# Patient Record
Sex: Female | Born: 1945 | Race: White | Hispanic: No | Smoking: Never smoker
Health system: Southern US, Community
[De-identification: ages and names within clinical notes are randomized; demographics above are authoritative.]

## PROBLEM LIST (undated history)

## (undated) DIAGNOSIS — N6019 Diffuse cystic mastopathy of unspecified breast: Secondary | ICD-10-CM

## (undated) DIAGNOSIS — I10 Essential (primary) hypertension: Secondary | ICD-10-CM

## (undated) DIAGNOSIS — T7840XA Allergy, unspecified, initial encounter: Secondary | ICD-10-CM

## (undated) DIAGNOSIS — C801 Malignant (primary) neoplasm, unspecified: Secondary | ICD-10-CM

## (undated) DIAGNOSIS — N3281 Overactive bladder: Secondary | ICD-10-CM

## (undated) DIAGNOSIS — G43909 Migraine, unspecified, not intractable, without status migrainosus: Secondary | ICD-10-CM

## (undated) DIAGNOSIS — E7800 Pure hypercholesterolemia, unspecified: Secondary | ICD-10-CM

## (undated) DIAGNOSIS — R32 Unspecified urinary incontinence: Secondary | ICD-10-CM

## (undated) DIAGNOSIS — M1990 Unspecified osteoarthritis, unspecified site: Secondary | ICD-10-CM

## (undated) HISTORY — DX: Overactive bladder: N32.81

## (undated) HISTORY — DX: Malignant (primary) neoplasm, unspecified: C80.1

## (undated) HISTORY — DX: Allergy, unspecified, initial encounter: T78.40XA

## (undated) HISTORY — DX: Diffuse cystic mastopathy of unspecified breast: N60.19

## (undated) HISTORY — DX: Essential (primary) hypertension: I10

## (undated) HISTORY — DX: Pure hypercholesterolemia, unspecified: E78.00

## (undated) HISTORY — DX: Unspecified osteoarthritis, unspecified site: M19.90

## (undated) HISTORY — DX: Migraine, unspecified, not intractable, without status migrainosus: G43.909

## (undated) HISTORY — DX: Unspecified urinary incontinence: R32

---

## 1989-07-18 DIAGNOSIS — I10 Essential (primary) hypertension: Secondary | ICD-10-CM

## 1989-07-18 HISTORY — DX: Essential (primary) hypertension: I10

## 1993-05-31 HISTORY — PX: BREAST BIOPSY: SHX20

## 1996-11-17 HISTORY — PX: LITHOTRIPSY: SUR834

## 1996-11-17 HISTORY — PX: KIDNEY SURGERY: SHX687

## 2004-11-17 HISTORY — PX: COLONOSCOPY: SHX174

## 2004-12-16 ENCOUNTER — Ambulatory Visit: Payer: Self-pay | Admitting: General Surgery

## 2004-12-18 LAB — HM COLONOSCOPY: HM COLON: NORMAL

## 2005-06-03 ENCOUNTER — Ambulatory Visit: Payer: Self-pay | Admitting: General Surgery

## 2005-08-22 LAB — HM PAP SMEAR: HM Pap smear: NEGATIVE

## 2006-09-15 ENCOUNTER — Ambulatory Visit: Payer: Self-pay

## 2007-09-28 ENCOUNTER — Ambulatory Visit: Payer: Self-pay

## 2008-11-01 ENCOUNTER — Ambulatory Visit: Payer: Self-pay

## 2009-12-04 ENCOUNTER — Ambulatory Visit: Payer: Self-pay

## 2010-12-11 ENCOUNTER — Ambulatory Visit: Payer: Self-pay

## 2011-11-18 DIAGNOSIS — N6019 Diffuse cystic mastopathy of unspecified breast: Secondary | ICD-10-CM

## 2011-11-18 HISTORY — DX: Diffuse cystic mastopathy of unspecified breast: N60.19

## 2011-12-17 ENCOUNTER — Ambulatory Visit: Payer: Self-pay

## 2012-12-25 ENCOUNTER — Encounter: Payer: Self-pay | Admitting: *Deleted

## 2012-12-25 DIAGNOSIS — N6019 Diffuse cystic mastopathy of unspecified breast: Secondary | ICD-10-CM | POA: Insufficient documentation

## 2012-12-25 DIAGNOSIS — E7800 Pure hypercholesterolemia, unspecified: Secondary | ICD-10-CM | POA: Insufficient documentation

## 2012-12-25 DIAGNOSIS — G43909 Migraine, unspecified, not intractable, without status migrainosus: Secondary | ICD-10-CM | POA: Insufficient documentation

## 2012-12-25 DIAGNOSIS — I10 Essential (primary) hypertension: Secondary | ICD-10-CM | POA: Insufficient documentation

## 2013-02-03 LAB — HM PAP SMEAR: HM PAP: NEGATIVE

## 2013-02-14 ENCOUNTER — Encounter: Payer: Self-pay | Admitting: General Surgery

## 2013-02-15 ENCOUNTER — Ambulatory Visit: Payer: Self-pay | Admitting: General Surgery

## 2013-02-16 ENCOUNTER — Encounter: Payer: Self-pay | Admitting: General Surgery

## 2013-02-28 ENCOUNTER — Ambulatory Visit (INDEPENDENT_AMBULATORY_CARE_PROVIDER_SITE_OTHER): Payer: Medicare Other | Admitting: General Surgery

## 2013-02-28 ENCOUNTER — Other Ambulatory Visit: Payer: Self-pay | Admitting: *Deleted

## 2013-02-28 VITALS — BP 120/80 | HR 80 | Resp 12 | Ht 65.0 in | Wt 291.0 lb

## 2013-02-28 DIAGNOSIS — Z1231 Encounter for screening mammogram for malignant neoplasm of breast: Secondary | ICD-10-CM

## 2013-02-28 DIAGNOSIS — N6019 Diffuse cystic mastopathy of unspecified breast: Secondary | ICD-10-CM

## 2013-02-28 NOTE — Progress Notes (Signed)
Patient ID: Felicia Vargas, female   DOB: 11/22/1945, 67 y.o.   MRN: [id number]  Chief Complaint  Patient presents with  . Follow-up    mammogram    HPI Felicia Vargas is a 67 y.o. female here today for a follow up mammogram 02/15/13 cat 1. Patient states she has no new breast problems.Patient has had a history of FCD. HPI  Past Medical History  Diagnosis Date  . Hypertension 1990's  . Diffuse cystic mastopathy 2013  . Migraines   . High cholesterol     Past Surgical History  Procedure Laterality Date  . Colonoscopy  2006    Dr. Trinda Pascal  . Kidney surgery  1998  . Breast biopsy Right 1994  . Lithotripsy  1998    Family History  Problem Relation Age of Onset  . Lung cancer Father     Social History History  Substance Use Topics  . Smoking status: Never Smoker   . Smokeless tobacco: Not on file  . Alcohol Use: No    No Known Allergies  Current Outpatient Prescriptions  Medication Sig Dispense Refill  . aspirin 81 MG tablet Take 81 mg by mouth daily.      . butalbital-acetaminophen-caffeine (FIORICET WITH CODEINE) 50-325-40-30 MG per capsule Take 1 capsule by mouth every 4 (four) hours as needed for headache.      . calcium carbonate (OS-CAL) 600 MG TABS Take 600 mg by mouth 2 (two) times daily with a meal.      . fluticasone (FLONASE) 50 MCG/ACT nasal spray 50 sprays daily.      Marland Kitchen lisinopril-hydrochlorothiazide (PRINZIDE,ZESTORETIC) 20-25 MG per tablet Take 1 tablet by mouth daily.      . Multiple Vitamin (MULTIVITAMIN) tablet Take 1 tablet by mouth daily.      Marland Kitchen oxybutynin (DITROPAN) 5 MG tablet Take 5 mg by mouth.      . pravastatin (PRAVACHOL) 20 MG tablet Take 20 mg by mouth daily.       No current facility-administered medications for this visit.    Review of Systems Review of Systems  Constitutional: Negative.   Respiratory: Negative.   Cardiovascular: Negative.     Blood pressure 120/80, pulse 80, resp. rate 12, height 5\' 5"  (1.651 m), weight 291 lb  (131.997 kg).  Physical Exam Physical Exam  Constitutional: She appears well-developed and well-nourished.  Eyes: Conjunctivae are normal. No scleral icterus.  Neck: Normal range of motion. Neck supple.  Cardiovascular: Normal rate, regular rhythm and normal heart sounds.   Pulmonary/Chest: Effort normal and breath sounds normal. Right breast exhibits no inverted nipple, no mass, no nipple discharge, no skin change and no tenderness. Left breast exhibits no inverted nipple, no mass, no nipple discharge, no skin change and no tenderness.  Abdominal: Soft. Bowel sounds are normal.  Lymphadenopathy:    She has no cervical adenopathy.    She has no axillary adenopathy.    Data Reviewed Mammogram reviewed-BIRADS 2.   Assessment    Stable exam     Plan    Continue BSE and yearly f/u.        Lynore Coscia G 03/01/2013, 11:46 AM

## 2013-02-28 NOTE — Patient Instructions (Addendum)
Return in one year with bilateral screening mammogram.

## 2013-02-28 NOTE — Progress Notes (Signed)
Patient will be asked to return to the office in one year for a bilateral screening mammogram. This patient wishes to have this completed at [facility].

## 2013-03-01 ENCOUNTER — Encounter: Payer: Self-pay | Admitting: General Surgery

## 2014-02-16 ENCOUNTER — Ambulatory Visit: Payer: Self-pay | Admitting: General Surgery

## 2014-02-27 ENCOUNTER — Ambulatory Visit: Payer: Medicare Other | Admitting: General Surgery

## 2014-02-27 ENCOUNTER — Ambulatory Visit (INDEPENDENT_AMBULATORY_CARE_PROVIDER_SITE_OTHER): Payer: Medicare Other | Admitting: General Surgery

## 2014-02-27 ENCOUNTER — Encounter: Payer: Self-pay | Admitting: General Surgery

## 2014-02-27 VITALS — BP 138/80 | HR 72 | Resp 18 | Ht 66.0 in | Wt 284.0 lb

## 2014-02-27 DIAGNOSIS — N6019 Diffuse cystic mastopathy of unspecified breast: Secondary | ICD-10-CM

## 2014-02-27 DIAGNOSIS — Z1231 Encounter for screening mammogram for malignant neoplasm of breast: Secondary | ICD-10-CM

## 2014-02-27 NOTE — Patient Instructions (Addendum)
Patient to return in one year bilateral screening mammogram .Continue self breast exams. Call office for any new breast issues or concerns.  

## 2014-02-27 NOTE — Progress Notes (Signed)
Patient ID: Felicia Vargas, female   DOB: 12-07-1945, 68 y.o.   MRN: [id number]  Chief Complaint  Patient presents with  . Follow-up    mammogram    HPI Felicia Vargas is a 68 y.o. female who presents for a breast evaluation. The most recent mammogram was done on 02/16/14. Patient does perform regular self breast checks and gets regular mammograms done.  The patient denies any new problems with the breasts at this time.   HPI. History of FCD. Past Medical History  Diagnosis Date  . Hypertension 1990's  . Diffuse cystic mastopathy 2013  . Migraines   . High cholesterol     Past Surgical History  Procedure Laterality Date  . Colonoscopy  2006    Dr. Chauncey Reading  . Kidney surgery  1998  . Breast biopsy Right 1994  . Lithotripsy  1998    Family History  Problem Relation Age of Onset  . Lung cancer Father     Social History History  Substance Use Topics  . Smoking status: Never Smoker   . Smokeless tobacco: Not on file  . Alcohol Use: No    No Known Allergies  Current Outpatient Prescriptions  Medication Sig Dispense Refill  . aspirin 81 MG tablet Take 81 mg by mouth daily.      . butalbital-acetaminophen-caffeine (FIORICET WITH CODEINE) 50-325-40-30 MG per capsule Take 1 capsule by mouth every 4 (four) hours as needed for headache.      . calcium carbonate (OS-CAL) 600 MG TABS Take 600 mg by mouth 2 (two) times daily with a meal.      . Cholecalciferol (VITAMIN D PO) Take 1 capsule by mouth daily.      . fluticasone (FLONASE) 50 MCG/ACT nasal spray 50 sprays daily.      Marland Kitchen lisinopril-hydrochlorothiazide (PRINZIDE,ZESTORETIC) 20-25 MG per tablet Take 1 tablet by mouth daily.      Marland Kitchen loratadine (CLARITIN) 10 MG tablet Take 10 mg by mouth daily.      . Multiple Vitamin (MULTIVITAMIN) tablet Take 1 tablet by mouth daily.      . naproxen sodium (ANAPROX) 220 MG tablet Take 220 mg by mouth 2 (two) times daily with a meal.      . oxybutynin (DITROPAN) 5 MG tablet Take 5 mg by  mouth.      . pravastatin (PRAVACHOL) 20 MG tablet Take 20 mg by mouth daily.       No current facility-administered medications for this visit.    Review of Systems Review of Systems  Constitutional: Negative.   Respiratory: Negative.   Cardiovascular: Negative.     Blood pressure 138/80, pulse 72, resp. rate 18, height 5\' 6"  (1.676 m), weight 284 lb (128.822 kg).  Physical Exam Physical Exam  Constitutional: She is oriented to person, place, and time. She appears well-developed and well-nourished.  Eyes: Conjunctivae are normal.  Neck: Neck supple.  Cardiovascular: Normal rate, regular rhythm and normal heart sounds.   Pulmonary/Chest: Breath sounds normal. Right breast exhibits no inverted nipple, no mass, no nipple discharge, no skin change and no tenderness. Left breast exhibits no inverted nipple, no mass, no nipple discharge, no skin change and no tenderness.  Abdominal: Bowel sounds are normal. There is no tenderness.  Lymphadenopathy:    She has no cervical adenopathy.    She has no axillary adenopathy.  Neurological: She is alert and oriented to person, place, and time.  Skin: Skin is warm and dry.    Data Reviewed Mammogram  reviewed    Assessment    Stable exam   Hstory of FCD Plan    Patient to return in one year with bilateral screening mammogram.       Faron Tudisco G Shephanie Romas 02/28/2014, 6:37 PM

## 2014-02-28 ENCOUNTER — Encounter: Payer: Self-pay | Admitting: General Surgery

## 2014-09-18 ENCOUNTER — Encounter: Payer: Self-pay | Admitting: General Surgery

## 2015-02-20 ENCOUNTER — Ambulatory Visit
Admit: 2015-02-20 | Disposition: A | Discharge status: Home / Self Care | Payer: Self-pay | Attending: General Surgery | Admitting: General Surgery

## 2015-02-21 ENCOUNTER — Encounter: Payer: Self-pay | Admitting: General Surgery

## 2015-02-27 ENCOUNTER — Encounter: Payer: Self-pay | Admitting: General Surgery

## 2015-02-27 ENCOUNTER — Ambulatory Visit: Payer: Self-pay | Admitting: General Surgery

## 2015-02-27 ENCOUNTER — Ambulatory Visit (INDEPENDENT_AMBULATORY_CARE_PROVIDER_SITE_OTHER): Payer: PPO | Admitting: General Surgery

## 2015-02-27 VITALS — BP 142/68 | HR 74 | Resp 12 | Ht 66.0 in | Wt 282.0 lb

## 2015-02-27 DIAGNOSIS — N6019 Diffuse cystic mastopathy of unspecified breast: Secondary | ICD-10-CM | POA: Diagnosis not present

## 2015-02-27 DIAGNOSIS — Z1211 Encounter for screening for malignant neoplasm of colon: Secondary | ICD-10-CM

## 2015-02-27 NOTE — Patient Instructions (Addendum)
Patient will be asked to return to the office in one year with a bilateral screening mammogram. Continue self breast exams. Call office for any new breast issues or concerns. Colonoscopy A colonoscopy is an exam to look at the entire large intestine (colon). This exam can help find problems such as tumors, polyps, inflammation, and areas of bleeding. The exam takes about 1 hour.  LET [facility] CARE PROVIDER KNOW ABOUT:   Any allergies you have.  All medicines you are taking, including vitamins, herbs, eye drops, creams, and over-the-counter medicines.  Previous problems you or members of your family have had with the use of anesthetics.  Any blood disorders you have.  Previous surgeries you have had.  Medical conditions you have. RISKS AND COMPLICATIONS  Generally, this is a safe procedure. However, as with any procedure, complications can occur. Possible complications include:  Bleeding.  Tearing or rupture of the colon wall.  Reaction to medicines given during the exam.  Infection (rare). BEFORE THE PROCEDURE   Ask your health care provider about changing or stopping your regular medicines.  You may be prescribed an oral bowel prep. This involves drinking a large amount of medicated liquid, starting the day before your procedure. The liquid will cause you to have multiple loose stools until your stool is almost clear or light green. This cleans out your colon in preparation for the procedure.  Do not eat or drink anything else once you have started the bowel prep, unless your health care provider tells you it is safe to do so.  Arrange for someone to drive you home after the procedure. PROCEDURE   You will be given medicine to help you relax (sedative).  You will lie on your side with your knees bent.  A long, flexible tube with a light and camera on the end (colonoscope) will be inserted through the rectum and into the colon. The camera sends video back to a computer  screen as it moves through the colon. The colonoscope also releases carbon dioxide gas to inflate the colon. This helps your health care provider see the area better.  During the exam, your health care provider may take a small tissue sample (biopsy) to be examined under a microscope if any abnormalities are found.  The exam is finished when the entire colon has been viewed. AFTER THE PROCEDURE   Do not drive for 24 hours after the exam.  You may have a small amount of blood in your stool.  You may pass moderate amounts of gas and have mild abdominal cramping or bloating. This is caused by the gas used to inflate your colon during the exam.  Ask when your test results will be ready and how you will get your results. Make sure you get your test results. Document Released: 10/31/2000 Document Revised: 08/24/2013 Document Reviewed: 07/11/2013 [facility] Patient Information 2015 [place], [place]. This information is not intended to replace advice given to you by your health care provider. Make sure you discuss any questions you have with your health care provider.

## 2015-02-27 NOTE — Progress Notes (Signed)
Patient ID: Felicia Vargas, female   DOB: August 18, 1946, 69 y.o.   MRN: [id number]  Chief Complaint  Patient presents with  . Follow-up    mammogram    HPI Felicia Vargas is a 69 y.o. female who presents for a breast evaluation. The most recent mammogram was done on 02/16/15.  Patient does perform regular self breast checks and gets regular mammograms done.    HPI  Past Medical History  Diagnosis Date  . Hypertension 1990's  . Diffuse cystic mastopathy 2013  . Migraines   . High cholesterol     Past Surgical History  Procedure Laterality Date  . Colonoscopy  11/2004    Dr. Chauncey Reading  . Kidney surgery  1998  . Breast biopsy Right 1994  . Lithotripsy  1998    Family History  Problem Relation Age of Onset  . Lung cancer Father     Social History History  Substance Use Topics  . Smoking status: Never Smoker   . Smokeless tobacco: Not on file  . Alcohol Use: No    No Known Allergies  Current Outpatient Prescriptions  Medication Sig Dispense Refill  . aspirin 81 MG tablet Take 81 mg by mouth daily.    . butalbital-acetaminophen-caffeine (FIORICET WITH CODEINE) 50-325-40-30 MG per capsule Take 1 capsule by mouth every 4 (four) hours as needed for headache.    . calcium carbonate (OS-CAL) 600 MG TABS Take 600 mg by mouth 2 (two) times daily with a meal.    . Cholecalciferol (VITAMIN D PO) Take 1 capsule by mouth daily.    . cyclobenzaprine (FLEXERIL) 10 MG tablet   1  . fluticasone (FLONASE) 50 MCG/ACT nasal spray 50 sprays daily.    Marland Kitchen lisinopril-hydrochlorothiazide (PRINZIDE,ZESTORETIC) 20-25 MG per tablet Take 1 tablet by mouth daily.    Marland Kitchen loratadine (CLARITIN) 10 MG tablet Take 10 mg by mouth daily.    . Multiple Vitamin (MULTIVITAMIN) tablet Take 1 tablet by mouth daily.    . naproxen sodium (ANAPROX) 220 MG tablet Take 220 mg by mouth 2 (two) times daily with a meal.    . oxybutynin (DITROPAN) 5 MG tablet Take 5 mg by mouth.    . pravastatin (PRAVACHOL) 20 MG  tablet Take 20 mg by mouth daily.     No current facility-administered medications for this visit.    Review of Systems Review of Systems  Constitutional: Negative.   Respiratory: Negative.   Cardiovascular: Negative.     Blood pressure 142/68, pulse 74, resp. rate 12, height 5\' 6"  (1.676 m), weight 282 lb (127.914 kg).  Physical Exam Physical Exam  Constitutional: She is oriented to person, place, and time. She appears well-developed and well-nourished.  Eyes: Conjunctivae are normal. No scleral icterus.  Neck: Neck supple.  Cardiovascular: Normal rate, regular rhythm and normal heart sounds.   Pulmonary/Chest: Effort normal and breath sounds normal. Right breast exhibits no inverted nipple, no mass, no nipple discharge, no skin change and no tenderness. Left breast exhibits no inverted nipple, no mass, no nipple discharge, no skin change and no tenderness.  Abdominal: Soft. Bowel sounds are normal. There is no tenderness.  Lymphadenopathy:    She has no cervical adenopathy.    She has no axillary adenopathy.  Neurological: She is alert and oriented to person, place, and time.  Skin: Skin is warm and dry.    Data Reviewed Mammogram reviewed   Assessment    Stable exam. History of FCD. Due for screening colonoscopy  Plan    Discussed colonoscopy, patient agrees.   Patient will be asked to return to the office in one year with a bilateral screening mammogram.  PCP:  Mathews Robinsons G 02/27/2015, 3:02 PM

## 2015-02-28 ENCOUNTER — Telehealth: Payer: Self-pay | Admitting: *Deleted

## 2015-02-28 MED ORDER — POLYETHYLENE GLYCOL 3350 17 GM/SCOOP PO POWD: ORAL | Status: DC

## 2015-02-28 NOTE — Telephone Encounter (Signed)
Patient has been scheduled for a colonoscopy on 04-25-15 at [facility]. It is okay for patient to continue 81 mg aspirin once daily.   Miralax prescription has been sent in to patient pharmacy.

## 2015-03-13 ENCOUNTER — Telehealth: Payer: Self-pay

## 2015-03-13 NOTE — Telephone Encounter (Signed)
Patient called to cancel her colonoscopy scheduled for 04/25/15. She will be changing insurances next year and may see about having it rescheduled for then. Trish in Endoscopy notified of cancellation.

## 2015-03-15 ENCOUNTER — Encounter: Payer: Self-pay | Admitting: General Surgery

## 2015-04-25 ENCOUNTER — Encounter: Admission: RE | Payer: Self-pay | Source: Ambulatory Visit

## 2015-04-25 ENCOUNTER — Ambulatory Visit: Admission: RE | Admit: 2015-04-25 | Payer: Self-pay | Source: Ambulatory Visit | Admitting: General Surgery

## 2015-04-25 SURGERY — COLONOSCOPY
Anesthesia: General

## 2015-07-16 ENCOUNTER — Ambulatory Visit (INDEPENDENT_AMBULATORY_CARE_PROVIDER_SITE_OTHER): Payer: PPO | Admitting: Family Medicine

## 2015-07-16 ENCOUNTER — Ambulatory Visit: Payer: Self-pay | Admitting: Family Medicine

## 2015-07-16 ENCOUNTER — Encounter: Payer: Self-pay | Admitting: Family Medicine

## 2015-07-16 VITALS — BP 138/73 | HR 72 | Temp 98.0°F | Ht 64.0 in | Wt 270.6 lb

## 2015-07-16 DIAGNOSIS — E7800 Pure hypercholesterolemia, unspecified: Secondary | ICD-10-CM

## 2015-07-16 DIAGNOSIS — E785 Hyperlipidemia, unspecified: Secondary | ICD-10-CM | POA: Diagnosis not present

## 2015-07-16 DIAGNOSIS — J309 Allergic rhinitis, unspecified: Secondary | ICD-10-CM | POA: Diagnosis not present

## 2015-07-16 DIAGNOSIS — I10 Essential (primary) hypertension: Secondary | ICD-10-CM | POA: Diagnosis not present

## 2015-07-16 MED ORDER — FLUTICASONE PROPIONATE 50 MCG/ACT NA SUSP: 50.0000 | Freq: Every day | NASAL | Status: DC

## 2015-07-16 NOTE — Assessment & Plan Note (Signed)
Under good control. Check microalbumin and CMP early next week as she has eaten today. Continue current regimen. Continue to monitor.

## 2015-07-16 NOTE — Progress Notes (Signed)
BP 138/73 mmHg  Pulse 72  Temp(Src) 98 F (36.7 C)  Ht 5\' 4"  (1.626 m)  Wt 270 lb 9.6 oz (122.743 kg)  BMI 46.43 kg/m2  SpO2 96%   Subjective:    Patient ID: Felicia Vargas, female    DOB: [birth year], 69 y.o.   MRN: [id number]  HPI: Felicia Vargas is a 69 y.o. female who presents today to establish care.   Chief Complaint  Patient presents with  . Allergies  . Hypertension  . Hyperlipidemia   HYPERTENSION / HYPERLIPIDEMIA Satisfied with current treatment? yes Duration of hypertension: chronic BP monitoring frequency: a few times a week BP medication side effects: yes Duration of hyperlipidemia: chronic Cholesterol medication side effects: no Cholesterol supplements: none Medication compliance: excellent compliance Aspirin: yes Recent stressors: no Recurrent headaches: yes- has migraines, has not had one in quite a while Visual changes: no Palpitations: no Dyspnea: no Chest pain: no Lower extremity edema: no Dizzy/lightheaded: no  Relevant past medical, surgical, family and social history reviewed and updated as indicated. Interim medical history since our last visit reviewed. Allergies and medications reviewed and updated.  Review of Systems  Constitutional: Negative.   Respiratory: Negative.   Cardiovascular: Negative.   Skin: Negative.   Psychiatric/Behavioral: Negative.     Per HPI unless specifically indicated above     Objective:    BP 138/73 mmHg  Pulse 72  Temp(Src) 98 F (36.7 C)  Ht 5\' 4"  (1.626 m)  Wt 270 lb 9.6 oz (122.743 kg)  BMI 46.43 kg/m2  SpO2 96%  Wt Readings from Last 3 Encounters:  07/16/15 270 lb 9.6 oz (122.743 kg)  02/27/15 282 lb (127.914 kg)  02/27/14 284 lb (128.822 kg)    Physical Exam  Constitutional: She is oriented to person, place, and time. She appears well-developed and well-nourished. No distress.  HENT:  Head: Normocephalic and atraumatic.  Right Ear: Hearing normal.  Left Ear: Hearing normal.   Nose: Nose normal.  Eyes: Conjunctivae and lids are normal. Right eye exhibits no discharge. Left eye exhibits no discharge. No scleral icterus.  Cardiovascular: Normal rate, regular rhythm, normal heart sounds and intact distal pulses.  Exam reveals no gallop and no friction rub.   No murmur heard. Pulmonary/Chest: Effort normal and breath sounds normal. No respiratory distress. She has no wheezes. She has no rales. She exhibits no tenderness.  Musculoskeletal: Normal range of motion.  Neurological: She is alert and oriented to person, place, and time.  Skin: Skin is warm, dry and intact. No rash noted. No erythema. No pallor.  Psychiatric: She has a normal mood and affect. Her speech is normal and behavior is normal. Judgment and thought content normal. Cognition and memory are normal.  Nursing note and vitals reviewed.   No results found for this or any previous visit.    Assessment & Plan:   Problem List Items Addressed This Visit      Cardiovascular and Mediastinum   Hypertension - Primary    Under good control. Check microalbumin and CMP early next week as she has eaten today. Continue current regimen. Continue to monitor.       Relevant Orders   Comprehensive metabolic panel   Microalbumin, Urine Waived     Respiratory   Rhinitis, allergic    Will refill her flonase today. Under good control. Continue current regimen. Continue to monitor.         Other   High cholesterol    Will check next week  as she has eaten today. Order in. Will adjust as needed. Follow up in 6 months.        Other Visit Diagnoses    HLD (hyperlipidemia)        Relevant Orders    Comprehensive metabolic panel    Lipid Panel Piccolo, Waived        Follow up plan: Return in about 6 months (around 01/15/2016) for Medicare Wellness, next week for lab draw.

## 2015-07-16 NOTE — Assessment & Plan Note (Signed)
Will refill her flonase today. Under good control. Continue current regimen. Continue to monitor.

## 2015-07-16 NOTE — Patient Instructions (Signed)
Preventive Care for Adults A healthy lifestyle and preventive care can promote health and wellness. Preventive health guidelines for women include the following key practices.  A routine yearly physical is a good way to check with your health care provider about your health and preventive screening. It is a chance to share any concerns and updates on your health and to receive a thorough exam.  Visit your dentist for a routine exam and preventive care every 6 months. Brush your teeth twice a day and floss once a day. Good oral hygiene prevents tooth decay and gum disease.  The frequency of eye exams is based on your age, health, family medical history, use of contact lenses, and other factors. Follow your health care provider's recommendations for frequency of eye exams.  Eat a healthy diet. Foods like vegetables, fruits, whole grains, low-fat dairy products, and lean protein foods contain the nutrients you need without too many calories. Decrease your intake of foods high in solid fats, added sugars, and salt. Eat the right amount of calories for you.Get information about a proper diet from your health care provider, if necessary.  Regular physical exercise is one of the most important things you can do for your health. Most adults should get at least 150 minutes of moderate-intensity exercise (any activity that increases your heart rate and causes you to sweat) each week. In addition, most adults need muscle-strengthening exercises on 2 or more days a week.  Maintain a healthy weight. The body mass index (BMI) is a screening tool to identify possible weight problems. It provides an estimate of body fat based on height and weight. Your health care provider can find your BMI and can help you achieve or maintain a healthy weight.For adults 20 years and older:  A BMI below 18.5 is considered underweight.  A BMI of 18.5 to 24.9 is normal.  A BMI of 25 to 29.9 is considered overweight.  A BMI of  30 and above is considered obese.  Maintain normal blood lipids and cholesterol levels by exercising and minimizing your intake of saturated fat. Eat a balanced diet with plenty of fruit and vegetables. Blood tests for lipids and cholesterol should begin at age 76 and be repeated every 5 years. If your lipid or cholesterol levels are high, you are over 50, or you are at high risk for heart disease, you may need your cholesterol levels checked more frequently.Ongoing high lipid and cholesterol levels should be treated with medicines if diet and exercise are not working.  If you smoke, find out from your health care provider how to quit. If you do not use tobacco, do not start.  Lung cancer screening is recommended for adults aged 22-80 years who are at high risk for developing lung cancer because of a history of smoking. A yearly low-dose CT scan of the lungs is recommended for people who have at least a 30-pack-year history of smoking and are a current smoker or have quit within the past 15 years. A pack year of smoking is smoking an average of 1 pack of cigarettes a day for 1 year (for example: 1 pack a day for 30 years or 2 packs a day for 15 years). Yearly screening should continue until the smoker has stopped smoking for at least 15 years. Yearly screening should be stopped for people who develop a health problem that would prevent them from having lung cancer treatment.  If you are pregnant, do not drink alcohol. If you are breastfeeding,  be very cautious about drinking alcohol. If you are not pregnant and choose to drink alcohol, do not have more than 1 drink per day. One drink is considered to be 12 ounces (355 mL) of beer, 5 ounces (148 mL) of wine, or 1.5 ounces (44 mL) of liquor.  Avoid use of street drugs. Do not share needles with anyone. Ask for help if you need support or instructions about stopping the use of drugs.  High blood pressure causes heart disease and increases the risk of  stroke. Your blood pressure should be checked at least every 1 to 2 years. Ongoing high blood pressure should be treated with medicines if weight loss and exercise do not work.  If you are 3-86 years old, ask your health care provider if you should take aspirin to prevent strokes.  Diabetes screening involves taking a blood sample to check your fasting blood sugar level. This should be done once every 3 years, after age 67, if you are within normal weight and without risk factors for diabetes. Testing should be considered at a younger age or be carried out more frequently if you are overweight and have at least 1 risk factor for diabetes.  Breast cancer screening is essential preventive care for women. You should practice "breast self-awareness." This means understanding the normal appearance and feel of your breasts and may include breast self-examination. Any changes detected, no matter how small, should be reported to a health care provider. Women in their 8s and 30s should have a clinical breast exam (CBE) by a health care provider as part of a regular health exam every 1 to 3 years. After age 70, women should have a CBE every year. Starting at age 25, women should consider having a mammogram (breast X-ray test) every year. Women who have a family history of breast cancer should talk to their health care provider about genetic screening. Women at a high risk of breast cancer should talk to their health care providers about having an MRI and a mammogram every year.  Breast cancer gene (BRCA)-related cancer risk assessment is recommended for women who have family members with BRCA-related cancers. BRCA-related cancers include breast, ovarian, tubal, and peritoneal cancers. Having family members with these cancers may be associated with an increased risk for harmful changes (mutations) in the breast cancer genes BRCA1 and BRCA2. Results of the assessment will determine the need for genetic counseling and  BRCA1 and BRCA2 testing.  Routine pelvic exams to screen for cancer are no longer recommended for nonpregnant women who are considered low risk for cancer of the pelvic organs (ovaries, uterus, and vagina) and who do not have symptoms. Ask your health care provider if a screening pelvic exam is right for you.  If you have had past treatment for cervical cancer or a condition that could lead to cancer, you need Pap tests and screening for cancer for at least 20 years after your treatment. If Pap tests have been discontinued, your risk factors (such as having a new sexual partner) need to be reassessed to determine if screening should be resumed. Some women have medical problems that increase the chance of getting cervical cancer. In these cases, your health care provider may recommend more frequent screening and Pap tests.  The HPV test is an additional test that may be used for cervical cancer screening. The HPV test looks for the virus that can cause the cell changes on the cervix. The cells collected during the Pap test can be  tested for HPV. The HPV test could be used to screen women aged 30 years and older, and should be used in women of any age who have unclear Pap test results. After the age of 30, women should have HPV testing at the same frequency as a Pap test.  Colorectal cancer can be detected and often prevented. Most routine colorectal cancer screening begins at the age of 50 years and continues through age 75 years. However, your health care provider may recommend screening at an earlier age if you have risk factors for colon cancer. On a yearly basis, your health care provider may provide home test kits to check for hidden blood in the stool. Use of a small camera at the end of a tube, to directly examine the colon (sigmoidoscopy or colonoscopy), can detect the earliest forms of colorectal cancer. Talk to your health care provider about this at age 50, when routine screening begins. Direct  exam of the colon should be repeated every 5-10 years through age 75 years, unless early forms of pre-cancerous polyps or small growths are found.  People who are at an increased risk for hepatitis B should be screened for this virus. You are considered at high risk for hepatitis B if:  You were born in a country where hepatitis B occurs often. Talk with your health care provider about which countries are considered high risk.  Your parents were born in a high-risk country and you have not received a shot to protect against hepatitis B (hepatitis B vaccine).  You have HIV or AIDS.  You use needles to inject street drugs.  You live with, or have sex with, someone who has hepatitis B.  You get hemodialysis treatment.  You take certain medicines for conditions like cancer, organ transplantation, and autoimmune conditions.  Hepatitis C blood testing is recommended for all people born from 1945 through 1965 and any individual with known risks for hepatitis C.  Practice safe sex. Use condoms and avoid high-risk sexual practices to reduce the spread of sexually transmitted infections (STIs). STIs include gonorrhea, chlamydia, syphilis, trichomonas, herpes, HPV, and human immunodeficiency virus (HIV). Herpes, HIV, and HPV are viral illnesses that have no cure. They can result in disability, cancer, and death.  You should be screened for sexually transmitted illnesses (STIs) including gonorrhea and chlamydia if:  You are sexually active and are younger than 24 years.  You are older than 24 years and your health care provider tells you that you are at risk for this type of infection.  Your sexual activity has changed since you were last screened and you are at an increased risk for chlamydia or gonorrhea. Ask your health care provider if you are at risk.  If you are at risk of being infected with HIV, it is recommended that you take a prescription medicine daily to prevent HIV infection. This is  called preexposure prophylaxis (PrEP). You are considered at risk if:  You are a heterosexual woman, are sexually active, and are at increased risk for HIV infection.  You take drugs by injection.  You are sexually active with a partner who has HIV.  Talk with your health care provider about whether you are at high risk of being infected with HIV. If you choose to begin PrEP, you should first be tested for HIV. You should then be tested every 3 months for as long as you are taking PrEP.  Osteoporosis is a disease in which the bones lose minerals and strength   with aging. This can result in serious bone fractures or breaks. The risk of osteoporosis can be identified using a bone density scan. Women ages 65 years and over and women at risk for fractures or osteoporosis should discuss screening with their health care providers. Ask your health care provider whether you should take a calcium supplement or vitamin D to reduce the rate of osteoporosis.  Menopause can be associated with physical symptoms and risks. Hormone replacement therapy is available to decrease symptoms and risks. You should talk to your health care provider about whether hormone replacement therapy is right for you.  Use sunscreen. Apply sunscreen liberally and repeatedly throughout the day. You should seek shade when your shadow is shorter than you. Protect yourself by wearing long sleeves, pants, a wide-brimmed hat, and sunglasses year round, whenever you are outdoors.  Once a month, do a whole body skin exam, using a mirror to look at the skin on your back. Tell your health care provider of new moles, moles that have irregular borders, moles that are larger than a pencil eraser, or moles that have changed in shape or color.  Stay current with required vaccines (immunizations).  Influenza vaccine. All adults should be immunized every year.  Tetanus, diphtheria, and acellular pertussis (Td, Tdap) vaccine. Pregnant women should  receive 1 dose of Tdap vaccine during each pregnancy. The dose should be obtained regardless of the length of time since the last dose. Immunization is preferred during the 27th-36th week of gestation. An adult who has not previously received Tdap or who does not know her vaccine status should receive 1 dose of Tdap. This initial dose should be followed by tetanus and diphtheria toxoids (Td) booster doses every 10 years. Adults with an unknown or incomplete history of completing a 3-dose immunization series with Td-containing vaccines should begin or complete a primary immunization series including a Tdap dose. Adults should receive a Td booster every 10 years.  Varicella vaccine. An adult without evidence of immunity to varicella should receive 2 doses or a second dose if she has previously received 1 dose. Pregnant females who do not have evidence of immunity should receive the first dose after pregnancy. This first dose should be obtained before leaving the health care facility. The second dose should be obtained 4-8 weeks after the first dose.  Human papillomavirus (HPV) vaccine. Females aged 13-26 years who have not received the vaccine previously should obtain the 3-dose series. The vaccine is not recommended for use in pregnant females. However, pregnancy testing is not needed before receiving a dose. If a female is found to be pregnant after receiving a dose, no treatment is needed. In that case, the remaining doses should be delayed until after the pregnancy. Immunization is recommended for any person with an immunocompromised condition through the age of 26 years if she did not get any or all doses earlier. During the 3-dose series, the second dose should be obtained 4-8 weeks after the first dose. The third dose should be obtained 24 weeks after the first dose and 16 weeks after the second dose.  Zoster vaccine. One dose is recommended for adults aged 60 years or older unless certain conditions are  present.  Measles, mumps, and rubella (MMR) vaccine. Adults born before 1957 generally are considered immune to measles and mumps. Adults born in 1957 or later should have 1 or more doses of MMR vaccine unless there is a contraindication to the vaccine or there is laboratory evidence of immunity to   each of the three diseases. A routine second dose of MMR vaccine should be obtained at least 28 days after the first dose for students attending postsecondary schools, health care workers, or international travelers. People who received inactivated measles vaccine or an unknown type of measles vaccine during 1963-1967 should receive 2 doses of MMR vaccine. People who received inactivated mumps vaccine or an unknown type of mumps vaccine before 1979 and are at high risk for mumps infection should consider immunization with 2 doses of MMR vaccine. For females of childbearing age, rubella immunity should be determined. If there is no evidence of immunity, females who are not pregnant should be vaccinated. If there is no evidence of immunity, females who are pregnant should delay immunization until after pregnancy. Unvaccinated health care workers born before 1957 who lack laboratory evidence of measles, mumps, or rubella immunity or laboratory confirmation of disease should consider measles and mumps immunization with 2 doses of MMR vaccine or rubella immunization with 1 dose of MMR vaccine.  Pneumococcal 13-valent conjugate (PCV13) vaccine. When indicated, a person who is uncertain of her immunization history and has no record of immunization should receive the PCV13 vaccine. An adult aged 19 years or older who has certain medical conditions and has not been previously immunized should receive 1 dose of PCV13 vaccine. This PCV13 should be followed with a dose of pneumococcal polysaccharide (PPSV23) vaccine. The PPSV23 vaccine dose should be obtained at least 8 weeks after the dose of PCV13 vaccine. An adult aged 19  years or older who has certain medical conditions and previously received 1 or more doses of PPSV23 vaccine should receive 1 dose of PCV13. The PCV13 vaccine dose should be obtained 1 or more years after the last PPSV23 vaccine dose.  Pneumococcal polysaccharide (PPSV23) vaccine. When PCV13 is also indicated, PCV13 should be obtained first. All adults aged 65 years and older should be immunized. An adult younger than age 65 years who has certain medical conditions should be immunized. Any person who resides in a nursing home or long-term care facility should be immunized. An adult smoker should be immunized. People with an immunocompromised condition and certain other conditions should receive both PCV13 and PPSV23 vaccines. People with human immunodeficiency virus (HIV) infection should be immunized as soon as possible after diagnosis. Immunization during chemotherapy or radiation therapy should be avoided. Routine use of PPSV23 vaccine is not recommended for American Indians, [place] Natives, or people younger than 65 years unless there are medical conditions that require PPSV23 vaccine. When indicated, people who have unknown immunization and have no record of immunization should receive PPSV23 vaccine. One-time revaccination 5 years after the first dose of PPSV23 is recommended for people aged 19-64 years who have chronic kidney failure, nephrotic syndrome, asplenia, or immunocompromised conditions. People who received 1-2 doses of PPSV23 before age 65 years should receive another dose of PPSV23 vaccine at age 65 years or later if at least 5 years have passed since the previous dose. Doses of PPSV23 are not needed for people immunized with PPSV23 at or after age 65 years.  Meningococcal vaccine. Adults with asplenia or persistent complement component deficiencies should receive 2 doses of quadrivalent meningococcal conjugate (MenACWY-D) vaccine. The doses should be obtained at least 2 months apart.  Microbiologists working with certain meningococcal bacteria, military recruits, people at risk during an outbreak, and people who travel to or live in countries with a high rate of meningitis should be immunized. A first-year college student up through age   21 years who is living in a residence hall should receive a dose if she did not receive a dose on or after her 16th birthday. Adults who have certain high-risk conditions should receive one or more doses of vaccine.  Hepatitis A vaccine. Adults who wish to be protected from this disease, have certain high-risk conditions, work with hepatitis A-infected animals, work in hepatitis A research labs, or travel to or work in countries with a high rate of hepatitis A should be immunized. Adults who were previously unvaccinated and who anticipate close contact with an international adoptee during the first 60 days after arrival in the [place] from a country with a high rate of hepatitis A should be immunized.  Hepatitis B vaccine. Adults who wish to be protected from this disease, have certain high-risk conditions, may be exposed to blood or other infectious body fluids, are household contacts or sex partners of hepatitis B positive people, are clients or workers in certain care facilities, or travel to or work in countries with a high rate of hepatitis B should be immunized.  Haemophilus influenzae type b (Hib) vaccine. A previously unvaccinated person with asplenia or sickle cell disease or having a scheduled splenectomy should receive 1 dose of Hib vaccine. Regardless of previous immunization, a recipient of a hematopoietic stem cell transplant should receive a 3-dose series 6-12 months after her successful transplant. Hib vaccine is not recommended for adults with HIV infection. Preventive Services / Frequency Ages 64 to 68 years  Blood pressure check.** / Every 1 to 2 years.  Lipid and cholesterol check.** / Every 5 years beginning at age  22.  Clinical breast exam.** / Every 3 years for women in their 88s and 53s.  BRCA-related cancer risk assessment.** / For women who have family members with a BRCA-related cancer (breast, ovarian, tubal, or peritoneal cancers).  Pap test.** / Every 2 years from ages 90 through 51. Every 3 years starting at age 21 through age 56 or 3 with a history of 3 consecutive normal Pap tests.  HPV screening.** / Every 3 years from ages 24 through ages 1 to 46 with a history of 3 consecutive normal Pap tests.  Hepatitis C blood test.** / For any individual with known risks for hepatitis C.  Skin self-exam. / Monthly.  Influenza vaccine. / Every year.  Tetanus, diphtheria, and acellular pertussis (Tdap, Td) vaccine.** / Consult your health care provider. Pregnant women should receive 1 dose of Tdap vaccine during each pregnancy. 1 dose of Td every 10 years.  Varicella vaccine.** / Consult your health care provider. Pregnant females who do not have evidence of immunity should receive the first dose after pregnancy.  HPV vaccine. / 3 doses over 6 months, if 72 and younger. The vaccine is not recommended for use in pregnant females. However, pregnancy testing is not needed before receiving a dose.  Measles, mumps, rubella (MMR) vaccine.** / You need at least 1 dose of MMR if you were born in 1957 or later. You may also need a 2nd dose. For females of childbearing age, rubella immunity should be determined. If there is no evidence of immunity, females who are not pregnant should be vaccinated. If there is no evidence of immunity, females who are pregnant should delay immunization until after pregnancy.  Pneumococcal 13-valent conjugate (PCV13) vaccine.** / Consult your health care provider.  Pneumococcal polysaccharide (PPSV23) vaccine.** / 1 to 2 doses if you smoke cigarettes or if you have certain conditions.  Meningococcal vaccine.** /  1 dose if you are age 19 to 21 years and a first-year college  student living in a residence hall, or have one of several medical conditions, you need to get vaccinated against meningococcal disease. You may also need additional booster doses.  Hepatitis A vaccine.** / Consult your health care provider.  Hepatitis B vaccine.** / Consult your health care provider.  Haemophilus influenzae type b (Hib) vaccine.** / Consult your health care provider. Ages 40 to 64 years  Blood pressure check.** / Every 1 to 2 years.  Lipid and cholesterol check.** / Every 5 years beginning at age 20 years.  Lung cancer screening. / Every year if you are aged 55-80 years and have a 30-pack-year history of smoking and currently smoke or have quit within the past 15 years. Yearly screening is stopped once you have quit smoking for at least 15 years or develop a health problem that would prevent you from having lung cancer treatment.  Clinical breast exam.** / Every year after age 40 years.  BRCA-related cancer risk assessment.** / For women who have family members with a BRCA-related cancer (breast, ovarian, tubal, or peritoneal cancers).  Mammogram.** / Every year beginning at age 40 years and continuing for as long as you are in good health. Consult with your health care provider.  Pap test.** / Every 3 years starting at age 30 years through age 65 or 70 years with a history of 3 consecutive normal Pap tests.  HPV screening.** / Every 3 years from ages 30 years through ages 65 to 70 years with a history of 3 consecutive normal Pap tests.  Fecal occult blood test (FOBT) of stool. / Every year beginning at age 50 years and continuing until age 75 years. You may not need to do this test if you get a colonoscopy every 10 years.  Flexible sigmoidoscopy or colonoscopy.** / Every 5 years for a flexible sigmoidoscopy or every 10 years for a colonoscopy beginning at age 50 years and continuing until age 75 years.  Hepatitis C blood test.** / For all people born from 1945 through  1965 and any individual with known risks for hepatitis C.  Skin self-exam. / Monthly.  Influenza vaccine. / Every year.  Tetanus, diphtheria, and acellular pertussis (Tdap/Td) vaccine.** / Consult your health care provider. Pregnant women should receive 1 dose of Tdap vaccine during each pregnancy. 1 dose of Td every 10 years.  Varicella vaccine.** / Consult your health care provider. Pregnant females who do not have evidence of immunity should receive the first dose after pregnancy.  Zoster vaccine.** / 1 dose for adults aged 60 years or older.  Measles, mumps, rubella (MMR) vaccine.** / You need at least 1 dose of MMR if you were born in 1957 or later. You may also need a 2nd dose. For females of childbearing age, rubella immunity should be determined. If there is no evidence of immunity, females who are not pregnant should be vaccinated. If there is no evidence of immunity, females who are pregnant should delay immunization until after pregnancy.  Pneumococcal 13-valent conjugate (PCV13) vaccine.** / Consult your health care provider.  Pneumococcal polysaccharide (PPSV23) vaccine.** / 1 to 2 doses if you smoke cigarettes or if you have certain conditions.  Meningococcal vaccine.** / Consult your health care provider.  Hepatitis A vaccine.** / Consult your health care provider.  Hepatitis B vaccine.** / Consult your health care provider.  Haemophilus influenzae type b (Hib) vaccine.** / Consult your health care provider. Ages 65   years and over  Blood pressure check.** / Every 1 to 2 years.  Lipid and cholesterol check.** / Every 5 years beginning at age 22 years.  Lung cancer screening. / Every year if you are aged 73-80 years and have a 30-pack-year history of smoking and currently smoke or have quit within the past 15 years. Yearly screening is stopped once you have quit smoking for at least 15 years or develop a health problem that would prevent you from having lung cancer  treatment.  Clinical breast exam.** / Every year after age 4 years.  BRCA-related cancer risk assessment.** / For women who have family members with a BRCA-related cancer (breast, ovarian, tubal, or peritoneal cancers).  Mammogram.** / Every year beginning at age 40 years and continuing for as long as you are in good health. Consult with your health care provider.  Pap test.** / Every 3 years starting at age 9 years through age 34 or 91 years with 3 consecutive normal Pap tests. Testing can be stopped between 65 and 70 years with 3 consecutive normal Pap tests and no abnormal Pap or HPV tests in the past 10 years.  HPV screening.** / Every 3 years from ages 57 years through ages 64 or 45 years with a history of 3 consecutive normal Pap tests. Testing can be stopped between 65 and 70 years with 3 consecutive normal Pap tests and no abnormal Pap or HPV tests in the past 10 years.  Fecal occult blood test (FOBT) of stool. / Every year beginning at age 15 years and continuing until age 17 years. You may not need to do this test if you get a colonoscopy every 10 years.  Flexible sigmoidoscopy or colonoscopy.** / Every 5 years for a flexible sigmoidoscopy or every 10 years for a colonoscopy beginning at age 86 years and continuing until age 71 years.  Hepatitis C blood test.** / For all people born from [birth year] through 1963 and any individual with known risks for hepatitis C.  Osteoporosis screening.** / A one-time screening for women ages 83 years and over and women at risk for fractures or osteoporosis.  Skin self-exam. / Monthly.  Influenza vaccine. / Every year.  Tetanus, diphtheria, and acellular pertussis (Tdap/Td) vaccine.** / 1 dose of Td every 10 years.  Varicella vaccine.** / Consult your health care provider.  Zoster vaccine.** / 1 dose for adults aged 61 years or older.  Pneumococcal 13-valent conjugate (PCV13) vaccine.** / Consult your health care provider.  Pneumococcal  polysaccharide (PPSV23) vaccine.** / 1 dose for all adults aged 28 years and older.  Meningococcal vaccine.** / Consult your health care provider.  Hepatitis A vaccine.** / Consult your health care provider.  Hepatitis B vaccine.** / Consult your health care provider.  Haemophilus influenzae type b (Hib) vaccine.** / Consult your health care provider. ** Family history and personal history of risk and conditions may change your health care provider's recommendations. Document Released: 12/30/2001 Document Revised: 03/20/2014 Document Reviewed: 03/31/2011 [facility] Patient Information 2015 [place], [place]. This information is not intended to replace advice given to you by your health care provider. Make sure you discuss any questions you have with your health care provider.

## 2015-07-16 NOTE — Assessment & Plan Note (Signed)
Will check next week as she has eaten today. Order in. Will adjust as needed. Follow up in 6 months.

## 2015-07-25 ENCOUNTER — Other Ambulatory Visit: Payer: PPO

## 2015-07-25 DIAGNOSIS — E785 Hyperlipidemia, unspecified: Secondary | ICD-10-CM

## 2015-07-25 DIAGNOSIS — I10 Essential (primary) hypertension: Secondary | ICD-10-CM

## 2015-07-25 LAB — MICROALBUMIN, URINE WAIVED
CREATININE, URINE WAIVED: 100 mg/dL (ref 10–300)
Microalb, Ur Waived: 10 mg/L (ref 0–19)

## 2015-07-25 LAB — LIPID PANEL PICCOLO, WAIVED
CHOL/HDL RATIO PICCOLO,WAIVE: 3.4 mg/dL
Cholesterol Piccolo, Waived: 162 mg/dL (ref <200)
HDL Chol Piccolo, Waived: 48 mg/dL — ABNORMAL LOW (ref >59)
LDL Chol Calc Piccolo Waived: 100 mg/dL — ABNORMAL HIGH (ref <100)
Triglycerides Piccolo,Waived: 73 mg/dL (ref <150)
VLDL CHOL CALC PICCOLO,WAIVE: 15 mg/dL (ref <30)

## 2015-07-26 LAB — COMPREHENSIVE METABOLIC PANEL
ALBUMIN: 4.1 g/dL (ref 3.6–4.8)
ALK PHOS: 74 IU/L (ref 39–117)
ALT: 26 IU/L (ref 0–32)
AST: 26 IU/L (ref 0–40)
Albumin/Globulin Ratio: 1.6 (ref 1.1–2.5)
BILIRUBIN TOTAL: 0.8 mg/dL (ref 0.0–1.2)
BUN / CREAT RATIO: 27 — AB (ref 11–26)
BUN: 24 mg/dL (ref 8–27)
CHLORIDE: 99 mmol/L (ref 97–108)
CO2: 22 mmol/L (ref 18–29)
Calcium: 9.8 mg/dL (ref 8.7–10.3)
Creatinine, Ser: 0.89 mg/dL (ref 0.57–1.00)
GFR calc Af Amer: 78 mL/min/{1.73_m2} (ref >59)
GFR calc non Af Amer: 67 mL/min/{1.73_m2} (ref >59)
GLUCOSE: 101 mg/dL — AB (ref 65–99)
Globulin, Total: 2.5 g/dL (ref 1.5–4.5)
Potassium: 4.7 mmol/L (ref 3.5–5.2)
SODIUM: 138 mmol/L (ref 134–144)
Total Protein: 6.6 g/dL (ref 6.0–8.5)

## 2015-07-30 ENCOUNTER — Encounter: Payer: Self-pay | Admitting: Family Medicine

## 2015-10-13 ENCOUNTER — Other Ambulatory Visit: Payer: Self-pay | Admitting: Family Medicine

## 2015-12-19 ENCOUNTER — Other Ambulatory Visit: Payer: Self-pay | Admitting: *Deleted

## 2015-12-19 DIAGNOSIS — Z1231 Encounter for screening mammogram for malignant neoplasm of breast: Secondary | ICD-10-CM

## 2015-12-27 ENCOUNTER — Encounter: Payer: Self-pay | Admitting: *Deleted

## 2016-01-10 ENCOUNTER — Other Ambulatory Visit: Payer: Self-pay | Admitting: Family Medicine

## 2016-01-10 NOTE — Telephone Encounter (Signed)
Dr.Johnson, patient is scheduled for 01/18/16 for a medicare wellness, do you want her to have another appointment besides that one?

## 2016-01-10 NOTE — Telephone Encounter (Signed)
Needs appointment

## 2016-01-18 ENCOUNTER — Ambulatory Visit (INDEPENDENT_AMBULATORY_CARE_PROVIDER_SITE_OTHER): Payer: Medicare HMO | Admitting: Family Medicine

## 2016-01-18 ENCOUNTER — Encounter: Payer: Self-pay | Admitting: Family Medicine

## 2016-01-18 VITALS — BP 127/71 | HR 64 | Temp 98.5°F | Ht 64.0 in | Wt 264.0 lb

## 2016-01-18 DIAGNOSIS — Z23 Encounter for immunization: Secondary | ICD-10-CM

## 2016-01-18 DIAGNOSIS — I10 Essential (primary) hypertension: Secondary | ICD-10-CM

## 2016-01-18 DIAGNOSIS — E7800 Pure hypercholesterolemia, unspecified: Secondary | ICD-10-CM

## 2016-01-18 DIAGNOSIS — G43709 Chronic migraine without aura, not intractable, without status migrainosus: Secondary | ICD-10-CM | POA: Diagnosis not present

## 2016-01-18 DIAGNOSIS — Z0000 Encounter for general adult medical examination without abnormal findings: Secondary | ICD-10-CM

## 2016-01-18 DIAGNOSIS — S41102A Unspecified open wound of left upper arm, initial encounter: Secondary | ICD-10-CM

## 2016-01-18 DIAGNOSIS — J309 Allergic rhinitis, unspecified: Secondary | ICD-10-CM | POA: Diagnosis not present

## 2016-01-18 LAB — UA/M W/RFLX CULTURE, ROUTINE
BILIRUBIN UA: NEGATIVE
GLUCOSE, UA: NEGATIVE
KETONES UA: NEGATIVE
Nitrite, UA: NEGATIVE
Protein, UA: NEGATIVE
RBC UA: NEGATIVE
SPEC GRAV UA: 1.02 (ref 1.005–1.030)
UUROB: 0.2 mg/dL (ref 0.2–1.0)
pH, UA: 6 (ref 5.0–7.5)

## 2016-01-18 LAB — MICROALBUMIN, URINE WAIVED
CREATININE, URINE WAIVED: 50 mg/dL (ref 10–300)
Microalb, Ur Waived: 10 mg/L (ref 0–19)
Microalb/Creat Ratio: <30 mg/g (ref <30)

## 2016-01-18 MED ORDER — PRAVASTATIN SODIUM 20 MG PO TABS: 20.0000 mg | ORAL_TABLET | Freq: Every day | ORAL | Status: DC

## 2016-01-18 MED ORDER — CYCLOBENZAPRINE HCL 10 MG PO TABS: 10.0000 mg | ORAL_TABLET | Freq: Every day | ORAL | Status: DC | PRN

## 2016-01-18 MED ORDER — OXYBUTYNIN CHLORIDE 5 MG PO TABS: 5.0000 mg | ORAL_TABLET | Freq: Two times a day (BID) | ORAL | Status: DC

## 2016-01-18 MED ORDER — FLUTICASONE PROPIONATE 50 MCG/ACT NA SUSP: 50.0000 | Freq: Every day | NASAL | Status: DC

## 2016-01-18 MED ORDER — LISINOPRIL-HYDROCHLOROTHIAZIDE 20-25 MG PO TABS: 1.0000 | ORAL_TABLET | Freq: Every day | ORAL | Status: DC

## 2016-01-18 NOTE — Progress Notes (Signed)
BP 127/71 mmHg  Pulse 64  Temp(Src) 98.5 F (36.9 C)  Ht 5\' 4"  (1.626 m)  Wt 264 lb (119.75 kg)  BMI 45.29 kg/m2  SpO2 97%   Subjective:    Patient ID: Felicia Vargas, female    DOB: March 04, 1946, 70 y.o.   MRN: [id number]  HPI: Felicia Vargas is a 70 y.o. female presenting on 01/18/2016 for comprehensive medical examination. Current medical complaints include:  HYPERTENSION / HYPERLIPIDEMIA Satisfied with current treatment? yes Duration of hypertension: chronic BP monitoring frequency: not checking BP medication side effects: no Duration of hyperlipidemia: chronic Cholesterol medication side effects: no Cholesterol supplements: none Medication compliance: good compliance Aspirin: yes Recent stressors: no Recurrent headaches: no Visual changes: no Palpitations: no Dyspnea: no Chest pain: no Lower extremity edema: mild with keeping the legs dependant Dizzy/lightheaded: no  MIGRAINES- doing well. No problems. Hasn't had one.   Functional Ability / Safety Screening 1.  Was the timed Get Up and Go test longer than 30 seconds?  no 2.  Does the patient need help with the phone, transportation, shopping,      preparing meals, housework, laundry, medications, or managing money?  no 3.  Does the patient's home have:  loose throw rugs in the hallway?   no      Grab bars in the bathroom? no      Handrails on the stairs?   yes      Poor lighting?   no 4.  Has the patient noticed any hearing difficulties?   no  Other Doctors:  Dr. Jamal Collin- surgery [facility]- Dr. Kemper Durie  Advanced Directives Does patient have a HCPOA?    yes If yes, name and contact information: Jahaida Dishaw Does patient have a living will or MOST form?  yes  She currently lives with: Menopausal Symptoms: no  Depression Screen done today and results listed below:  Depression screen [facility] 2/9 01/18/2016  Decreased Interest 0  Down, Depressed, Hopeless 0  PHQ - 2 Score 0    The patient does not  have a history of falls.   Past Medical History:  Past Medical History  Diagnosis Date  . Hypertension 1990's  . Diffuse cystic mastopathy 2013  . Migraines   . High cholesterol   . Allergy   . Overactive bladder   . Arthritis   . Migraine     Surgical History:  Past Surgical History  Procedure Laterality Date  . Colonoscopy  11/2004    Dr. Chauncey Reading  . Kidney surgery  1998  . Breast biopsy Right 1994  . Lithotripsy  1998    Medications:  Current Outpatient Prescriptions on File Prior to Visit  Medication Sig  . aspirin 81 MG tablet Take 81 mg by mouth daily.  . butalbital-acetaminophen-caffeine (FIORICET WITH CODEINE) 50-325-40-30 MG per capsule Take 1 capsule by mouth every 4 (four) hours as needed for headache.  . calcium carbonate (OS-CAL) 600 MG TABS Take 600 mg by mouth 2 (two) times daily with a meal.  . Cholecalciferol (VITAMIN D PO) Take 1 capsule by mouth daily.  . Multiple Vitamin (MULTIVITAMIN) tablet Take 1 tablet by mouth daily.   No current facility-administered medications on file prior to visit.    Allergies:  No Known Allergies  Social History:  Social History   Social History  . Marital Status: Widowed    Spouse Name: N/A  . Number of Children: N/A  . Years of Education: N/A   Occupational History  . Not on  file.   Social History Main Topics  . Smoking status: Never Smoker   . Smokeless tobacco: Never Used  . Alcohol Use: No  . Drug Use: No  . Sexual Activity: No   Other Topics Concern  . Not on file   Social History Narrative   History  Smoking status  . Never Smoker   Smokeless tobacco  . Never Used   History  Alcohol Use No    Family History:  Family History  Problem Relation Age of Onset  . Lung cancer Father   . Stroke Mother   . Hypertension Sister   . Arthritis Sister     Past medical history, surgical history, medications, allergies, family history and social history reviewed with patient today and changes made  to appropriate areas of the chart.   Review of Systems  Constitutional: Positive for diaphoresis. Negative for fever, chills, weight loss and malaise/fatigue.  HENT: Negative.   Eyes: Negative.   Respiratory: Negative.   Cardiovascular: Negative.   Gastrointestinal: Positive for heartburn. Negative for nausea, vomiting, abdominal pain, diarrhea, constipation, blood in stool and melena.  Genitourinary: Negative.   Musculoskeletal: Positive for joint pain. Negative for myalgias, back pain, falls and neck pain.  Skin: Negative.   Neurological: Negative.  Negative for weakness.  Endo/Heme/Allergies: Negative for environmental allergies and polydipsia. Bruises/bleeds easily.  Psychiatric/Behavioral: Negative.     All other ROS negative except what is listed above and in the HPI.      Objective:    BP 127/71 mmHg  Pulse 64  Temp(Src) 98.5 F (36.9 C)  Ht 5\' 4"  (1.626 m)  Wt 264 lb (119.75 kg)  BMI 45.29 kg/m2  SpO2 97%  Wt Readings from Last 3 Encounters:  01/18/16 264 lb (119.75 kg)  07/16/15 270 lb 9.6 oz (122.743 kg)  02/27/15 282 lb (127.914 kg)     Hearing Screening   125Hz  250Hz  500Hz  1000Hz  2000Hz  4000Hz  8000Hz   Right ear:   20 20 20 20    Left ear:   20 20 20 20      Visual Acuity Screening   Right eye Left eye Both eyes  Without correction: 20/40 20/30 20/25   With correction:       Physical Exam  Constitutional: She is oriented to person, place, and time. She appears well-developed and well-nourished. No distress.  HENT:  Head: Normocephalic and atraumatic.  Right Ear: Hearing, tympanic membrane, external ear and ear canal normal.  Left Ear: Hearing, tympanic membrane, external ear and ear canal normal.  Nose: Nose normal.  Mouth/Throat: Uvula is midline, oropharynx is clear and moist and mucous membranes are normal. No oropharyngeal exudate.  Eyes: Conjunctivae, EOM and lids are normal. Pupils are equal, round, and reactive to light. Right eye exhibits no  discharge. Left eye exhibits no discharge. No scleral icterus.  Neck: Normal range of motion. Neck supple. No JVD present. No tracheal deviation present. No thyromegaly present.  Cardiovascular: Normal rate, regular rhythm and intact distal pulses.  Exam reveals no gallop and no friction rub.   No murmur heard. Pulmonary/Chest: Effort normal and breath sounds normal. No stridor. No respiratory distress. She has no wheezes. She has no rales. She exhibits no tenderness.  Abdominal: Soft. Bowel sounds are normal. She exhibits no distension and no mass. There is no tenderness. There is no rebound and no guarding.  Genitourinary:  Deferred at patient's request  Musculoskeletal: Normal range of motion. She exhibits no edema or tenderness.  Lymphadenopathy:    She  has no cervical adenopathy.  Neurological: She is alert and oriented to person, place, and time. She has normal reflexes. She displays normal reflexes. No cranial nerve deficit. She exhibits normal muscle tone. Coordination normal.  Skin: Skin is warm, dry and intact. No rash noted. She is not diaphoretic. No erythema. No pallor.  Scratches on L arm, healing well  Psychiatric: She has a normal mood and affect. Her speech is normal and behavior is normal. Judgment and thought content normal. Cognition and memory are normal.  Nursing note and vitals reviewed.   Cognitive Testing - 6-CIT  Correct? Score   What year is it? yes 0 Yes = 0    No = 4  What month is it? yes 0 Yes = 0    No = 3  Remember:     Pia [address]au, [place], [place]     What time is it? yes 0 Yes = 0    No = 3  Count backwards from 20 to 1 yes 0 Correct = 0    1 error = 2   More than 1 error = 4  Say the months of the year in reverse. yes 0 Correct = 0    1 error = 2   More than 1 error = 4  What address did I ask you to remember? yes 0 Correct = 0  1 error = 2    2 error = 4    3 error = 6    4 error = 8    All wrong = 10       TOTAL SCORE  0/28   Interpretation:   Normal  Normal (0-7) Abnormal (8-28)    Results for orders placed or performed in visit on 07/25/15  Comprehensive metabolic panel  Result Value Ref Range   Glucose 101 (H) 65 - 99 mg/dL   BUN 24 8 - 27 mg/dL   Creatinine, Ser 0.89 0.57 - 1.00 mg/dL   GFR calc non Af Amer 67 >59 mL/min/1.73   GFR calc Af Amer 78 >59 mL/min/1.73   BUN/Creatinine Ratio 27 (H) 11 - 26   Sodium 138 134 - 144 mmol/L   Potassium 4.7 3.5 - 5.2 mmol/L   Chloride 99 97 - 108 mmol/L   CO2 22 18 - 29 mmol/L   Calcium 9.8 8.7 - 10.3 mg/dL   Total Protein 6.6 6.0 - 8.5 g/dL   Albumin 4.1 3.6 - 4.8 g/dL   Globulin, Total 2.5 1.5 - 4.5 g/dL   Albumin/Globulin Ratio 1.6 1.1 - 2.5   Bilirubin Total 0.8 0.0 - 1.2 mg/dL   Alkaline Phosphatase 74 39 - 117 IU/L   AST 26 0 - 40 IU/L   ALT 26 0 - 32 IU/L  Microalbumin, Urine Waived  Result Value Ref Range   Microalb, Ur Waived 10 0 - 19 mg/L   Creatinine, Urine Waived 100 10 - 300 mg/dL   Microalb/Creat Ratio <30 <30 mg/g  Lipid Panel Piccolo, Waived  Result Value Ref Range   Cholesterol Piccolo, Waived 162 <200 mg/dL   HDL Chol Piccolo, Waived 48 (L) >59 mg/dL   Triglycerides Piccolo,Waived 73 <150 mg/dL   Chol/HDL Ratio Piccolo,Waive 3.4 mg/dL   LDL Chol Calc Piccolo Waived 100 (H) <100 mg/dL   VLDL Chol Calc Piccolo,Waive 15 <30 mg/dL      Assessment & Plan:   Problem List Items Addressed This Visit      Cardiovascular and Mediastinum  Hypertension   Relevant Medications   lisinopril-hydrochlorothiazide (PRINZIDE,ZESTORETIC) 20-25 MG tablet   pravastatin (PRAVACHOL) 20 MG tablet   Other Relevant Orders   CBC with Differential/Platelet   Comprehensive metabolic panel   TSH   Microalbumin, Urine Waived   Migraines    Under good control. Continue current regimen. Continue to monitor. Let us know if things change.      Relevant Medications   lisinopril-hydrochlorothiazide (PRINZIDE,ZESTORETIC) 20-25 MG tablet   pravastatin (PRAVACHOL) 20 MG  tablet   cyclobenzaprine (FLEXERIL) 10 MG tablet     Respiratory   Rhinitis, allergic    Under good control. Continue current regimen. Continue to monitor. Let us know if things change.        Other   High cholesterol    Under good control. Continue current regimen. Continue to monitor. Let us know if things change.      Relevant Medications   lisinopril-hydrochlorothiazide (PRINZIDE,ZESTORETIC) 20-25 MG tablet   pravastatin (PRAVACHOL) 20 MG tablet   Other Relevant Orders   Lipid Panel w/o Chol/HDL Ratio    Other Visit Diagnoses    Medicare annual wellness visit, subsequent    -  Primary    Doing well. Up to date on screenings as discussed. Info given to patient. Recheck 1 year.     Relevant Orders    CBC with Differential/Platelet    TSH    UA/M w/rflx Culture, Routine    Open wound of arm, left, initial encounter        Healing well. Due for Td- given today.    Relevant Orders    Td : Tetanus/diphtheria >7yo Preservative  free    Immunization due        Td and prevnar given today    Relevant Orders    Pneumococcal conjugate vaccine 13-valent        Follow up plan: Return in about 6 months (around 07/20/2016) for BP and and cholesterol follow up.  Preventative Services:  Health Risk Assessment and Personalized Prevention Plan: done today Bone Mass Measurements: declined Breast Cancer Screening: ordered today CVD Screening: done today Cervical Cancer Screening: N/A Colon Cancer Screening: To be scheduled Depression Screening: done today Diabetes Screening: done today Glaucoma Screening: follow up with eye doctor Hepatitis B vaccine: N/A Hepatitis C screening: declined HIV Screening: decline Flu Vaccine: up to date Lung cancer Screening: N/A Obesity Screening: done today Pneumonia Vaccines (2): #1 given today, #2 due in 1 year STI Screening: N/A   LABORATORY TESTING:  - Pap smear: not applicable  IMMUNIZATIONS:   - Tdap: Tetanus vaccination status  reviewed: Given today. - Influenza: Up to date - Pneumovax: Due in 1 year - Prevnar: Administered today - Zostavax vaccine: Refused  SCREENING: -Mammogram: Ordered today  - Colonoscopy: Up to date  - Bone Density: Up to date   PATIENT COUNSELING:   Advised to take 1 mg of folate supplement per day if capable of pregnancy.   Sexuality: Discussed sexually transmitted diseases, partner selection, use of condoms, avoidance of unintended pregnancy  and contraceptive alternatives.   Advised to avoid cigarette smoking.  I discussed with the patient that most people either abstain from alcohol or drink within safe limits (<=14/week and <=4 drinks/occasion for males, <=7/weeks and <= 3 drinks/occasion for females) and that the risk for alcohol disorders and other health effects rises proportionally with the number of drinks per week and how often a drinker exceeds daily limits.  Discussed cessation/primary prevention of drug use and availability  of treatment for abuse.   Diet: Encouraged to adjust caloric intake to maintain  or achieve ideal body weight, to reduce intake of dietary saturated fat and total fat, to limit sodium intake by avoiding high sodium foods and not adding table salt, and to maintain adequate dietary potassium and calcium preferably from fresh fruits, vegetables, and low-fat dairy products.    stressed the importance of regular exercise  Injury prevention: Discussed safety belts, safety helmets, smoke detector, smoking near bedding or upholstery.   Dental health: Discussed importance of regular tooth brushing, flossing, and dental visits.    NEXT PREVENTATIVE PHYSICAL DUE IN 1 YEAR. Return in about 6 months (around 07/20/2016) for BP and and cholesterol follow up.

## 2016-01-18 NOTE — Patient Instructions (Addendum)
Preventative Services:  Health Risk Assessment and Personalized Prevention Plan: done today Bone Mass Measurements: declined Breast Cancer Screening: ordered today CVD Screening: done today Cervical Cancer Screening: N/A Colon Cancer Screening: To be scheduled Depression Screening: done today Diabetes Screening: done today Glaucoma Screening: follow up with eye doctor Hepatitis B vaccine: N/A Hepatitis C screening: declined HIV Screening: decline Flu Vaccine: up to date Lung cancer Screening: N/A Obesity Screening: done today Pneumonia Vaccines (2): #1 given today, #2 due in 1 year STI Screening: N/A  Menopause is a normal process in which your reproductive ability comes to an end. This process happens gradually over a span of months to years, usually between the ages of 68 and 94. Menopause is complete when you have missed 12 consecutive menstrual periods. It is important to talk with your health care provider about some of the most common conditions that affect postmenopausal women, such as heart disease, cancer, and bone loss (osteoporosis). Adopting a healthy lifestyle and getting preventive care can help to promote your health and wellness. Those actions can also lower your chances of developing some of these common conditions. WHAT SHOULD I KNOW ABOUT MENOPAUSE? During menopause, you may experience a number of symptoms, such as:  Moderate-to-severe hot flashes.  Night sweats.  Decrease in sex drive.  Mood swings.  Headaches.  Tiredness.  Irritability.  Memory problems.  Insomnia. Choosing to treat or not to treat menopausal changes is an individual decision that you make with your health care provider. WHAT SHOULD I KNOW ABOUT HORMONE REPLACEMENT THERAPY AND SUPPLEMENTS? Hormone therapy products are effective for treating symptoms that are associated with menopause, such as hot flashes and night sweats. Hormone replacement carries certain risks, especially as you  become older. If you are thinking about using estrogen or estrogen with progestin treatments, discuss the benefits and risks with your health care provider. WHAT SHOULD I KNOW ABOUT HEART DISEASE AND STROKE? Heart disease, heart attack, and stroke become more likely as you age. This may be due, in part, to the hormonal changes that your body experiences during menopause. These can affect how your body processes dietary fats, triglycerides, and cholesterol. Heart attack and stroke are both medical emergencies. There are many things that you can do to help prevent heart disease and stroke:  Have your blood pressure checked at least every 1-2 years. High blood pressure causes heart disease and increases the risk of stroke.  If you are 72-31 years old, ask your health care provider if you should take aspirin to prevent a heart attack or a stroke.  Do not use any tobacco products, including cigarettes, chewing tobacco, or electronic cigarettes. If you need help quitting, ask your health care provider.  It is important to eat a healthy diet and maintain a healthy weight.  Be sure to include plenty of vegetables, fruits, low-fat dairy products, and lean protein.  Avoid eating foods that are high in solid fats, added sugars, or salt (sodium).  Get regular exercise. This is one of the most important things that you can do for your health.  Try to exercise for at least 150 minutes each week. The type of exercise that you do should increase your heart rate and make you sweat. This is known as moderate-intensity exercise.  Try to do strengthening exercises at least twice each week. Do these in addition to the moderate-intensity exercise.  Know your numbers.Ask your health care provider to check your cholesterol and your blood glucose. Continue to have your blood  tested as directed by your health care provider. WHAT SHOULD I KNOW ABOUT CANCER SCREENING? There are several types of cancer. Take the  following steps to reduce your risk and to catch any cancer development as early as possible. Breast Cancer  Practice breast self-awareness.  This means understanding how your breasts normally appear and feel.  It also means doing regular breast self-exams. Let your health care provider know about any changes, no matter how small.  If you are 54 or older, have a clinician do a breast exam (clinical breast exam or CBE) every year. Depending on your age, family history, and medical history, it may be recommended that you also have a yearly breast X-ray (mammogram).  If you have a family history of breast cancer, talk with your health care provider about genetic screening.  If you are at high risk for breast cancer, talk with your health care provider about having an MRI and a mammogram every year.  Breast cancer (BRCA) gene test is recommended for women who have family members with BRCA-related cancers. Results of the assessment will determine the need for genetic counseling and BRCA1 and for BRCA2 testing. BRCA-related cancers include these types:  Breast. This occurs in males or females.  Ovarian.  Tubal. This may also be called fallopian tube cancer.  Cancer of the abdominal or pelvic lining (peritoneal cancer).  Prostate.  Pancreatic. Cervical, Uterine, and Ovarian Cancer Your health care provider may recommend that you be screened regularly for cancer of the pelvic organs. These include your ovaries, uterus, and vagina. This screening involves a pelvic exam, which includes checking for microscopic changes to the surface of your cervix (Pap test).  For women ages 21-65, health care providers may recommend a pelvic exam and a Pap test every three years. For women ages 66-65, they may recommend the Pap test and pelvic exam, combined with testing for human papilloma virus (HPV), every five years. Some types of HPV increase your risk of cervical cancer. Testing for HPV may also be done  on women of any age who have unclear Pap test results.  Other health care providers may not recommend any screening for nonpregnant women who are considered low risk for pelvic cancer and have no symptoms. Ask your health care provider if a screening pelvic exam is right for you.  If you have had past treatment for cervical cancer or a condition that could lead to cancer, you need Pap tests and screening for cancer for at least 20 years after your treatment. If Pap tests have been discontinued for you, your risk factors (such as having a new sexual partner) need to be reassessed to determine if you should start having screenings again. Some women have medical problems that increase the chance of getting cervical cancer. In these cases, your health care provider may recommend that you have screening and Pap tests more often.  If you have a family history of uterine cancer or ovarian cancer, talk with your health care provider about genetic screening.  If you have vaginal bleeding after reaching menopause, tell your health care provider.  There are currently no reliable tests available to screen for ovarian cancer. Lung Cancer Lung cancer screening is recommended for adults 66-67 years old who are at high risk for lung cancer because of a history of smoking. A yearly low-dose CT scan of the lungs is recommended if you:  Currently smoke.  Have a history of at least 30 pack-years of smoking and you currently smoke  or have quit within the past 15 years. A pack-year is smoking an average of one pack of cigarettes per day for one year. Yearly screening should:  Continue until it has been 15 years since you quit.  Stop if you develop a health problem that would prevent you from having lung cancer treatment. Colorectal Cancer  This type of cancer can be detected and can often be prevented.  Routine colorectal cancer screening usually begins at age 25 and continues through age 70.  If you have risk  factors for colon cancer, your health care provider may recommend that you be screened at an earlier age.  If you have a family history of colorectal cancer, talk with your health care provider about genetic screening.  Your health care provider may also recommend using home test kits to check for hidden blood in your stool.  A small camera at the end of a tube can be used to examine your colon directly (sigmoidoscopy or colonoscopy). This is done to check for the earliest forms of colorectal cancer.  Direct examination of the colon should be repeated every 5-10 years until age 28. However, if early forms of precancerous polyps or small growths are found or if you have a family history or genetic risk for colorectal cancer, you may need to be screened more often. Skin Cancer  Check your skin from head to toe regularly.  Monitor any moles. Be sure to tell your health care provider:  About any new moles or changes in moles, especially if there is a change in a mole's shape or color.  If you have a mole that is larger than the size of a pencil eraser.  If any of your family members has a history of skin cancer, especially at a young age, talk with your health care provider about genetic screening.  Always use sunscreen. Apply sunscreen liberally and repeatedly throughout the day.  Whenever you are outside, protect yourself by wearing long sleeves, pants, a wide-brimmed hat, and sunglasses. WHAT SHOULD I KNOW ABOUT OSTEOPOROSIS? Osteoporosis is a condition in which bone destruction happens more quickly than new bone creation. After menopause, you may be at an increased risk for osteoporosis. To help prevent osteoporosis or the bone fractures that can happen because of osteoporosis, the following is recommended:  If you are 1-42 years old, get at least 1,000 mg of calcium and at least 600 mg of vitamin D per day.  If you are older than age 62 but younger than age 42, get at least 1,200 mg of  calcium and at least 600 mg of vitamin D per day.  If you are older than age 24, get at least 1,200 mg of calcium and at least 800 mg of vitamin D per day. Smoking and excessive alcohol intake increase the risk of osteoporosis. Eat foods that are rich in calcium and vitamin D, and do weight-bearing exercises several times each week as directed by your health care provider. WHAT SHOULD I KNOW ABOUT HOW MENOPAUSE AFFECTS [place]? Depression may occur at any age, but it is more common as you become older. Common symptoms of depression include:  Low or sad mood.  Changes in sleep patterns.  Changes in appetite or eating patterns.  Feeling an overall lack of motivation or enjoyment of activities that you previously enjoyed.  Frequent crying spells. Talk with your health care provider if you think that you are experiencing depression. WHAT SHOULD I KNOW ABOUT IMMUNIZATIONS? It is important  that you get and maintain your immunizations. These include:  Tetanus, diphtheria, and pertussis (Tdap) booster vaccine.  Influenza every year before the flu season begins.  Pneumonia vaccine.  Shingles vaccine. Your health care provider may also recommend other immunizations.   This information is not intended to replace advice given to you by your health care provider. Make sure you discuss any questions you have with your health care provider.   Document Released: 12/26/2005 Document Revised: 11/24/2014 Document Reviewed: 07/06/2014 Elsevier Interactive Patient Education [employer].

## 2016-01-18 NOTE — Assessment & Plan Note (Signed)
Under good control. Continue current regimen. Continue to monitor. Let us know if things change.

## 2016-01-19 LAB — COMPREHENSIVE METABOLIC PANEL
A/G RATIO: 1.6 (ref 1.1–2.5)
ALT: 19 IU/L (ref 0–32)
AST: 22 IU/L (ref 0–40)
Albumin: 4 g/dL (ref 3.6–4.8)
Alkaline Phosphatase: 78 IU/L (ref 39–117)
BUN/Creatinine Ratio: 29 — ABNORMAL HIGH (ref 11–26)
BUN: 24 mg/dL (ref 8–27)
Bilirubin Total: 0.6 mg/dL (ref 0.0–1.2)
CALCIUM: 9.5 mg/dL (ref 8.7–10.3)
CO2: 22 mmol/L (ref 18–29)
CREATININE: 0.83 mg/dL (ref 0.57–1.00)
Chloride: 101 mmol/L (ref 96–106)
GFR, EST AFRICAN AMERICAN: 84 mL/min/{1.73_m2} (ref >59)
GFR, EST NON AFRICAN AMERICAN: 73 mL/min/{1.73_m2} (ref >59)
Globulin, Total: 2.5 g/dL (ref 1.5–4.5)
Glucose: 84 mg/dL (ref 65–99)
POTASSIUM: 4.3 mmol/L (ref 3.5–5.2)
Sodium: 141 mmol/L (ref 134–144)
TOTAL PROTEIN: 6.5 g/dL (ref 6.0–8.5)

## 2016-01-19 LAB — CBC WITH DIFFERENTIAL/PLATELET
BASOS: 2 %
Basophils Absolute: 0.1 10*3/uL (ref 0.0–0.2)
EOS (ABSOLUTE): 0.3 10*3/uL (ref 0.0–0.4)
EOS: 6 %
Hematocrit: 36.2 % (ref 34.0–46.6)
Hemoglobin: 12.2 g/dL (ref 11.1–15.9)
IMMATURE GRANS (ABS): 0 10*3/uL (ref 0.0–0.1)
IMMATURE GRANULOCYTES: 0 %
LYMPHS: 45 %
Lymphocytes Absolute: 2.3 10*3/uL (ref 0.7–3.1)
MCH: 31.2 pg (ref 26.6–33.0)
MCHC: 33.7 g/dL (ref 31.5–35.7)
MCV: 93 fL (ref 79–97)
Monocytes Absolute: 0.5 10*3/uL (ref 0.1–0.9)
Monocytes: 10 %
NEUTROS PCT: 37 %
Neutrophils Absolute: 1.9 10*3/uL (ref 1.4–7.0)
PLATELETS: 233 10*3/uL (ref 150–379)
RBC: 3.91 x10E6/uL (ref 3.77–5.28)
RDW: 13 % (ref 12.3–15.4)
WBC: 5.2 10*3/uL (ref 3.4–10.8)

## 2016-01-19 LAB — LIPID PANEL W/O CHOL/HDL RATIO
CHOLESTEROL TOTAL: 156 mg/dL (ref 100–199)
HDL: 46 mg/dL (ref >39)
LDL Calculated: 91 mg/dL (ref 0–99)
TRIGLYCERIDES: 95 mg/dL (ref 0–149)
VLDL Cholesterol Cal: 19 mg/dL (ref 5–40)

## 2016-01-19 LAB — TSH: TSH: 1.94 u[IU]/mL (ref 0.450–4.500)

## 2016-01-21 ENCOUNTER — Encounter: Payer: Self-pay | Admitting: Family Medicine

## 2016-01-29 ENCOUNTER — Other Ambulatory Visit: Payer: Self-pay | Admitting: Family Medicine

## 2016-01-29 NOTE — Telephone Encounter (Signed)
Patient stopped using mail order, can you resend it to [facility] [place]

## 2016-01-29 NOTE — Telephone Encounter (Signed)
Just refilled this 3/3- can we make sure that it got to the pharmacy?

## 2016-02-21 ENCOUNTER — Ambulatory Visit
Admission: RE | Admit: 2016-02-21 | Discharge: 2016-02-21 | Disposition: A | Discharge status: Home / Self Care | Payer: Medicare HMO | Source: Ambulatory Visit | Attending: General Surgery | Admitting: General Surgery

## 2016-02-21 DIAGNOSIS — Z1231 Encounter for screening mammogram for malignant neoplasm of breast: Secondary | ICD-10-CM

## 2016-02-27 ENCOUNTER — Ambulatory Visit: Payer: Self-pay | Admitting: General Surgery

## 2016-03-04 ENCOUNTER — Encounter: Payer: Self-pay | Admitting: General Surgery

## 2016-03-04 ENCOUNTER — Ambulatory Visit (INDEPENDENT_AMBULATORY_CARE_PROVIDER_SITE_OTHER): Payer: Medicare HMO | Admitting: General Surgery

## 2016-03-04 VITALS — BP 148/82 | HR 78 | Resp 12 | Ht 65.0 in | Wt 260.0 lb

## 2016-03-04 DIAGNOSIS — L570 Actinic keratosis: Secondary | ICD-10-CM | POA: Diagnosis not present

## 2016-03-04 DIAGNOSIS — L989 Disorder of the skin and subcutaneous tissue, unspecified: Secondary | ICD-10-CM | POA: Diagnosis not present

## 2016-03-04 DIAGNOSIS — N6019 Diffuse cystic mastopathy of unspecified breast: Secondary | ICD-10-CM | POA: Diagnosis not present

## 2016-03-04 NOTE — Patient Instructions (Addendum)
Patient will be asked to return to the office in one year with a bilateral screening mammogram. Colonoscopy A colonoscopy is an exam to look at the entire large intestine (colon). This exam can help find problems such as tumors, polyps, inflammation, and areas of bleeding. The exam takes about 1 hour.  LET [facility] CARE PROVIDER KNOW ABOUT:   Any allergies you have.  All medicines you are taking, including vitamins, herbs, eye drops, creams, and over-the-counter medicines.  Previous problems you or members of your family have had with the use of anesthetics.  Any blood disorders you have.  Previous surgeries you have had.  Medical conditions you have. RISKS AND COMPLICATIONS  Generally, this is a safe procedure. However, as with any procedure, complications can occur. Possible complications include:  Bleeding.  Tearing or rupture of the colon wall.  Reaction to medicines given during the exam.  Infection (rare). BEFORE THE PROCEDURE   Ask your health care provider about changing or stopping your regular medicines.  You may be prescribed an oral bowel prep. This involves drinking a large amount of medicated liquid, starting the day before your procedure. The liquid will cause you to have multiple loose stools until your stool is almost clear or light green. This cleans out your colon in preparation for the procedure.  Do not eat or drink anything else once you have started the bowel prep, unless your health care provider tells you it is safe to do so.  Arrange for someone to drive you home after the procedure. PROCEDURE   You will be given medicine to help you relax (sedative).  You will lie on your side with your knees bent.  A long, flexible tube with a light and camera on the end (colonoscope) will be inserted through the rectum and into the colon. The camera sends video back to a computer screen as it moves through the colon. The colonoscope also releases carbon dioxide  gas to inflate the colon. This helps your health care provider see the area better.  During the exam, your health care provider may take a small tissue sample (biopsy) to be examined under a microscope if any abnormalities are found.  The exam is finished when the entire colon has been viewed. AFTER THE PROCEDURE   Do not drive for 24 hours after the exam.  You may have a small amount of blood in your stool.  You may pass moderate amounts of gas and have mild abdominal cramping or bloating. This is caused by the gas used to inflate your colon during the exam.  Ask when your test results will be ready and how you will get your results. Make sure you get your test results.   This information is not intended to replace advice given to you by your health care provider. Make sure you discuss any questions you have with your health care provider.   Document Released: 10/31/2000 Document Revised: 08/24/2013 Document Reviewed: 07/11/2013 Elsevier Interactive Patient Education [employer].

## 2016-03-04 NOTE — Progress Notes (Signed)
Patient ID: Felicia Vargas, female   DOB: June 02, 1946, 70 y.o.   MRN: [id number]  Chief Complaint  Patient presents with  . Follow-up    mammogram    HPI Felicia Vargas is a 70 y.o. female.  who presents for a breast evaluation. The most recent mammogram was done on 02-21-16.  Patient does perform regular self breast checks and gets regular mammograms done.  No new breast issues. Due for colonoscopy and wants to wait til fall since she is helping care for her daughter who is having surgery.  I have reviewed the history of present illness with the patient. HPI  Past Medical History  Diagnosis Date  . Hypertension 1990's  . Diffuse cystic mastopathy 2013  . Migraines   . High cholesterol   . Allergy   . Overactive bladder   . Arthritis   . Migraine     Past Surgical History  Procedure Laterality Date  . Colonoscopy  11/2004    Dr. Chauncey [place]  . Kidney surgery  1998  . Lithotripsy  1998  . Breast biopsy Right 05/31/93    neg    Family History  Problem Relation Age of Onset  . Lung cancer Father   . Stroke Mother   . Hypertension Sister   . Arthritis Sister     Social History Social History  Substance Use Topics  . Smoking status: Never Smoker   . Smokeless tobacco: Never Used  . Alcohol Use: No    No Known Allergies  Current Outpatient Prescriptions  Medication Sig Dispense Refill  . aspirin 81 MG tablet Take 81 mg by mouth daily.    . butalbital-acetaminophen-caffeine (FIORICET WITH CODEINE) 50-325-40-30 MG per capsule Take 1 capsule by mouth every 4 (four) hours as needed for headache.    . calcium carbonate (OS-CAL) 600 MG TABS Take 600 mg by mouth 2 (two) times daily with a meal.    . Cholecalciferol (VITAMIN D PO) Take 1 capsule by mouth daily.    . cyclobenzaprine (FLEXERIL) 10 MG tablet TAKE 1 TABLET BY MOUTH TWICE A DAY AS NEEDED SPASM 90 tablet 0  . fluticasone (FLONASE) 50 MCG/ACT nasal spray Place 50 sprays into both nostrils daily. 16 g 12  .  lisinopril-hydrochlorothiazide (PRINZIDE,ZESTORETIC) 20-25 MG tablet Take 1 tablet by mouth daily. 90 tablet 1  . Multiple Vitamin (MULTIVITAMIN) tablet Take 1 tablet by mouth daily.    Marland Kitchen oxybutynin (DITROPAN) 5 MG tablet Take 1 tablet (5 mg total) by mouth 2 (two) times daily. 180 tablet 2  . pravastatin (PRAVACHOL) 20 MG tablet Take 1 tablet (20 mg total) by mouth at bedtime. 90 tablet 1   No current facility-administered medications for this visit.    Review of Systems Review of Systems  Constitutional: Negative.   Respiratory: Negative.   Cardiovascular: Negative.     Blood pressure 148/82, pulse 78, resp. rate 12, height 5\' 5"  (1.651 m), weight 260 lb (117.935 kg).  Physical Exam Physical Exam  Constitutional: She is oriented to person, place, and time. She appears well-developed and well-nourished.  HENT:  Mouth/Throat: Oropharynx is clear and moist.  Eyes: Conjunctivae are normal. No scleral icterus.  Neck: Neck supple.  Cardiovascular: Normal rate, regular rhythm and normal heart sounds.   Pulmonary/Chest: Effort normal and breath sounds normal. Right breast exhibits no inverted nipple, no mass, no nipple discharge, no skin change and no tenderness. Left breast exhibits no inverted nipple, no mass, no nipple discharge, no skin change and no  tenderness.  1 cm irregular reddish brown slightly raised skin lesion left upper chest wall with mild rim of erythremia.   Abdominal: Soft.  Lymphadenopathy:    She has no cervical adenopathy.    She has no axillary adenopathy.  Neurological: She is alert and oriented to person, place, and time.  Skin: Skin is warm and dry.  Psychiatric: Her behavior is normal.    Data Reviewed Mammogram reviewed.  Assessment    Stable exam. History of FCD. Due for colonoscopy wants to wait until fall.  New skin lesion left upper chest wall. With consent a punch biopsy was completed. Local 1% xylocane with epi. 39mL was used. Biopsy site was  cauterized and dressed with neosporin, telfa, and tegaderm  Plan    Colonoscopy with possible biopsy/polypectomy prn: Information regarding the procedure, including its potential risks and complications (including but not limited to perforation of the bowel, which may require emergency surgery to repair, and bleeding) was verbally given to the patient. Educational information regarding lower intestinal endoscopy was given to the patient. Written instructions for how to complete the bowel prep using Miralax were provided. The importance of drinking ample fluids to avoid dehydration as a result of the prep emphasized.  Patient will be asked to return to the office in one year with a bilateral screening mammogram.      PCP:  [facility] This information has been scribed by Karie Fetch RN, BSN,BC.   SANKAR,SEEPLAPUTHUR G 03/04/2016, 4:46 PM

## 2016-03-05 ENCOUNTER — Other Ambulatory Visit: Payer: Self-pay | Admitting: Family Medicine

## 2016-03-06 ENCOUNTER — Telehealth: Payer: Self-pay | Admitting: *Deleted

## 2016-03-06 NOTE — Telephone Encounter (Signed)
Notified patient as instructed, patient pleased. Discussed follow-up appointments, patient agrees  

## 2016-03-06 NOTE — Telephone Encounter (Signed)
-----   Message from Christene Lye, MD sent at 03/05/2016  3:27 PM EDT ----- Please let pt pt know the pathology was normal.

## 2016-03-23 ENCOUNTER — Ambulatory Visit
Admission: EM | Admit: 2016-03-23 | Discharge: 2016-03-23 | Disposition: A | Discharge status: Home / Self Care | Payer: Medicare HMO | Attending: Family Medicine | Admitting: Family Medicine

## 2016-03-23 ENCOUNTER — Ambulatory Visit (INDEPENDENT_AMBULATORY_CARE_PROVIDER_SITE_OTHER): Payer: Medicare HMO

## 2016-03-23 DIAGNOSIS — S6992XA Unspecified injury of left wrist, hand and finger(s), initial encounter: Secondary | ICD-10-CM | POA: Diagnosis not present

## 2016-03-23 DIAGNOSIS — W19XXXA Unspecified fall, initial encounter: Secondary | ICD-10-CM

## 2016-03-23 DIAGNOSIS — S0121XA Laceration without foreign body of nose, initial encounter: Secondary | ICD-10-CM

## 2016-03-23 DIAGNOSIS — IMO0001 Reserved for inherently not codable concepts without codable children: Secondary | ICD-10-CM

## 2016-03-23 MED ORDER — MUPIROCIN 2 % EX OINT: 1.0000 "application " | TOPICAL_OINTMENT | Freq: Three times a day (TID) | CUTANEOUS | Status: DC

## 2016-03-23 NOTE — ED Provider Notes (Signed)
CSN: [id number]     Arrival date & time 03/23/16  1147 History   First MD Initiated Contact with Patient 03/23/16 1236     Chief Complaint  Patient presents with  . Facial Laceration    Laceration to bridge of nose after fall on sidewalk. Hematoma noted to forehead. Left hand 4th finger pain.  No LOC. Pain 5/10   (Consider location/radiation/quality/duration/timing/severity/associated sxs/prior Treatment) HPI  This is a  70 year old female who today tripped when her shoe became caught in a sidewalk concrete joint she tripped and fell forward onto her face and  her for her glasses were forced into the bridge of her nose creating a laceration. He states that she remembers the fall the trip and did not have a loss of consciousness. Is fully lucid at the present time oriented to 3. She did not have a nosebleed. She does have abrasions on her nose and forehead as well as her hands and knee. He does have complaints of pain in her left ring finger over the PIP joint. She is on antiplatelets medication with aspirin 81 mg daily but has not taking any other anticoagulants. She is not having any neck discomfort.    Past Medical History  Diagnosis Date  . Hypertension 1990's  . Diffuse cystic mastopathy 2013  . Migraines   . High cholesterol   . Allergy   . Overactive bladder   . Arthritis   . Migraine    Past Surgical History  Procedure Laterality Date  . Colonoscopy  11/2004    Dr. Chauncey Reading  . Kidney surgery  1998  . Lithotripsy  1998  . Breast biopsy Right 05/31/93    neg   Family History  Problem Relation Age of Onset  . Lung cancer Father   . Stroke Mother   . Hypertension Sister   . Arthritis Sister    Social History  Substance Use Topics  . Smoking status: Never Smoker   . Smokeless tobacco: Never Used  . Alcohol Use: No   OB History    Gravida Para Term Preterm AB TAB SAB Ectopic Multiple Living   1 1        1       Obstetric Comments   First pregnancy 23 First menstrual 15      Review of Systems  Constitutional: Positive for activity change. Negative for fever, chills and fatigue.  HENT: Negative for nosebleeds.   Musculoskeletal: Negative for myalgias and neck pain.  Neurological: Negative for dizziness, tremors, seizures, syncope, facial asymmetry, speech difficulty, weakness, light-headedness, numbness and headaches.  All other systems reviewed and are negative.   Allergies  Review of patient's allergies indicates no known allergies.  Home Medications   Prior to Admission medications   Medication Sig Start Date End Date Taking? Authorizing Provider  aspirin 81 MG tablet Take 81 mg by mouth daily.   Yes Historical Provider, MD  butalbital-acetaminophen-caffeine (FIORICET WITH CODEINE) 50-325-40-30 MG per capsule Take 1 capsule by mouth every 4 (four) hours as needed for headache.   Yes Historical Provider, MD  calcium carbonate (OS-CAL) 600 MG TABS Take 600 mg by mouth 2 (two) times daily with a meal.   Yes Historical Provider, MD  Cholecalciferol (VITAMIN D PO) Take 1 capsule by mouth daily.   Yes Historical Provider, MD  cyclobenzaprine (FLEXERIL) 10 MG tablet TAKE 1 TABLET BY MOUTH TWICE A DAY AS NEEDED SPASM 03/06/16  Yes Megan P Johnson, DO  fluticasone (FLONASE) 50 MCG/ACT nasal spray Place  50 sprays into both nostrils daily. 01/18/16  Yes Megan P Johnson, DO  lisinopril-hydrochlorothiazide (PRINZIDE,ZESTORETIC) 20-25 MG tablet Take 1 tablet by mouth daily. 01/18/16  Yes Megan P Johnson, DO  Multiple Vitamin (MULTIVITAMIN) tablet Take 1 tablet by mouth daily.   Yes Historical Provider, MD  oxybutynin (DITROPAN) 5 MG tablet Take 1 tablet (5 mg total) by mouth 2 (two) times daily. 01/18/16  Yes Megan P Johnson, DO  pravastatin (PRAVACHOL) 20 MG tablet Take 1 tablet (20 mg total) by mouth at bedtime. 01/18/16  Yes Megan P Johnson, DO  mupirocin ointment (BACTROBAN) 2 % Apply 1 application topically 3 (three) times daily. 03/23/16   Lorin Picket, PA-C   Meds  Ordered and Administered this Visit  Medications - No data to display  BP 110/59 mmHg  Pulse 68  Temp(Src) 98 F (36.7 C) (Oral)  Resp 20  Ht 5\' 5"  (1.651 m)  Wt 257 lb (116.574 kg)  BMI 42.77 kg/m2  SpO2 99% No data found.   Physical Exam  Constitutional: She is oriented to person, place, and time. She appears well-developed and well-nourished. No distress.  HENT:  Head: Normocephalic and atraumatic.  Right Ear: External ear normal.  Left Ear: External ear normal.  Nose: Nose normal.  Mouth/Throat: Oropharynx is clear and moist. No oropharyngeal exudate.  Examination shows a small hematoma over the forehead just above the nose. 2 small lacerations over the bridge of the nose is grossly oriented measuring 1 cm on the top 1-3/4 of a centimeter on the bottom 1. Her nose is swollen although no deformity is palpable. Her abrasions on her nose as well. There is no nose bleeding present. Is no tenderness of the neck. Patient freely moves her neck without difficulty.  Eyes: Conjunctivae and EOM are normal. Pupils are equal, round, and reactive to light. Right eye exhibits no discharge. Left eye exhibits no discharge.  Neck: Normal range of motion. Neck supple.  Musculoskeletal: Normal range of motion. She exhibits edema and tenderness.  Has numerous small abrasions on her right hand left hand and left knee. There is swelling of the PIP of the left ring finger. It does have fairly good motion but is slightly restricted. Tendons show full pull-through and are strong  Lymphadenopathy:    She has no cervical adenopathy.  Neurological: She is oriented to person, place, and time. She has normal reflexes. She displays normal reflexes. No cranial nerve deficit. She exhibits normal muscle tone. Coordination abnormal.  Skin: Skin is warm and dry. She is not diaphoretic. There is erythema.  Psychiatric: She has a normal mood and affect. Her behavior is normal. Judgment and thought content normal.   Nursing note and vitals reviewed.   ED Course  .Marland KitchenLaceration Repair Date/Time: 03/23/2016 1:31 PM Performed by: Lorin Picket Authorized by: Coral Spikes Consent: Verbal consent obtained. Risks and benefits: risks, benefits and alternatives were discussed Consent given by: patient Patient understanding: patient states understanding of the procedure being performed Patient identity confirmed: verbally with patient, arm band, provided demographic data and hospital-assigned identification number Body area: head/neck Location details: nose Laceration length: 1.8 cm Foreign bodies: no foreign bodies Tendon involvement: none Nerve involvement: none Vascular damage: no Anesthesia: local infiltration Local anesthetic: lidocaine 1% without epinephrine Anesthetic total: 1 ml Patient sedated: no Preparation: Patient was prepped and draped in the usual sterile fashion. Irrigation solution: saline Amount of cleaning: standard Debridement: none Degree of undermining: none Skin closure: 5-0 nylon Number of sutures: 4  Technique: simple Approximation: loose Approximation difficulty: simple Dressing: antibiotic ointment Patient tolerance: Patient tolerated the procedure well with no immediate complications   (including critical care time)  Labs Review Labs Reviewed - No data to display  Imaging Review Dg Finger Ring Left  03/23/2016  CLINICAL DATA:  Patient status post fall with left ring finger injury. Initial encounter. EXAM: LEFT RING FINGER 2+V COMPARISON:  None. FINDINGS: Normal anatomic alignment. Along the volar aspect of the base of the middle phalanx of the index finger, demonstrated best on lateral view, is a small oblique lucency. Mild degenerative changes at the PIP and DIP joints. Regional soft tissues are unremarkable. IMPRESSION: Small oblique lucency along the volar base of the middle phalanx of the index finger which may represent small nondisplaced fracture. Recommend  correlation for point tenderness. Electronically Signed   By: Lovey Newcomer M.D.   On: 03/23/2016 14:42     Visual Acuity Review  Right Eye Distance:   Left Eye Distance:   Bilateral Distance:    Right Eye Near:   Left Eye Near:    Bilateral Near:         MDM   1. Fall in elderly patient, initial encounter    Discharge Medication List as of 03/23/2016  2:28 PM    START taking these medications   Details  mupirocin ointment (BACTROBAN) 2 % Apply 1 application topically 3 (three) times daily., Starting 03/23/2016, Until Discontinued, Normal      Plan: 1. Test/x-ray results and diagnosis reviewed with patient 2. rx as per orders; risks, benefits, potential side effects reviewed with patient 3. Recommend supportive treatment with Icing the face and nose 20 minutes out of every 2 hours. I recommended she sleep in a semi-reclining position for the next couple of nights. I've impressed upon her the need for realization of any change in her concentration and memory disturbances etc. that may indicate a intercranial bleed. The scan was not available today and because the patient is not on any strong anticoagulants and did not have any physical signs or symptoms of intercranial process I have decided not to her the emergency room and she will follow-up with Dr. Wynetta Emery her primary care physician tomorrow. He does live with her daughter so she will have close supervision tonight. Then on removing the sutures in 5 days. I have asked her to apply Bactroban to the wounds and abrasions 3 times a day. I have recommended she to go to emergency department if there is any change or is any concerns. 4. F/u prn if symptoms worsen or don't improve     Lorin Picket, PA-C 03/23/16 1459

## 2016-03-24 ENCOUNTER — Ambulatory Visit (INDEPENDENT_AMBULATORY_CARE_PROVIDER_SITE_OTHER): Payer: Medicare HMO | Admitting: Unknown Physician Specialty

## 2016-03-24 ENCOUNTER — Encounter: Payer: Self-pay | Admitting: Unknown Physician Specialty

## 2016-03-24 VITALS — BP 134/74 | HR 70 | Temp 98.0°F | Ht 63.2 in | Wt 256.8 lb

## 2016-03-24 DIAGNOSIS — S6000XD Contusion of unspecified finger without damage to nail, subsequent encounter: Secondary | ICD-10-CM

## 2016-03-24 DIAGNOSIS — T148XXA Other injury of unspecified body region, initial encounter: Secondary | ICD-10-CM

## 2016-03-24 DIAGNOSIS — T148 Other injury of unspecified body region: Secondary | ICD-10-CM

## 2016-03-24 NOTE — Progress Notes (Signed)
   BP 134/74 mmHg  Pulse 70  Temp(Src) 98 F (36.7 C)  Ht 5' 3.2" (1.605 m)  Wt 256 lb 12.8 oz (116.484 kg)  BMI 45.22 kg/m2  SpO2 96%   Subjective:    Patient ID: Felicia Vargas, female    DOB: November 16, 1946, 70 y.o.   MRN: [id number]  HPI: Felicia Vargas is a 70 y.o. female  Chief Complaint  Patient presents with  . Follow-up    pt states she was seen at urgent care for a fall- states she has not taken aspirin or allergy pill today but took everything else and used a little bit of flonase   Contusion Was seen on urgent care yesterday following falling on her face on the sidewalk.  2 stitches to bridge of nose and has some bruising.  Mild headache last night relieved with Tylenol.  No problems with speech, hearing, vision.  She did not black out.  No mental status changes.  She would like to be excused from jury duty tomorrow but willing to serve later.    Left ring finger wearing a splint.  X-ray was negative.    Relevant past medical, surgical, family and social history reviewed and updated as indicated. Interim medical history since our last visit reviewed. Allergies and medications reviewed and updated.  Review of Systems  Per HPI unless specifically indicated above     Objective:    BP 134/74 mmHg  Pulse 70  Temp(Src) 98 F (36.7 C)  Ht 5' 3.2" (1.605 m)  Wt 256 lb 12.8 oz (116.484 kg)  BMI 45.22 kg/m2  SpO2 96%  Wt Readings from Last 3 Encounters:  03/24/16 256 lb 12.8 oz (116.484 kg)  03/23/16 257 lb (116.574 kg)  03/04/16 260 lb (117.935 kg)    Physical Exam  Constitutional: She is oriented to person, place, and time. She appears well-developed and well-nourished. No distress.  HENT:  Head: Normocephalic and atraumatic.  Eyes: Conjunctivae and lids are normal. Right eye exhibits no discharge. Left eye exhibits no discharge. No scleral icterus.  Neck: Normal range of motion. Neck supple. No JVD present. Carotid bruit is not present.   Cardiovascular: Normal rate, regular rhythm and normal heart sounds.   Pulmonary/Chest: Effort normal and breath sounds normal.  Abdominal: Normal appearance. There is no splenomegaly or hepatomegaly.  Musculoskeletal: Normal range of motion.  Neurological: She is alert and oriented to person, place, and time. She has normal strength. No cranial nerve deficit or sensory deficit. She displays a negative Romberg sign.  Skin: Skin is warm, dry and intact. No rash noted. No pallor.  Facial bruising.  No tenderness  Psychiatric: She has a normal mood and affect. Her behavior is normal. Judgment and thought content normal.     Assessment & Plan:   Problem List Items Addressed This Visit    None    Visit Diagnoses    Contusion    -  Primary    Seems to be stable.  I don't recommend a CT scan at this time.  She will let us know of any worsening of neurological symptoms.      Contusion, finger, subsequent encounter        Recommended buddy tape as opposed to splint.          Follow up plan: Return in about 4 days (around 03/28/2016) for stitches and reassessment.

## 2016-03-28 ENCOUNTER — Encounter: Payer: Self-pay | Admitting: Unknown Physician Specialty

## 2016-03-28 ENCOUNTER — Ambulatory Visit (INDEPENDENT_AMBULATORY_CARE_PROVIDER_SITE_OTHER): Payer: Medicare HMO | Admitting: Unknown Physician Specialty

## 2016-03-28 VITALS — BP 114/70 | HR 66 | Temp 97.8°F | Ht 63.2 in | Wt 259.4 lb

## 2016-03-28 DIAGNOSIS — T07XXXA Unspecified multiple injuries, initial encounter: Secondary | ICD-10-CM

## 2016-03-28 DIAGNOSIS — Z4802 Encounter for removal of sutures: Secondary | ICD-10-CM | POA: Diagnosis not present

## 2016-03-28 DIAGNOSIS — T148 Other injury of unspecified body region: Secondary | ICD-10-CM

## 2016-03-28 NOTE — Progress Notes (Signed)
+  BP 114/70 mmHg  Pulse 66  Temp(Src) 97.8 F (36.6 C)  Ht 5' 3.2" (1.605 m)  Wt 259 lb 6.4 oz (117.663 kg)  BMI 45.68 kg/m2  SpO2 97%   Subjective:    Patient ID: Felicia Vargas, female    DOB: 1946-03-15, 70 y.o.   MRN: [id number]  HPI: Felicia Vargas is a 70 y.o. female  Chief Complaint  Patient presents with  . Suture / Staple Removal  . Wound Check   Pt is here for suture removal on the bridge of nose.  Wounds have healed well Relevant past medical, surgical, family and social history reviewed and updated as indicated. Interim medical history since our last visit reviewed. Allergies and medications reviewed and updated.  Review of Systems  Per HPI unless specifically indicated above     Objective:    BP 114/70 mmHg  Pulse 66  Temp(Src) 97.8 F (36.6 C)  Ht 5' 3.2" (1.605 m)  Wt 259 lb 6.4 oz (117.663 kg)  BMI 45.68 kg/m2  SpO2 97%  Wt Readings from Last 3 Encounters:  03/28/16 259 lb 6.4 oz (117.663 kg)  03/24/16 256 lb 12.8 oz (116.484 kg)  03/23/16 257 lb (116.574 kg)    Physical Exam  Constitutional: She is oriented to person, place, and time. She appears well-developed and well-nourished. No distress.  HENT:  Head: Normocephalic and atraumatic.  Eyes: Conjunctivae and lids are normal. Right eye exhibits no discharge. Left eye exhibits no discharge. No scleral icterus.  Cardiovascular: Normal rate.   Pulmonary/Chest: Effort normal.  Abdominal: Normal appearance. There is no splenomegaly or hepatomegaly.  Musculoskeletal: Normal range of motion.  Neurological: She is alert and oriented to person, place, and time.  Skin: Skin is intact. No rash noted. No pallor.  Contusions on face are healing.  Removed 4 sutures.  Wound care instructions given.    Psychiatric: She has a normal mood and affect. Her behavior is normal. Judgment and thought content normal.    Results for orders placed or performed in visit on 01/18/16  CBC with  Differential/Platelet  Result Value Ref Range   WBC 5.2 3.4 - 10.8 x10E3/uL   RBC 3.91 3.77 - 5.28 x10E6/uL   Hemoglobin 12.2 11.1 - 15.9 g/dL   Hematocrit 36.2 34.0 - 46.6 %   MCV 93 79 - 97 fL   MCH 31.2 26.6 - 33.0 pg   MCHC 33.7 31.5 - 35.7 g/dL   RDW 13.0 12.3 - 15.4 %   Platelets 233 150 - 379 x10E3/uL   Neutrophils 37 %   Lymphs 45 %   Monocytes 10 %   Eos 6 %   Basos 2 %   Neutrophils Absolute 1.9 1.4 - 7.0 x10E3/uL   Lymphocytes Absolute 2.3 0.7 - 3.1 x10E3/uL   Monocytes Absolute 0.5 0.1 - 0.9 x10E3/uL   EOS (ABSOLUTE) 0.3 0.0 - 0.4 x10E3/uL   Basophils Absolute 0.1 0.0 - 0.2 x10E3/uL   Immature Granulocytes 0 %   Immature Grans (Abs) 0.0 0.0 - 0.1 x10E3/uL  Comprehensive metabolic panel  Result Value Ref Range   Glucose 84 65 - 99 mg/dL   BUN 24 8 - 27 mg/dL   Creatinine, Ser 0.83 0.57 - 1.00 mg/dL   GFR calc non Af Amer 73 >59 mL/min/1.73   GFR calc Af Amer 84 >59 mL/min/1.73   BUN/Creatinine Ratio 29 (H) 11 - 26   Sodium 141 134 - 144 mmol/L   Potassium 4.3 3.5 - 5.2  mmol/L   Chloride 101 96 - 106 mmol/L   CO2 22 18 - 29 mmol/L   Calcium 9.5 8.7 - 10.3 mg/dL   Total Protein 6.5 6.0 - 8.5 g/dL   Albumin 4.0 3.6 - 4.8 g/dL   Globulin, Total 2.5 1.5 - 4.5 g/dL   Albumin/Globulin Ratio 1.6 1.1 - 2.5   Bilirubin Total 0.6 0.0 - 1.2 mg/dL   Alkaline Phosphatase 78 39 - 117 IU/L   AST 22 0 - 40 IU/L   ALT 19 0 - 32 IU/L  Lipid Panel w/o Chol/HDL Ratio  Result Value Ref Range   Cholesterol, Total 156 100 - 199 mg/dL   Triglycerides 95 0 - 149 mg/dL   HDL 46 >39 mg/dL   VLDL Cholesterol Cal 19 5 - 40 mg/dL   LDL Calculated 91 0 - 99 mg/dL  TSH  Result Value Ref Range   TSH 1.940 0.450 - 4.500 uIU/mL  UA/M w/rflx Culture, Routine  Result Value Ref Range   Specific Gravity, UA 1.020 1.005 - 1.030   pH, UA 6.0 5.0 - 7.5   Color, UA Yellow Yellow   Appearance Ur Clear Clear   Leukocytes, UA Trace (A) Negative   Protein, UA Negative Negative/Trace    Glucose, UA Negative Negative   Ketones, UA Negative Negative   RBC, UA Negative Negative   Bilirubin, UA Negative Negative   Urobilinogen, Ur 0.2 0.2 - 1.0 mg/dL   Nitrite, UA Negative Negative  Microalbumin, Urine Waived  Result Value Ref Range   Microalb, Ur Waived 10 0 - 19 mg/L   Creatinine, Urine Waived 50 10 - 300 mg/dL   Microalb/Creat Ratio <30 <30 mg/g      Assessment & Plan:   Problem List Items Addressed This Visit    None    Visit Diagnoses    Visit for suture removal    -  Primary    Multiple abrasions            Follow up plan: Return if symptoms worsen or fail to improve.

## 2016-04-11 ENCOUNTER — Telehealth: Payer: Self-pay

## 2016-04-11 NOTE — Telephone Encounter (Signed)
Noted  

## 2016-04-11 NOTE — Telephone Encounter (Signed)
Pt wants to be sure that we know that she does not use mail order for RXs.  She stated that she received RXs in the mail today but wants to be sure that we DO NOT send to them again. ALL RXs should be refilled by [facility] ONLY. Please flag the patient's chart accordingly. Thanks

## 2016-05-05 ENCOUNTER — Telehealth: Payer: Self-pay | Admitting: Family Medicine

## 2016-05-05 NOTE — Telephone Encounter (Signed)
Pt called has a question about her Lisinopril. Pt stated the RX was sent to a mail order pharmacy, they shipped the medication and she sent it back. Now her pharmacy won't fill the RX stating that it's too early. Pt would like mail order removed from her record. Please call with advice on how pt can get her medication. Thanks.

## 2016-05-05 NOTE — Telephone Encounter (Signed)
Called and spoke with patient, called and spoke with pharmacy, pharmacy is going to try to call and get an over ride.

## 2016-06-18 ENCOUNTER — Encounter: Payer: Self-pay | Admitting: Family Medicine

## 2016-06-19 DIAGNOSIS — D485 Neoplasm of uncertain behavior of skin: Secondary | ICD-10-CM | POA: Diagnosis not present

## 2016-06-19 DIAGNOSIS — L570 Actinic keratosis: Secondary | ICD-10-CM | POA: Diagnosis not present

## 2016-06-19 DIAGNOSIS — D0471 Carcinoma in situ of skin of right lower limb, including hip: Secondary | ICD-10-CM | POA: Diagnosis not present

## 2016-07-10 DIAGNOSIS — L570 Actinic keratosis: Secondary | ICD-10-CM | POA: Diagnosis not present

## 2016-07-14 DIAGNOSIS — D0471 Carcinoma in situ of skin of right lower limb, including hip: Secondary | ICD-10-CM | POA: Diagnosis not present

## 2016-07-14 DIAGNOSIS — C44722 Squamous cell carcinoma of skin of right lower limb, including hip: Secondary | ICD-10-CM | POA: Diagnosis not present

## 2016-07-20 ENCOUNTER — Other Ambulatory Visit: Payer: Self-pay | Admitting: Family Medicine

## 2016-07-21 ENCOUNTER — Other Ambulatory Visit: Payer: Self-pay | Admitting: Family Medicine

## 2016-07-25 ENCOUNTER — Ambulatory Visit (INDEPENDENT_AMBULATORY_CARE_PROVIDER_SITE_OTHER): Payer: Medicare HMO | Admitting: Family Medicine

## 2016-07-25 ENCOUNTER — Encounter: Payer: Self-pay | Admitting: Family Medicine

## 2016-07-25 VITALS — BP 112/77 | HR 73 | Temp 98.2°F | Ht 64.2 in | Wt 258.2 lb

## 2016-07-25 DIAGNOSIS — E7800 Pure hypercholesterolemia, unspecified: Secondary | ICD-10-CM | POA: Diagnosis not present

## 2016-07-25 DIAGNOSIS — G43709 Chronic migraine without aura, not intractable, without status migrainosus: Secondary | ICD-10-CM | POA: Diagnosis not present

## 2016-07-25 DIAGNOSIS — B372 Candidiasis of skin and nail: Secondary | ICD-10-CM | POA: Diagnosis not present

## 2016-07-25 DIAGNOSIS — I10 Essential (primary) hypertension: Secondary | ICD-10-CM

## 2016-07-25 LAB — LIPID PANEL PICCOLO, WAIVED
CHOL/HDL RATIO PICCOLO,WAIVE: 3.1 mg/dL
Cholesterol Piccolo, Waived: 164 mg/dL (ref <200)
HDL Chol Piccolo, Waived: 53 mg/dL — ABNORMAL LOW (ref >59)
LDL Chol Calc Piccolo Waived: 86 mg/dL (ref <100)
TRIGLYCERIDES PICCOLO,WAIVED: 128 mg/dL (ref <150)
VLDL CHOL CALC PICCOLO,WAIVE: 26 mg/dL (ref <30)

## 2016-07-25 MED ORDER — NYSTATIN 100000 UNIT/GM EX POWD: Freq: Four times a day (QID) | CUTANEOUS | 1 refills | Status: DC

## 2016-07-25 MED ORDER — LISINOPRIL-HYDROCHLOROTHIAZIDE 20-25 MG PO TABS: 1.0000 | ORAL_TABLET | Freq: Every day | ORAL | 1 refills | Status: DC

## 2016-07-25 NOTE — Assessment & Plan Note (Signed)
Under good control. No concerns. Continue current regimen. Refills given. Recheck 6 months.

## 2016-07-25 NOTE — Progress Notes (Signed)
BP 112/77 (BP Location: Right Arm, Patient Position: Sitting, Cuff Size: Large)   Pulse 73   Temp 98.2 F (36.8 C)   Ht 5' 4.2" (1.631 m)   Wt 258 lb 3.2 oz (117.1 kg)   SpO2 98%   BMI 44.04 kg/m    Subjective:    Patient ID: Felicia Vargas, female    DOB: 03-10-1946, 70 y.o.   MRN: [id number]  HPI: Felicia Vargas is a 70 y.o. female  Chief Complaint  Patient presents with  . Follow-up    For B/P and Cholesterol   HYPERTENSION / HYPERLIPIDEMIA Satisfied with current treatment? yes Duration of hypertension: chronic BP monitoring frequency: not checking BP medication side effects: no Duration of hyperlipidemia: chronic Cholesterol medication side effects: no Cholesterol supplements: none Medication compliance: excellent compliance Aspirin: yes Recent stressors: no Recurrent headaches: no Visual changes: no Palpitations: no Dyspnea: no Chest pain: no Lower extremity edema: no Dizzy/lightheaded: no  Migraines- No problems at this time. Doing well.   Relevant past medical, surgical, family and social history reviewed and updated as indicated. Interim medical history since our last visit reviewed. Allergies and medications reviewed and updated.  Review of Systems  Constitutional: Negative.   Respiratory: Negative.   Cardiovascular: Negative.   Skin:       Having treatments for skin cancer, had excision done about 2 weeks ago.  Psychiatric/Behavioral: Negative.     Per HPI unless specifically indicated above     Objective:    BP 112/77 (BP Location: Right Arm, Patient Position: Sitting, Cuff Size: Large)   Pulse 73   Temp 98.2 F (36.8 C)   Ht 5' 4.2" (1.631 m)   Wt 258 lb 3.2 oz (117.1 kg)   SpO2 98%   BMI 44.04 kg/m   Wt Readings from Last 3 Encounters:  07/25/16 258 lb 3.2 oz (117.1 kg)  03/28/16 259 lb 6.4 oz (117.7 kg)  03/24/16 256 lb 12.8 oz (116.5 kg)    Physical Exam  Constitutional: She is oriented to person, place, and time.  She appears well-developed and well-nourished. No distress.  HENT:  Head: Normocephalic and atraumatic.  Right Ear: Hearing normal.  Left Ear: Hearing normal.  Nose: Nose normal.  Eyes: Conjunctivae and lids are normal. Right eye exhibits no discharge. Left eye exhibits no discharge. No scleral icterus.  Cardiovascular: Normal rate, regular rhythm, normal heart sounds and intact distal pulses.  Exam reveals no gallop and no friction rub.   No murmur heard. Pulmonary/Chest: Effort normal and breath sounds normal. No respiratory distress. She has no wheezes. She has no rales. She exhibits no tenderness.  Musculoskeletal: Normal range of motion.  Neurological: She is alert and oriented to person, place, and time.  Skin: Skin is warm, dry and intact. Rash (bright red well demarcated rash under breasts) noted. She is not diaphoretic. No erythema. No pallor.  Psychiatric: She has a normal mood and affect. Her speech is normal and behavior is normal. Judgment and thought content normal. Cognition and memory are normal.  Vitals reviewed.   Results for orders placed or performed in visit on 06/18/16  HM PAP SMEAR  Result Value Ref Range   HM Pap smear Neg with neg HPV   HM PAP SMEAR  Result Value Ref Range   HM Pap smear Neg   HM COLONOSCOPY  Result Value Ref Range   HM Colonoscopy Patient Reported Normal See Report, Patient Reported Normal      Assessment & Plan:  Problem List Items Addressed This Visit      Cardiovascular and Mediastinum   Hypertension - Primary    Under good control. No concerns. Continue current regimen. Refills given. Recheck 6 months.       Relevant Medications   lisinopril-hydrochlorothiazide (PRINZIDE,ZESTORETIC) 20-25 MG tablet   Other Relevant Orders   Comprehensive metabolic panel   Migraines    Under good control. No concerns. Continue current regimen. Refills given. Recheck 6 months.       Relevant Medications   lisinopril-hydrochlorothiazide  (PRINZIDE,ZESTORETIC) 20-25 MG tablet   Other Relevant Orders   Comprehensive metabolic panel     Other   High cholesterol    Under good control. No concerns. Continue current regimen. Refills given. Recheck 6 months.       Relevant Medications   lisinopril-hydrochlorothiazide (PRINZIDE,ZESTORETIC) 20-25 MG tablet   Other Relevant Orders   Comprehensive metabolic panel   Lipid Panel Piccolo, Waived    Other Visit Diagnoses    Candidal intertrigo       Relevant Medications   nystatin (MYCOSTATIN/NYSTOP) powder       Follow up plan: Return in about 6 months (around 01/22/2017) for Wellness exam after 01/17/17.

## 2016-07-26 LAB — COMPREHENSIVE METABOLIC PANEL
A/G RATIO: 1.7 (ref 1.2–2.2)
ALBUMIN: 3.9 g/dL (ref 3.6–4.8)
ALK PHOS: 85 IU/L (ref 39–117)
ALT: 21 IU/L (ref 0–32)
AST: 23 IU/L (ref 0–40)
BILIRUBIN TOTAL: 0.5 mg/dL (ref 0.0–1.2)
BUN / CREAT RATIO: 39 — AB (ref 12–28)
BUN: 30 mg/dL — AB (ref 8–27)
CHLORIDE: 102 mmol/L (ref 96–106)
CO2: 22 mmol/L (ref 18–29)
Calcium: 9.8 mg/dL (ref 8.7–10.3)
Creatinine, Ser: 0.77 mg/dL (ref 0.57–1.00)
GFR calc non Af Amer: 80 mL/min/{1.73_m2} (ref >59)
GFR, EST AFRICAN AMERICAN: 92 mL/min/{1.73_m2} (ref >59)
GLUCOSE: 91 mg/dL (ref 65–99)
Globulin, Total: 2.3 g/dL (ref 1.5–4.5)
POTASSIUM: 4.3 mmol/L (ref 3.5–5.2)
Sodium: 140 mmol/L (ref 134–144)
TOTAL PROTEIN: 6.2 g/dL (ref 6.0–8.5)

## 2016-07-28 ENCOUNTER — Encounter: Payer: Self-pay | Admitting: Family Medicine

## 2016-08-05 DIAGNOSIS — L570 Actinic keratosis: Secondary | ICD-10-CM | POA: Diagnosis not present

## 2016-08-11 DIAGNOSIS — T8189XA Other complications of procedures, not elsewhere classified, initial encounter: Secondary | ICD-10-CM | POA: Diagnosis not present

## 2016-08-21 ENCOUNTER — Encounter: Payer: Self-pay | Admitting: General Surgery

## 2016-08-21 ENCOUNTER — Ambulatory Visit (INDEPENDENT_AMBULATORY_CARE_PROVIDER_SITE_OTHER): Payer: Medicare HMO | Admitting: General Surgery

## 2016-08-21 VITALS — BP 154/82 | HR 98 | Resp 16 | Ht 65.0 in | Wt 261.0 lb

## 2016-08-21 DIAGNOSIS — Z1211 Encounter for screening for malignant neoplasm of colon: Secondary | ICD-10-CM | POA: Diagnosis not present

## 2016-08-21 MED ORDER — BISACODYL 5 MG PO TBEC: 5.0000 mg | DELAYED_RELEASE_TABLET | Freq: Once | ORAL | 0 refills | Status: AC

## 2016-08-21 MED ORDER — POLYETHYLENE GLYCOL 3350 17 GM/SCOOP PO POWD: 1.0000 | Freq: Once | ORAL | 0 refills | Status: AC

## 2016-08-21 NOTE — Progress Notes (Signed)
Patient ID: Felicia Vargas, female   DOB: 1946-04-25, 70 y.o.   MRN: [id number]  Chief Complaint  Patient presents with  . Colonoscopy    HPI Felicia Vargas is a 70 y.o. female here today for a evaluation of a screening colonoscopy. Last colonoscopy was done in 2006. No GI problems at this time.  I have reviewed the history of present illness with the patient.  HPI  Past Medical History:  Diagnosis Date  . Allergy   . Arthritis   . Cancer ([place])    skin  . Diffuse cystic mastopathy 2013  . High cholesterol   . Hypertension 1990's  . Migraine   . Migraines   . Overactive bladder   . Urinary incontinence     Past Surgical History:  Procedure Laterality Date  . BREAST BIOPSY Right 05/31/93   neg  . COLONOSCOPY  11/2004   Dr. Chauncey Reading  . KIDNEY SURGERY  1998  . LITHOTRIPSY  1998    Family History  Problem Relation Age of Onset  . Lung cancer Father   . Stroke Mother   . Hypertension Sister   . Arthritis Sister     Social History Social History  Substance Use Topics  . Smoking status: Never Smoker  . Smokeless tobacco: Never Used  . Alcohol use No    No Known Allergies  Current Outpatient Prescriptions  Medication Sig Dispense Refill  . aspirin 81 MG tablet Take 81 mg by mouth daily.    . calcium carbonate (OS-CAL) 600 MG TABS Take 600 mg by mouth 2 (two) times daily with a meal.    . Cholecalciferol (VITAMIN D PO) Take 1 capsule by mouth daily.    . cyclobenzaprine (FLEXERIL) 10 MG tablet TAKE 1 TABLET BY MOUTH TWICE A DAY AS NEEDED SPASM 90 tablet 0  . fluticasone (FLONASE) 50 MCG/ACT nasal spray PLACE 1 SPRAY INTO BOTH NOSTRILS DAILY. 16 g 12  . lisinopril-hydrochlorothiazide (PRINZIDE,ZESTORETIC) 20-25 MG tablet Take 1 tablet by mouth daily. 90 tablet 1  . loratadine (CLARITIN) 10 MG tablet Take 10 mg by mouth daily.    . Multiple Vitamin (MULTIVITAMIN) tablet Take 1 tablet by mouth daily.    Marland Kitchen nystatin (MYCOSTATIN/NYSTOP) powder Apply topically 4  (four) times daily. 45 g 1  . oxybutynin (DITROPAN) 5 MG tablet TAKE 1 TABLET (5 MG) BY ORAL ROUTE 2 TIMES PER DAY 180 tablet 2  . pravastatin (PRAVACHOL) 20 MG tablet TAKE 1 TABLET BY MOUTH AT BEDTIME 90 tablet 2  . bisacodyl (DULCOLAX) 5 MG EC tablet Take 1 tablet (5 mg total) by mouth once. Take all 4 tablets at 12 noon 4 tablet 0  . polyethylene glycol powder (GLYCOLAX/MIRALAX) powder Take 255 g by mouth once. Mix whole container with 64 ounces of clear liquids 255 g 0   No current facility-administered medications for this visit.     Review of Systems Review of Systems  Constitutional: Negative.   Respiratory: Negative.   Cardiovascular: Negative.   Gastrointestinal: Negative.     Blood pressure (!) 154/82, pulse 98, resp. rate 16, height 5\' 5"  (1.651 m), weight 261 lb (118.4 kg).  Physical Exam Physical Exam  Constitutional: She is oriented to person, place, and time. She appears well-developed and well-nourished.  Eyes: Conjunctivae are normal. No scleral icterus.  Neck: Neck supple.  Cardiovascular: Normal rate, regular rhythm and normal heart sounds.   Pulmonary/Chest: Effort normal and breath sounds normal.  Abdominal: Soft. Normal appearance and bowel sounds are  normal. There is no hepatomegaly.  Lymphadenopathy:    She has no cervical adenopathy.  Neurological: She is alert and oriented to person, place, and time.  Skin: Skin is warm and dry.    Data Reviewed Last colonoscopy and path reviewed  Assessment    Stable exam. Pt due for screening colonoscopy    Plan    Colonoscopy with possible biopsy/polypectomy prn: Information regarding the procedure, including its potential risks and complications (including but not limited to perforation of the bowel, which may require emergency surgery to repair, and bleeding) was verbally given to the patient. Educational information regarding lower intestinal endoscopy was given to the patient. Written instructions for how to  complete the bowel prep using Miralax were provided. The importance of drinking ample fluids to avoid dehydration as a result of the prep emphasized.    The patient is scheduled for a Colonoscopy at [facility] on 09/23/16. They are aware to call the day before to get their arrival time. She will continue her 81 mg aspirin. She will only have her blood pressure medication by 8 am the morning of. Miralax and Dulcolax prescriptions have been sent into the patient's pharmacy. The patient is aware of date and instructions.    This information has been scribed by Gaspar Cola CMA.   SANKAR,SEEPLAPUTHUR G 08/21/2016, 3:33 PM

## 2016-08-21 NOTE — Patient Instructions (Addendum)
Colonoscopy A colonoscopy is an exam to look at the entire large intestine (colon). This exam can help find problems such as tumors, polyps, inflammation, and areas of bleeding. The exam takes about 1 hour.  LET [facility] CARE PROVIDER KNOW ABOUT:   Any allergies you have.  All medicines you are taking, including vitamins, herbs, eye drops, creams, and over-the-counter medicines.  Previous problems you or members of your family have had with the use of anesthetics.  Any blood disorders you have.  Previous surgeries you have had.  Medical conditions you have. RISKS AND COMPLICATIONS  Generally, this is a safe procedure. However, as with any procedure, complications can occur. Possible complications include:  Bleeding.  Tearing or rupture of the colon wall.  Reaction to medicines given during the exam.  Infection (rare). BEFORE THE PROCEDURE   Ask your health care provider about changing or stopping your regular medicines.  You may be prescribed an oral bowel prep. This involves drinking a large amount of medicated liquid, starting the day before your procedure. The liquid will cause you to have multiple loose stools until your stool is almost clear or light green. This cleans out your colon in preparation for the procedure.  Do not eat or drink anything else once you have started the bowel prep, unless your health care provider tells you it is safe to do so.  Arrange for someone to drive you home after the procedure. PROCEDURE   You will be given medicine to help you relax (sedative).  You will lie on your side with your knees bent.  A long, flexible tube with a light and camera on the end (colonoscope) will be inserted through the rectum and into the colon. The camera sends video back to a computer screen as it moves through the colon. The colonoscope also releases carbon dioxide gas to inflate the colon. This helps your health care provider see the area better.  During  the exam, your health care provider may take a small tissue sample (biopsy) to be examined under a microscope if any abnormalities are found.  The exam is finished when the entire colon has been viewed. AFTER THE PROCEDURE   Do not drive for 24 hours after the exam.  You may have a small amount of blood in your stool.  You may pass moderate amounts of gas and have mild abdominal cramping or bloating. This is caused by the gas used to inflate your colon during the exam.  Ask when your test results will be ready and how you will get your results. Make sure you get your test results.   This information is not intended to replace advice given to you by your health care provider. Make sure you discuss any questions you have with your health care provider.   Document Released: 10/31/2000 Document Revised: 08/24/2013 Document Reviewed: 07/11/2013 Elsevier Interactive Patient Education [employer].  The patient is scheduled for a Colonoscopy at [facility] on 09/23/16. They are aware to call the day before to get their arrival time. She will continue her 81 mg aspirin. She will only have her blood pressure medication by 8 am the morning of. Miralax and Dulcolax prescriptions have been sent into the patient's pharmacy. The patient is aware of date and instructions.

## 2016-08-25 ENCOUNTER — Ambulatory Visit (INDEPENDENT_AMBULATORY_CARE_PROVIDER_SITE_OTHER): Payer: Medicare HMO

## 2016-08-25 DIAGNOSIS — Z23 Encounter for immunization: Secondary | ICD-10-CM | POA: Diagnosis not present

## 2016-09-10 ENCOUNTER — Other Ambulatory Visit: Payer: Self-pay | Admitting: General Surgery

## 2016-09-16 DIAGNOSIS — Z48817 Encounter for surgical aftercare following surgery on the skin and subcutaneous tissue: Secondary | ICD-10-CM | POA: Diagnosis not present

## 2016-09-22 ENCOUNTER — Encounter: Payer: Self-pay | Admitting: *Deleted

## 2016-09-22 NOTE — Progress Notes (Signed)
Patient has rescheduled her colonoscopy from 09-23-16 to 10-15-16 at [facility]. This is due to a head cold.   Trish in Endoscopy aware of date change.

## 2016-09-24 ENCOUNTER — Ambulatory Visit (INDEPENDENT_AMBULATORY_CARE_PROVIDER_SITE_OTHER): Payer: Medicare HMO | Admitting: Family Medicine

## 2016-09-24 ENCOUNTER — Encounter: Payer: Self-pay | Admitting: Family Medicine

## 2016-09-24 VITALS — BP 106/67 | HR 75 | Temp 98.3°F | Wt 258.0 lb

## 2016-09-24 DIAGNOSIS — B9789 Other viral agents as the cause of diseases classified elsewhere: Secondary | ICD-10-CM | POA: Diagnosis not present

## 2016-09-24 DIAGNOSIS — J069 Acute upper respiratory infection, unspecified: Secondary | ICD-10-CM

## 2016-09-24 MED ORDER — HYDROCOD POLST-CPM POLST ER 10-8 MG/5ML PO SUER
5.0000 mL | Freq: Two times a day (BID) | ORAL | 0 refills | Status: DC | PRN
Start: 2016-09-24 — End: 2017-02-02

## 2016-09-24 MED ORDER — BENZONATATE 100 MG PO CAPS: 100.0000 mg | ORAL_CAPSULE | Freq: Two times a day (BID) | ORAL | 0 refills | Status: DC | PRN

## 2016-09-24 MED ORDER — AMOXICILLIN-POT CLAVULANATE 875-125 MG PO TABS: 1.0000 | ORAL_TABLET | Freq: Two times a day (BID) | ORAL | 0 refills | Status: DC

## 2016-09-24 NOTE — Progress Notes (Signed)
   BP 106/67   Pulse 75   Temp 98.3 F (36.8 C)   Wt 258 lb (117 kg)   SpO2 96%   BMI 42.93 kg/m    Subjective:    Patient ID: Felicia Vargas, female    DOB: Sep 20, 1946, 69 y.o.   MRN: [id number]  HPI: Felicia Vargas is a 70 y.o. female  Chief Complaint  Patient presents with  . URI    Sick since Sat. Head and chest congestion, green productive cough, runny nose, nasal congestion. No sore throat, no fever, no ear ache.    5 day history of congestion, productive cough, rhinorrhea. No SOB or wheezing. Taking OTC cold and flu medicines. Denies fever, sore throat, ear ache. Works at [employer], lots of sick contacts.   Relevant past medical, surgical, family and social history reviewed and updated as indicated. Interim medical history since our last visit reviewed. Allergies and medications reviewed and updated.  Review of Systems  Constitutional: Positive for fatigue.  HENT: Positive for congestion and rhinorrhea.   Eyes: Negative.   Respiratory: Positive for cough.   Cardiovascular: Negative.   Gastrointestinal: Negative.   Genitourinary: Negative.   Musculoskeletal: Negative.   Neurological: Negative.   Psychiatric/Behavioral: Negative.     Per HPI unless specifically indicated above     Objective:    BP 106/67   Pulse 75   Temp 98.3 F (36.8 C)   Wt 258 lb (117 kg)   SpO2 96%   BMI 42.93 kg/m   Wt Readings from Last 3 Encounters:  09/24/16 258 lb (117 kg)  08/21/16 261 lb (118.4 kg)  07/25/16 258 lb 3.2 oz (117.1 kg)    Physical Exam  Constitutional: She is oriented to person, place, and time. She appears well-developed and well-nourished. No distress.  HENT:  Head: Atraumatic.  Right Ear: External ear normal.  Left Ear: External ear normal.  Nose: Nose normal.  Oropharynx erythematous  Eyes: Conjunctivae are normal. Pupils are equal, round, and reactive to light. No scleral icterus.  Neck: Normal range of motion. Neck supple.  Cardiovascular: Normal  rate and normal heart sounds.   Pulmonary/Chest: Effort normal. No respiratory distress.  Musculoskeletal: Normal range of motion.  Lymphadenopathy:    She has no cervical adenopathy.  Neurological: She is alert and oriented to person, place, and time.  Skin: Skin is warm and dry.  Psychiatric: She has a normal mood and affect. Her behavior is normal.  Nursing note and vitals reviewed.     Assessment & Plan:   Problem List Items Addressed This Visit    None    Visit Diagnoses    Viral upper respiratory tract infection    -  Primary   Tessalon perles and tussionex sent. Supportive care discussed. If no better by weekend, can pick up augmentin. Follow up if no improvement       Follow up plan: Return if symptoms worsen or fail to improve.

## 2016-09-24 NOTE — Patient Instructions (Addendum)
Augmentin with breakfast and dinner for 1 week if no better by weekend Tessalon perles for cough, can take it 3 times daily - non drowsy Tussionex cough syrup for night time - DROWSY

## 2016-10-15 ENCOUNTER — Ambulatory Visit
Admission: RE | Admit: 2016-10-15 | Discharge: 2016-10-15 | Disposition: A | Discharge status: Home / Self Care | Payer: Medicare HMO | Source: Ambulatory Visit | Attending: General Surgery | Admitting: General Surgery

## 2016-10-15 ENCOUNTER — Encounter
Admission: RE | Disposition: A | Discharge status: Home / Self Care | Payer: Self-pay | Source: Ambulatory Visit | Attending: General Surgery

## 2016-10-15 ENCOUNTER — Ambulatory Visit: Payer: Medicare HMO | Admitting: Anesthesiology

## 2016-10-15 DIAGNOSIS — Z1211 Encounter for screening for malignant neoplasm of colon: Secondary | ICD-10-CM | POA: Insufficient documentation

## 2016-10-15 DIAGNOSIS — N3281 Overactive bladder: Secondary | ICD-10-CM | POA: Insufficient documentation

## 2016-10-15 DIAGNOSIS — I10 Essential (primary) hypertension: Secondary | ICD-10-CM | POA: Diagnosis not present

## 2016-10-15 DIAGNOSIS — Z7982 Long term (current) use of aspirin: Secondary | ICD-10-CM | POA: Diagnosis not present

## 2016-10-15 DIAGNOSIS — K219 Gastro-esophageal reflux disease without esophagitis: Secondary | ICD-10-CM | POA: Insufficient documentation

## 2016-10-15 DIAGNOSIS — K5790 Diverticulosis of intestine, part unspecified, without perforation or abscess without bleeding: Secondary | ICD-10-CM | POA: Diagnosis not present

## 2016-10-15 DIAGNOSIS — Z85828 Personal history of other malignant neoplasm of skin: Secondary | ICD-10-CM | POA: Diagnosis not present

## 2016-10-15 DIAGNOSIS — K648 Other hemorrhoids: Secondary | ICD-10-CM | POA: Insufficient documentation

## 2016-10-15 DIAGNOSIS — Z79899 Other long term (current) drug therapy: Secondary | ICD-10-CM | POA: Diagnosis not present

## 2016-10-15 DIAGNOSIS — E7800 Pure hypercholesterolemia, unspecified: Secondary | ICD-10-CM | POA: Insufficient documentation

## 2016-10-15 DIAGNOSIS — M1990 Unspecified osteoarthritis, unspecified site: Secondary | ICD-10-CM | POA: Diagnosis not present

## 2016-10-15 DIAGNOSIS — K5730 Diverticulosis of large intestine without perforation or abscess without bleeding: Secondary | ICD-10-CM | POA: Insufficient documentation

## 2016-10-15 HISTORY — PX: COLONOSCOPY WITH PROPOFOL: SHX5780

## 2016-10-15 SURGERY — COLONOSCOPY WITH PROPOFOL
Anesthesia: General

## 2016-10-15 MED ORDER — PROPOFOL 10 MG/ML IV BOLUS
INTRAVENOUS | Status: DC | PRN
  Administered 2016-10-15: 70 mg via INTRAVENOUS

## 2016-10-15 MED ORDER — PROPOFOL 500 MG/50ML IV EMUL
INTRAVENOUS | Status: DC | PRN
  Administered 2016-10-15: 120 ug/kg/min via INTRAVENOUS

## 2016-10-15 MED ORDER — SODIUM CHLORIDE 0.9 % IV SOLN
INTRAVENOUS | Status: DC
  Administered 2016-10-15: 09:00:00 via INTRAVENOUS

## 2016-10-15 MED ORDER — LIDOCAINE 2% (20 MG/ML) 5 ML SYRINGE
INTRAMUSCULAR | Status: DC | PRN
  Administered 2016-10-15: 50 mg via INTRAVENOUS

## 2016-10-15 NOTE — Op Note (Signed)
[facility] Gastroenterology Patient Name: Felicia Vargas Procedure Date: 10/15/2016 9:46 AM MRN: [id number] Account #: [id number] Date of Birth: 02-08-1946 Admit Type: Outpatient Age: 70 Room: [facility] ENDO ROOM 1 Gender: Female Note Status: Finalized Procedure:            Colonoscopy Indications:          Screening for colorectal malignant neoplasm Providers:            Loida Calamia G. Jamal Collin, MD Referring MD:         Valerie Roys (Referring MD) Medicines:            General Anesthesia Complications:        No immediate complications. Procedure:            Pre-Anesthesia Assessment:                       - General anesthesia under the supervision of an                        anesthesiologist was determined to be medically                        necessary for this procedure based on review of the                        patient's medical history, medications, and prior                        anesthesia history.                       After obtaining informed consent, the colonoscope was                        passed under direct vision. Throughout the procedure,                        the patient's blood pressure, pulse, and oxygen                        saturations were monitored continuously. The                        Colonoscope was introduced through the anus and                        advanced to the the cecum, identified by the ileocecal                        valve. The colonoscopy was performed without                        difficulty. The patient tolerated the procedure well.                        The quality of the bowel preparation was good. Findings:      The perianal and digital rectal examinations were normal.      Multiple small and large-mouthed diverticula were found in the sigmoid       colon.      Internal hemorrhoids were found during retroflexion. The hemorrhoids  were small.      The exam was otherwise without abnormality on direct  and retroflexion       views. Impression:           - Diverticulosis in the sigmoid colon.                       - Internal hemorrhoids.                       - The examination was otherwise normal on direct and                        retroflexion views.                       - No specimens collected. Recommendation:       - Discharge patient to home.                       - Resume previous diet.                       - Continue present medications.                       - Repeat colonoscopy in 10 years for screening purposes. Procedure Code(s):    --- Professional ---                       [phone], Colonoscopy, flexible; diagnostic, including                        collection of specimen(s) by brushing or washing, when                        performed (separate procedure) Diagnosis Code(s):    --- Professional ---                       Z12.11, Encounter for screening for malignant neoplasm                        of colon                       K64.8, Other hemorrhoids                       K57.30, Diverticulosis of large intestine without                        perforation or abscess without bleeding CPT copyright 2016 American Medical Association. All rights reserved. The codes documented in this report are preliminary and upon coder review may  be revised to meet current compliance requirements. Christene Lye, MD 10/15/2016 10:24:51 AM This report has been signed electronically. Number of Addenda: 0 Note Initiated On: 10/15/2016 9:46 AM Scope Withdrawal Time: 0 hours 7 minutes 18 seconds  Total Procedure Duration: 0 hours 29 minutes 35 seconds       [facility]

## 2016-10-15 NOTE — H&P (Signed)
Felicia Vargas is an 70 y.o. female.   Chief Complaint: here for colonoscopy. HPI: 71 yr old female  For screening colonoscopy. Last one was in 2006. Nio GI complaints  Past Medical History:  Diagnosis Date  . Allergy   . Arthritis   . Cancer ([place])    skin  . Diffuse cystic mastopathy 2013  . High cholesterol   . Hypertension 1990's  . Migraine   . Migraines   . Overactive bladder   . Urinary incontinence     Past Surgical History:  Procedure Laterality Date  . BREAST BIOPSY Right 05/31/93   neg/lump  . COLONOSCOPY  11/2004   Dr. Chauncey Reading  . KIDNEY SURGERY  1998  . LITHOTRIPSY  1998    Family History  Problem Relation Age of Onset  . Lung cancer Father   . Stroke Mother   . Hypertension Sister   . Arthritis Sister    Social History:  reports that she has never smoked. She has never used smokeless tobacco. She reports that she does not drink alcohol or use drugs.  Allergies: No Known Allergies  Medications Prior to Admission  Medication Sig Dispense Refill  . aspirin 81 MG tablet Take 81 mg by mouth daily.    . calcium carbonate (OS-CAL) 600 MG TABS Take 600 mg by mouth 2 (two) times daily with a meal.    . Cholecalciferol (VITAMIN D PO) Take 1 capsule by mouth daily.    . cyclobenzaprine (FLEXERIL) 10 MG tablet TAKE 1 TABLET BY MOUTH TWICE A DAY AS NEEDED SPASM 90 tablet 0  . fluticasone (FLONASE) 50 MCG/ACT nasal spray PLACE 1 SPRAY INTO BOTH NOSTRILS DAILY. 16 g 12  . lisinopril-hydrochlorothiazide (PRINZIDE,ZESTORETIC) 20-25 MG tablet Take 1 tablet by mouth daily. 90 tablet 1  . loratadine (CLARITIN) 10 MG tablet Take 10 mg by mouth daily.    . Multiple Vitamin (MULTIVITAMIN) tablet Take 1 tablet by mouth daily.    Marland Kitchen oxybutynin (DITROPAN) 5 MG tablet TAKE 1 TABLET (5 MG) BY ORAL ROUTE 2 TIMES PER DAY 180 tablet 2  . pravastatin (PRAVACHOL) 20 MG tablet TAKE 1 TABLET BY MOUTH AT BEDTIME 90 tablet 2  . amoxicillin-clavulanate (AUGMENTIN) 875-125 MG tablet Take 1  tablet by mouth 2 (two) times daily. (Patient not taking: Reported on 10/15/2016) 14 tablet 0  . benzonatate (TESSALON) 100 MG capsule Take 1 capsule (100 mg total) by mouth 2 (two) times daily as needed for cough. (Patient not taking: Reported on 10/15/2016) 20 capsule 0  . chlorpheniramine-HYDROcodone (TUSSIONEX PENNKINETIC ER) 10-8 MG/5ML SUER Take 5 mLs by mouth every 12 (twelve) hours as needed for cough. 115 mL 0  . nystatin (MYCOSTATIN/NYSTOP) powder Apply topically 4 (four) times daily. (Patient not taking: Reported on 10/15/2016) 45 g 1    No results found for this or any previous visit (from the past 48 hour(s)). No results found.  Review of Systems  Constitutional: Negative.   Respiratory: Negative.   Cardiovascular: Negative.   Gastrointestinal: Negative.   Genitourinary: Negative.     Blood pressure (!) 104/49, pulse 66, temperature 97.6 F (36.4 C), temperature source Tympanic, resp. rate 16, height 5\' 5"  (1.651 m), weight 256 lb (116.1 kg), SpO2 100 %. Physical Exam  Constitutional: She is oriented to person, place, and time. She appears well-developed and well-nourished.  Eyes: Conjunctivae are normal. No scleral icterus.  Neck: Neck supple.  Cardiovascular: Normal rate, regular rhythm and normal heart sounds.   Respiratory: Effort normal and  breath sounds normal.  GI: Soft. Bowel sounds are normal. She exhibits no distension and no mass. There is no tenderness.  Lymphadenopathy:    She has cervical adenopathy.  Neurological: She is alert and oriented to person, place, and time.  Skin: Skin is warm and dry.     Assessment/Plan Stable exam. OK to proceed with colonoscopy  Christene Lye, MD 10/15/2016, 10:30 AM

## 2016-10-15 NOTE — Transfer of Care (Signed)
Immediate Anesthesia Transfer of Care Note  Patient: Felicia Vargas  Procedure(s) Performed: Procedure(s): COLONOSCOPY WITH PROPOFOL (N/A)  Patient Location: Endoscopy Unit  Anesthesia Type:General  Level of Consciousness: sedated  Airway & Oxygen Therapy: Patient connected to nasal cannula oxygen  Post-op Assessment: Report given to RN  Post vital signs: stable  Last Vitals:  Vitals:   10/15/16 0847 10/15/16 1025  BP: 135/61 (!) 104/49  Pulse: 76 66  Resp: 18 16  Temp: 36.6 C 36.4 C    Last Pain:  Vitals:   10/15/16 1025  TempSrc: Tympanic  PainSc: Asleep         Complications: No apparent anesthesia complications

## 2016-10-15 NOTE — Anesthesia Preprocedure Evaluation (Signed)
Anesthesia Evaluation  Patient identified by MRN, date of birth, ID band Patient awake    Reviewed: Allergy & Precautions, H&P , NPO status , Patient's Chart, lab work & pertinent test results, reviewed documented beta blocker date and time   History of Anesthesia Complications Negative for: history of anesthetic complications  Airway Mallampati: II  TM Distance: >3 FB Neck ROM: full    Dental  (+) Partial Lower, Edentulous Upper, Upper Dentures   Pulmonary neg shortness of breath, neg sleep apnea, neg COPD, Recent URI , Resolved,           Cardiovascular Exercise Tolerance: Good hypertension, On Medications (-) angina(-) CAD, (-) Past MI, (-) Cardiac Stents and (-) CABG (-) dysrhythmias (-) Valvular Problems/Murmurs     Neuro/Psych  Headaches, neg Seizures negative psych ROS   GI/Hepatic Neg liver ROS, GERD  ,  Endo/Other  neg diabetesMorbid obesity  Renal/GU negative Renal ROS  negative genitourinary   Musculoskeletal   Abdominal   Peds  Hematology negative hematology ROS (+)   Anesthesia Other Findings Past Medical History: No date: Allergy No date: Arthritis No date: Cancer [facility])     Comment: skin 2013: Diffuse cystic mastopathy No date: High cholesterol 1990's: Hypertension No date: Migraine No date: Migraines No date: Overactive bladder No date: Urinary incontinence   Reproductive/Obstetrics negative OB ROS                             Anesthesia Physical Anesthesia Plan  ASA: III  Anesthesia Plan: General   Post-op Pain Management:    Induction:   Airway Management Planned:   Additional Equipment:   Intra-op Plan:   Post-operative Plan:   Informed Consent: I have reviewed the patients History and Physical, chart, labs and discussed the procedure including the risks, benefits and alternatives for the proposed anesthesia with the patient or authorized  representative who has indicated his/her understanding and acceptance.   Dental Advisory Given  Plan Discussed with: Anesthesiologist, CRNA and Surgeon  Anesthesia Plan Comments:         Anesthesia Quick Evaluation

## 2016-10-15 NOTE — Anesthesia Procedure Notes (Signed)
Date/Time: 10/15/2016 10:06 AM Performed by: Aline Brochure Pre-anesthesia Checklist: Patient identified, Emergency Drugs available, Suction available, Patient being monitored and Timeout performed Oxygen Delivery Method: Nasal cannula

## 2016-10-15 NOTE — Interval H&P Note (Signed)
History and Physical Interval Note:  10/15/2016 10:33 AM  Felicia Vargas  has presented today for surgery, with the diagnosis of SCREENING  The various methods of treatment have been discussed with the patient and family. After consideration of risks, benefits and other options for treatment, the patient has consented to  Procedure(s): COLONOSCOPY WITH PROPOFOL (N/A) as a surgical intervention .  The patient's history has been reviewed, patient examined, no change in status, stable for surgery.  I have reviewed the patient's chart and labs.  Questions were answered to the patient's satisfaction.     Rebekka Lobello G

## 2016-10-16 ENCOUNTER — Encounter: Payer: Self-pay | Admitting: General Surgery

## 2016-10-17 NOTE — Anesthesia Postprocedure Evaluation (Signed)
Anesthesia Post Note  Patient: Felicia Vargas CISLO  Procedure(s) Performed: Procedure(s) (LRB): COLONOSCOPY WITH PROPOFOL (N/A)  Patient location during evaluation: Endoscopy Anesthesia Type: General Level of consciousness: awake and alert Pain management: pain level controlled Vital Signs Assessment: post-procedure vital signs reviewed and stable Respiratory status: spontaneous breathing, nonlabored ventilation, respiratory function stable and patient connected to nasal cannula oxygen Cardiovascular status: blood pressure returned to baseline and stable Postop Assessment: no signs of nausea or vomiting Anesthetic complications: no    Last Vitals:  Vitals:   10/15/16 1045 10/15/16 1055  BP: 101/69 (!) 101/53  Pulse: 71 69  Resp: 16 (!) 21  Temp:      Last Pain:  Vitals:   10/16/16 1059  TempSrc:   PainSc: 0-No pain                 Martha Clan

## 2017-01-02 ENCOUNTER — Other Ambulatory Visit: Payer: Self-pay

## 2017-01-02 DIAGNOSIS — Z1231 Encounter for screening mammogram for malignant neoplasm of breast: Secondary | ICD-10-CM

## 2017-01-14 DIAGNOSIS — Z872 Personal history of diseases of the skin and subcutaneous tissue: Secondary | ICD-10-CM | POA: Diagnosis not present

## 2017-01-14 DIAGNOSIS — Z859 Personal history of malignant neoplasm, unspecified: Secondary | ICD-10-CM | POA: Diagnosis not present

## 2017-01-14 DIAGNOSIS — L570 Actinic keratosis: Secondary | ICD-10-CM | POA: Diagnosis not present

## 2017-01-16 ENCOUNTER — Other Ambulatory Visit: Payer: Self-pay | Admitting: Family Medicine

## 2017-01-30 ENCOUNTER — Ambulatory Visit: Payer: Self-pay | Admitting: Family Medicine

## 2017-02-02 ENCOUNTER — Encounter: Payer: Self-pay | Admitting: Family Medicine

## 2017-02-02 ENCOUNTER — Ambulatory Visit (INDEPENDENT_AMBULATORY_CARE_PROVIDER_SITE_OTHER): Payer: Medicare HMO | Admitting: Family Medicine

## 2017-02-02 VITALS — BP 123/76 | HR 69 | Temp 98.3°F | Resp 17 | Ht 64.0 in | Wt 259.0 lb

## 2017-02-02 DIAGNOSIS — Z1329 Encounter for screening for other suspected endocrine disorder: Secondary | ICD-10-CM

## 2017-02-02 DIAGNOSIS — Z0000 Encounter for general adult medical examination without abnormal findings: Secondary | ICD-10-CM

## 2017-02-02 DIAGNOSIS — N3281 Overactive bladder: Secondary | ICD-10-CM | POA: Diagnosis not present

## 2017-02-02 DIAGNOSIS — Z23 Encounter for immunization: Secondary | ICD-10-CM

## 2017-02-02 DIAGNOSIS — E7800 Pure hypercholesterolemia, unspecified: Secondary | ICD-10-CM

## 2017-02-02 DIAGNOSIS — Z1159 Encounter for screening for other viral diseases: Secondary | ICD-10-CM | POA: Diagnosis not present

## 2017-02-02 DIAGNOSIS — Z13820 Encounter for screening for osteoporosis: Secondary | ICD-10-CM

## 2017-02-02 DIAGNOSIS — I10 Essential (primary) hypertension: Secondary | ICD-10-CM

## 2017-02-02 LAB — UA/M W/RFLX CULTURE, ROUTINE
BILIRUBIN UA: NEGATIVE
Glucose, UA: NEGATIVE
Ketones, UA: NEGATIVE
LEUKOCYTES UA: NEGATIVE
NITRITE UA: NEGATIVE
PH UA: 7 (ref 5.0–7.5)
Protein, UA: NEGATIVE
RBC UA: NEGATIVE
Specific Gravity, UA: 1.015 (ref 1.005–1.030)
Urobilinogen, Ur: 0.2 mg/dL (ref 0.2–1.0)

## 2017-02-02 LAB — MICROALBUMIN, URINE WAIVED
CREATININE, URINE WAIVED: 50 mg/dL (ref 10–300)
Microalb, Ur Waived: 10 mg/L (ref 0–19)

## 2017-02-02 LAB — MICROSCOPIC EXAMINATION
BACTERIA UA: NONE SEEN
RBC, UA: NONE SEEN /hpf (ref 0–?)
WBC UA: NONE SEEN /HPF (ref 0–?)

## 2017-02-02 MED ORDER — FLUTICASONE PROPIONATE 50 MCG/ACT NA SUSP: NASAL | 12 refills | Status: DC

## 2017-02-02 MED ORDER — OXYBUTYNIN CHLORIDE 5 MG PO TABS: ORAL_TABLET | ORAL | 3 refills | Status: DC

## 2017-02-02 MED ORDER — LISINOPRIL-HYDROCHLOROTHIAZIDE 20-25 MG PO TABS: 1.0000 | ORAL_TABLET | Freq: Every day | ORAL | 3 refills | Status: DC

## 2017-02-02 MED ORDER — PRAVASTATIN SODIUM 20 MG PO TABS: 20.0000 mg | ORAL_TABLET | Freq: Every day | ORAL | 3 refills | Status: DC

## 2017-02-02 NOTE — Progress Notes (Signed)
BP 123/76 (BP Location: Left Arm, Patient Position: Sitting, Cuff Size: Large)   Pulse 69   Temp 98.3 F (36.8 C) (Oral)   Resp 17   Ht 5\' 4"  (1.626 m)   Wt 259 lb (117.5 kg)   SpO2 96%   BMI 44.46 kg/m    Subjective:    Patient ID: Felicia Vargas, female    DOB: 05-25-1946, 71 y.o.   MRN: [id number]  HPI: Felicia Vargas is a 71 y.o. female presenting on 02/02/2017 for comprehensive medical examination. Current medical complaints include:  HYPERTENSION / HYPERLIPIDEMIA Satisfied with current treatment? yes Duration of hypertension: chronic BP monitoring frequency: not checking BP medication side effects: no Past BP meds: lisinopril-hctz Duration of hyperlipidemia: chronic Cholesterol medication side effects: no Cholesterol supplements: none Past cholesterol medications: pravastatin Medication compliance: excellent compliance Aspirin: yes Recent stressors: no Recurrent headaches: no Visual changes: no Palpitations: no Dyspnea: no Chest pain: no Lower extremity edema: no Dizzy/lightheaded: no  She currently lives with: daughter Menopausal Symptoms: no  Functional Status Survey: Is the patient deaf or have difficulty hearing?: No Does the patient have difficulty seeing, even when wearing glasses/contacts?: No Does the patient have difficulty concentrating, remembering, or making decisions?: No Does the patient have difficulty walking or climbing stairs?: Yes Does the patient have difficulty dressing or bathing?: No Does the patient have difficulty doing errands alone such as visiting a doctor's office or shopping?: No  Fall Risk  02/02/2017 01/18/2016  Falls in the past year? Yes No  Number falls in past yr: 1 -  Injury with Fall? Yes -    Depression Screen Depression screen [facility] 2/9 02/02/2017 01/18/2016  Decreased Interest 0 0  Down, Depressed, Hopeless 0 0  PHQ - 2 Score 0 0    Advanced Directives Does patient have a HCPOA?    yes Does patient have a  living will or MOST form?  yes  Past Medical History:  Past Medical History:  Diagnosis Date  . Allergy   . Arthritis   . Cancer ([place])    skin  . Diffuse cystic mastopathy 2013  . High cholesterol   . Hypertension 1990's  . Migraine   . Migraines   . Overactive bladder   . Urinary incontinence     Surgical History:  Past Surgical History:  Procedure Laterality Date  . BREAST BIOPSY Right 05/31/93   neg/lump  . COLONOSCOPY  11/2004   Dr. Chauncey Reading  . COLONOSCOPY WITH PROPOFOL N/A 10/15/2016   Procedure: COLONOSCOPY WITH PROPOFOL;  Surgeon: Christene Lye, MD;  Location: [facility] ENDOSCOPY;  Service: Endoscopy;  Laterality: N/A;  . KIDNEY SURGERY  1998  . LITHOTRIPSY  1998    Medications:  Current Outpatient Prescriptions on File Prior to Visit  Medication Sig  . aspirin 81 MG tablet Take 81 mg by mouth daily.  . calcium carbonate (OS-CAL) 600 MG TABS Take 600 mg by mouth 2 (two) times daily with a meal.  . Cholecalciferol (VITAMIN D PO) Take 1 capsule by mouth daily.  . cyclobenzaprine (FLEXERIL) 10 MG tablet TAKE 1 TABLET BY MOUTH TWICE A DAY AS NEEDED SPASM  . loratadine (CLARITIN) 10 MG tablet Take 10 mg by mouth daily.  . Multiple Vitamin (MULTIVITAMIN) tablet Take 1 tablet by mouth daily.   No current facility-administered medications on file prior to visit.     Allergies:  No Known Allergies  Social History:  Social History   Social History  . Marital status: Widowed  Spouse name: N/A  . Number of children: N/A  . Years of education: N/A   Occupational History  . Not on file.   Social History Main Topics  . Smoking status: Never Smoker  . Smokeless tobacco: Never Used  . Alcohol use No  . Drug use: No  . Sexual activity: No   Other Topics Concern  . Not on file   Social History Narrative  . No narrative on file   History  Smoking Status  . Never Smoker  Smokeless Tobacco  . Never Used   History  Alcohol Use No    Family History:    Family History  Problem Relation Age of Onset  . Lung cancer Father   . Stroke Mother   . Hypertension Sister   . Arthritis Sister     Past medical history, surgical history, medications, allergies, family history and social history reviewed with patient today and changes made to appropriate areas of the chart.   Review of Systems  Constitutional: Positive for diaphoresis. Negative for chills, fever, malaise/fatigue and weight loss.  HENT: Negative.   Eyes: Negative.   Respiratory: Negative.   Cardiovascular: Positive for leg swelling. Negative for chest pain, palpitations, orthopnea, claudication and PND.  Gastrointestinal: Positive for constipation. Negative for abdominal pain, blood in stool, diarrhea, heartburn, melena, nausea and vomiting.  Genitourinary: Negative.   Musculoskeletal: Negative.   Skin: Negative.        Dry, cracked skin on her hands   Neurological: Negative.  Negative for weakness.  Endo/Heme/Allergies: Positive for polydipsia. Negative for environmental allergies. Bruises/bleeds easily.  Psychiatric/Behavioral: Negative.     All other ROS negative except what is listed above and in the HPI.      Objective:    BP 123/76 (BP Location: Left Arm, Patient Position: Sitting, Cuff Size: Large)   Pulse 69   Temp 98.3 F (36.8 C) (Oral)   Resp 17   Ht 5\' 4"  (1.626 m)   Wt 259 lb (117.5 kg)   SpO2 96%   BMI 44.46 kg/m   Wt Readings from Last 3 Encounters:  02/02/17 259 lb (117.5 kg)  10/15/16 256 lb (116.1 kg)  09/24/16 258 lb (117 kg)     Hearing Screening   125Hz  250Hz  500Hz  1000Hz  2000Hz  3000Hz  4000Hz  6000Hz  8000Hz   Right ear:           Left ear:   25 25  25  40    Comments: Hearing was not performed on right ear due to instrument went dead because had not been charged properly   Visual Acuity Screening   Right eye Left eye Both eyes  Without correction:     With correction: 20/25 20/30 20/25     Physical Exam  Constitutional: She is oriented  to person, place, and time. She appears well-developed and well-nourished. No distress.  HENT:  Head: Normocephalic and atraumatic.  Right Ear: Hearing, tympanic membrane, external ear and ear canal normal.  Left Ear: Hearing, tympanic membrane, external ear and ear canal normal.  Nose: Nose normal.  Mouth/Throat: Uvula is midline, oropharynx is clear and moist and mucous membranes are normal. No oropharyngeal exudate.  Eyes: Conjunctivae, EOM and lids are normal. Pupils are equal, round, and reactive to light. Right eye exhibits no discharge. Left eye exhibits no discharge. No scleral icterus.  Neck: Normal range of motion. Neck supple. No JVD present. No tracheal deviation present. No thyromegaly present.  Cardiovascular: Normal rate, regular rhythm, normal heart sounds and intact distal  pulses.  Exam reveals no gallop and no friction rub.   No murmur heard. Pulmonary/Chest: Effort normal and breath sounds normal. No stridor. No respiratory distress. She has no wheezes. She has no rales. She exhibits no tenderness.  Abdominal: Soft. Bowel sounds are normal. She exhibits no distension and no mass. There is no tenderness. There is no rebound and no guarding.  Genitourinary:  Genitourinary Comments: Breast and pelvic exams deferred with shared decision making.   Musculoskeletal: Normal range of motion. She exhibits no edema, tenderness or deformity.  Lymphadenopathy:    She has no cervical adenopathy.  Neurological: She is alert and oriented to person, place, and time. She has normal reflexes. She displays normal reflexes. No cranial nerve deficit. She exhibits normal muscle tone. Coordination normal.  Skin: Skin is warm, dry and intact. No rash noted. She is not diaphoretic. No erythema. No pallor.  Psychiatric: She has a normal mood and affect. Her speech is normal and behavior is normal. Judgment and thought content normal. Cognition and memory are normal.  Nursing note and vitals  reviewed.   6CIT Screen 02/02/2017  What Year? 0 points  What month? 0 points  What time? 0 points  Count back from 20 0 points  Months in reverse 0 points  Repeat phrase 4 points  Total Score 4     Results for orders placed or performed in visit on 07/25/16  Comprehensive metabolic panel  Result Value Ref Range   Glucose 91 65 - 99 mg/dL   BUN 30 (H) 8 - 27 mg/dL   Creatinine, Ser 0.77 0.57 - 1.00 mg/dL   GFR calc non Af Amer 80 >59 mL/min/1.73   GFR calc Af Amer 92 >59 mL/min/1.73   BUN/Creatinine Ratio 39 (H) 12 - 28   Sodium 140 134 - 144 mmol/L   Potassium 4.3 3.5 - 5.2 mmol/L   Chloride 102 96 - 106 mmol/L   CO2 22 18 - 29 mmol/L   Calcium 9.8 8.7 - 10.3 mg/dL   Total Protein 6.2 6.0 - 8.5 g/dL   Albumin 3.9 3.6 - 4.8 g/dL   Globulin, Total 2.3 1.5 - 4.5 g/dL   Albumin/Globulin Ratio 1.7 1.2 - 2.2   Bilirubin Total 0.5 0.0 - 1.2 mg/dL   Alkaline Phosphatase 85 39 - 117 IU/L   AST 23 0 - 40 IU/L   ALT 21 0 - 32 IU/L  Lipid Panel Piccolo, Waived  Result Value Ref Range   Cholesterol Piccolo, Waived 164 <200 mg/dL   HDL Chol Piccolo, Waived 53 (L) >59 mg/dL   Triglycerides Piccolo,Waived 128 <150 mg/dL   Chol/HDL Ratio Piccolo,Waive 3.1 mg/dL   LDL Chol Calc Piccolo Waived 86 <100 mg/dL   VLDL Chol Calc Piccolo,Waive 26 <30 mg/dL      Assessment & Plan:   Problem List Items Addressed This Visit      Cardiovascular and Mediastinum   Hypertension    Under good control. Continue current regimen. Continue to monitor. Call with any concerns.       Relevant Medications   pravastatin (PRAVACHOL) 20 MG tablet   lisinopril-hydrochlorothiazide (PRINZIDE,ZESTORETIC) 20-25 MG tablet   Other Relevant Orders   CBC with Differential/Platelet   Lipid panel   Comprehensive metabolic panel   UA/M w/rflx Culture, Routine   Microalbumin, Urine Waived     Genitourinary   OAB (overactive bladder)    Under good control. Continue current regimen. Continue to monitor. Call  with any concerns.  Relevant Orders   UA/M w/rflx Culture, Routine     Other   High cholesterol    Under good control. Continue current regimen. Continue to monitor. Call with any concerns.       Relevant Medications   pravastatin (PRAVACHOL) 20 MG tablet   lisinopril-hydrochlorothiazide (PRINZIDE,ZESTORETIC) 20-25 MG tablet   Other Relevant Orders   CBC with Differential/Platelet   Lipid panel   Comprehensive metabolic panel    Other Visit Diagnoses    Medicare annual wellness visit, subsequent    -  Primary   Vaccines updated. Screening labs checked. Pap N/A, Colonoscopy up to date, Mammo and Bone Density ordered. Preventative care as below.   Thyroid disorder screen       Labd drawn today.   Relevant Orders   TSH   Need for hepatitis C screening test       Labs drawn today. Await results   Relevant Orders   Hepatitis C Antibody   Screening for osteoporosis       DEXA ordered today.   Relevant Orders   DG Bone Density   Need for pneumococcal vaccination       Pneumovax given today.   Relevant Orders   Pneumococcal polysaccharide vaccine 23-valent greater than or equal to 2yo subcutaneous/IM (Completed)      Preventative Services:  Health Risk Assessment and Personalized Prevention Plan: Done today Bone Mass Measurements: Ordered today Breast Cancer Screening: due in April- order is in CVD Screening: Done today Cervical Cancer Screening: N/A Colon Cancer Screening: Up to date Depression Screening: Done today Diabetes Screening: Done today Glaucoma Screening: See your eye doctor Hepatitis B vaccine: N/A Hepatitis C screening: Done today HIV Screening: N/A Flu Vaccine: Up to date Lung cancer Screening: N/A Obesity Screening: Done today Pneumonia Vaccines (2): 2nd pneumonia vaccine updated today STI Screening: N/A  Follow up plan: Return in about 6 months (around 08/05/2017) for Follow up.   LABORATORY TESTING:  - Pap smear: not  applicable  IMMUNIZATIONS:   - Tdap: Tetanus vaccination status reviewed: last tetanus booster within 10 years. - Influenza: Up to date - Pneumovax: Administered today - Prevnar: Up to date - Zostavax vaccine: Will check on insurance  SCREENING: -Mammogram: Due in April- order in  - Colonoscopy: Up to date  - Bone Density: Ordered today  -Hearing Test: Ordered today -Spirometry: Not applicable   PATIENT COUNSELING:   Advised to take 1 mg of folate supplement per day if capable of pregnancy.   Sexuality: Discussed sexually transmitted diseases, partner selection, use of condoms, avoidance of unintended pregnancy  and contraceptive alternatives.   Advised to avoid cigarette smoking.  I discussed with the patient that most people either abstain from alcohol or drink within safe limits (<=14/week and <=4 drinks/occasion for males, <=7/weeks and <= 3 drinks/occasion for females) and that the risk for alcohol disorders and other health effects rises proportionally with the number of drinks per week and how often a drinker exceeds daily limits.  Discussed cessation/primary prevention of drug use and availability of treatment for abuse.   Diet: Encouraged to adjust caloric intake to maintain  or achieve ideal body weight, to reduce intake of dietary saturated fat and total fat, to limit sodium intake by avoiding high sodium foods and not adding table salt, and to maintain adequate dietary potassium and calcium preferably from fresh fruits, vegetables, and low-fat dairy products.    stressed the importance of regular exercise  Injury prevention: Discussed safety belts, safety helmets, smoke  detector, smoking near bedding or upholstery.   Dental health: Discussed importance of regular tooth brushing, flossing, and dental visits.    NEXT PREVENTATIVE PHYSICAL DUE IN 1 YEAR. Return in about 6 months (around 08/05/2017) for Follow up.

## 2017-02-02 NOTE — Assessment & Plan Note (Signed)
Under good control. Continue current regimen. Continue to monitor. Call with any concerns. 

## 2017-02-02 NOTE — Patient Instructions (Addendum)
Bag balm- for the hands [facility]- help with the swelling  Preventative Services:  Health Risk Assessment and Personalized Prevention Plan: Done today Bone Mass Measurements: Ordered today Breast Cancer Screening: due in April- order is in CVD Screening: Done today Cervical Cancer Screening: N/A Colon Cancer Screening: Up to date Depression Screening: Done today Diabetes Screening: Done today Glaucoma Screening: See your eye doctor Hepatitis B vaccine: N/A Hepatitis C screening: Done today HIV Screening: N/A Flu Vaccine: Up to date Lung cancer Screening: N/A Obesity Screening: Done today Pneumonia Vaccines (2): 2nd pneumonia vaccine updated today  STI Screening: N/AHealth Maintenance, Female Adopting a healthy lifestyle and getting preventive care can go a long way to promote health and wellness. Talk with your health care provider about what schedule of regular examinations is right for you. This is a good chance for you to check in with your provider about disease prevention and staying healthy. In between checkups, there are plenty of things you can do on your own. Experts have done a lot of research about which lifestyle changes and preventive measures are most likely to keep you healthy. Ask your health care provider for more information. Weight and diet Eat a healthy diet  Be sure to include plenty of vegetables, fruits, low-fat dairy products, and lean protein.  Do not eat a lot of foods high in solid fats, added sugars, or salt.  Get regular exercise. This is one of the most important things you can do for your health.  Most adults should exercise for at least 150 minutes each week. The exercise should increase your heart rate and make you sweat (moderate-intensity exercise).  Most adults should also do strengthening exercises at least twice a week. This is in addition to the moderate-intensity exercise. Maintain a healthy weight  Body mass index (BMI) is a measurement  that can be used to identify possible weight problems. It estimates body fat based on height and weight. Your health care provider can help determine your BMI and help you achieve or maintain a healthy weight.  For females 44 years of age and older:  A BMI below 18.5 is considered underweight.  A BMI of 18.5 to 24.9 is normal.  A BMI of 25 to 29.9 is considered overweight.  A BMI of 30 and above is considered obese. Watch levels of cholesterol and blood lipids  You should start having your blood tested for lipids and cholesterol at 69 years of age, then have this test every 5 years.  You may need to have your cholesterol levels checked more often if:  Your lipid or cholesterol levels are high.  You are older than 69 years of age.  You are at high risk for heart disease. Cancer screening Lung Cancer  Lung cancer screening is recommended for adults 14-22 years old who are at high risk for lung cancer because of a history of smoking.  A yearly low-dose CT scan of the lungs is recommended for people who:  Currently smoke.  Have quit within the past 15 years.  Have at least a 30-pack-year history of smoking. A pack year is smoking an average of one pack of cigarettes a day for 1 year.  Yearly screening should continue until it has been 15 years since you quit.  Yearly screening should stop if you develop a health problem that would prevent you from having lung cancer treatment. Breast Cancer  Practice breast self-awareness. This means understanding how your breasts normally appear and feel.  It  also means doing regular breast self-exams. Let your health care provider know about any changes, no matter how small.  If you are in your 20s or 30s, you should have a clinical breast exam (CBE) by a health care provider every 1-3 years as part of a regular health exam.  If you are 6 or older, have a CBE every year. Also consider having a breast X-ray (mammogram) every year.  If  you have a family history of breast cancer, talk to your health care provider about genetic screening.  If you are at high risk for breast cancer, talk to your health care provider about having an MRI and a mammogram every year.  Breast cancer gene (BRCA) assessment is recommended for women who have family members with BRCA-related cancers. BRCA-related cancers include:  Breast.  Ovarian.  Tubal.  Peritoneal cancers.  Results of the assessment will determine the need for genetic counseling and BRCA1 and BRCA2 testing. Cervical Cancer  Your health care provider may recommend that you be screened regularly for cancer of the pelvic organs (ovaries, uterus, and vagina). This screening involves a pelvic examination, including checking for microscopic changes to the surface of your cervix (Pap test). You may be encouraged to have this screening done every 3 years, beginning at age 16.  For women ages 34-65, health care providers may recommend pelvic exams and Pap testing every 3 years, or they may recommend the Pap and pelvic exam, combined with testing for human papilloma virus (HPV), every 5 years. Some types of HPV increase your risk of cervical cancer. Testing for HPV may also be done on women of any age with unclear Pap test results.  Other health care providers may not recommend any screening for nonpregnant women who are considered low risk for pelvic cancer and who do not have symptoms. Ask your health care provider if a screening pelvic exam is right for you.  If you have had past treatment for cervical cancer or a condition that could lead to cancer, you need Pap tests and screening for cancer for at least 20 years after your treatment. If Pap tests have been discontinued, your risk factors (such as having a new sexual partner) need to be reassessed to determine if screening should resume. Some women have medical problems that increase the chance of getting cervical cancer. In these cases,  your health care provider may recommend more frequent screening and Pap tests. Colorectal Cancer  This type of cancer can be detected and often prevented.  Routine colorectal cancer screening usually begins at 69 years of age and continues through 69 years of age.  Your health care provider may recommend screening at an earlier age if you have risk factors for colon cancer.  Your health care provider may also recommend using home test kits to check for hidden blood in the stool.  A small camera at the end of a tube can be used to examine your colon directly (sigmoidoscopy or colonoscopy). This is done to check for the earliest forms of colorectal cancer.  Routine screening usually begins at age 54.  Direct examination of the colon should be repeated every 5-10 years through 69 years of age. However, you may need to be screened more often if early forms of precancerous polyps or small growths are found. Skin Cancer  Check your skin from head to toe regularly.  Tell your health care provider about any new moles or changes in moles, especially if there is a change in a  mole's shape or color.  Also tell your health care provider if you have a mole that is larger than the size of a pencil eraser.  Always use sunscreen. Apply sunscreen liberally and repeatedly throughout the day.  Protect yourself by wearing long sleeves, pants, a wide-brimmed hat, and sunglasses whenever you are outside. Heart disease, diabetes, and high blood pressure  High blood pressure causes heart disease and increases the risk of stroke. High blood pressure is more likely to develop in:  People who have blood pressure in the high end of the normal range (130-139/85-89 mm Hg).  People who are overweight or obese.  People who are African American.  If you are 39-36 years of age, have your blood pressure checked every 3-5 years. If you are 54 years of age or older, have your blood pressure checked every year. You  should have your blood pressure measured twice-once when you are at a hospital or clinic, and once when you are not at a hospital or clinic. Record the average of the two measurements. To check your blood pressure when you are not at a hospital or clinic, you can use:  An automated blood pressure machine at a pharmacy.  A home blood pressure monitor.  If you are between 31 years and 25 years old, ask your health care provider if you should take aspirin to prevent strokes.  Have regular diabetes screenings. This involves taking a blood sample to check your fasting blood sugar level.  If you are at a normal weight and have a low risk for diabetes, have this test once every three years after 69 years of age.  If you are overweight and have a high risk for diabetes, consider being tested at a younger age or more often. Preventing infection Hepatitis B  If you have a higher risk for hepatitis B, you should be screened for this virus. You are considered at high risk for hepatitis B if:  You were born in a country where hepatitis B is common. Ask your health care provider which countries are considered high risk.  Your parents were born in a high-risk country, and you have not been immunized against hepatitis B (hepatitis B vaccine).  You have HIV or AIDS.  You use needles to inject street drugs.  You live with someone who has hepatitis B.  You have had sex with someone who has hepatitis B.  You get hemodialysis treatment.  You take certain medicines for conditions, including cancer, organ transplantation, and autoimmune conditions. Hepatitis C  Blood testing is recommended for:  Everyone born from [birth year] through 1963.  Anyone with known risk factors for hepatitis C. Sexually transmitted infections (STIs)  You should be screened for sexually transmitted infections (STIs) including gonorrhea and chlamydia if:  You are sexually active and are younger than 69 years of age.  You are  older than 69 years of age and your health care provider tells you that you are at risk for this type of infection.  Your sexual activity has changed since you were last screened and you are at an increased risk for chlamydia or gonorrhea. Ask your health care provider if you are at risk.  If you do not have HIV, but are at risk, it may be recommended that you take a prescription medicine daily to prevent HIV infection. This is called pre-exposure prophylaxis (PrEP). You are considered at risk if:  You are sexually active and do not regularly use condoms or know the HIV  status of your partner(s).  You take drugs by injection.  You are sexually active with a partner who has HIV. Talk with your health care provider about whether you are at high risk of being infected with HIV. If you choose to begin PrEP, you should first be tested for HIV. You should then be tested every 3 months for as long as you are taking PrEP. Pregnancy  If you are premenopausal and you may become pregnant, ask your health care provider about preconception counseling.  If you may become pregnant, take 400 to 800 micrograms (mcg) of folic acid every day.  If you want to prevent pregnancy, talk to your health care provider about birth control (contraception). Osteoporosis and menopause  Osteoporosis is a disease in which the bones lose minerals and strength with aging. This can result in serious bone fractures. Your risk for osteoporosis can be identified using a bone density scan.  If you are 68 years of age or older, or if you are at risk for osteoporosis and fractures, ask your health care provider if you should be screened.  Ask your health care provider whether you should take a calcium or vitamin D supplement to lower your risk for osteoporosis.  Menopause may have certain physical symptoms and risks.  Hormone replacement therapy may reduce some of these symptoms and risks. Talk to your health care provider  about whether hormone replacement therapy is right for you. Follow these instructions at home:  Schedule regular health, dental, and eye exams.  Stay current with your immunizations.  Do not use any tobacco products including cigarettes, chewing tobacco, or electronic cigarettes.  If you are pregnant, do not drink alcohol.  If you are breastfeeding, limit how much and how often you drink alcohol.  Limit alcohol intake to no more than 1 drink per day for nonpregnant women. One drink equals 12 ounces of beer, 5 ounces of wine, or 1 ounces of hard liquor.  Do not use street drugs.  Do not share needles.  Ask your health care provider for help if you need support or information about quitting drugs.  Tell your health care provider if you often feel depressed.  Tell your health care provider if you have ever been abused or do not feel safe at home. This information is not intended to replace advice given to you by your health care provider. Make sure you discuss any questions you have with your health care provider. Document Released: 05/19/2011 Document Revised: 04/10/2016 Document Reviewed: 08/07/2015 Elsevier Interactive Patient Education  2017 [place] Maintenance for Postmenopausal Women Menopause is a normal process in which your reproductive ability comes to an end. This process happens gradually over a span of months to years, usually between the ages of 1 and 73. Menopause is complete when you have missed 12 consecutive menstrual periods. It is important to talk with your health care provider about some of the most common conditions that affect postmenopausal women, such as heart disease, cancer, and bone loss (osteoporosis). Adopting a healthy lifestyle and getting preventive care can help to promote your health and wellness. Those actions can also lower your chances of developing some of these common conditions. What should I know about menopause? During  menopause, you may experience a number of symptoms, such as:  Moderate-to-severe hot flashes.  Night sweats.  Decrease in sex drive.  Mood swings.  Headaches.  Tiredness.  Irritability.  Memory problems.  Insomnia. Choosing to treat or not to treat  menopausal changes is an individual decision that you make with your health care provider. What should I know about hormone replacement therapy and supplements? Hormone therapy products are effective for treating symptoms that are associated with menopause, such as hot flashes and night sweats. Hormone replacement carries certain risks, especially as you become older. If you are thinking about using estrogen or estrogen with progestin treatments, discuss the benefits and risks with your health care provider. What should I know about heart disease and stroke? Heart disease, heart attack, and stroke become more likely as you age. This may be due, in part, to the hormonal changes that your body experiences during menopause. These can affect how your body processes dietary fats, triglycerides, and cholesterol. Heart attack and stroke are both medical emergencies. There are many things that you can do to help prevent heart disease and stroke:  Have your blood pressure checked at least every 1-2 years. High blood pressure causes heart disease and increases the risk of stroke.  If you are 9-61 years old, ask your health care provider if you should take aspirin to prevent a heart attack or a stroke.  Do not use any tobacco products, including cigarettes, chewing tobacco, or electronic cigarettes. If you need help quitting, ask your health care provider.  It is important to eat a healthy diet and maintain a healthy weight.  Be sure to include plenty of vegetables, fruits, low-fat dairy products, and lean protein.  Avoid eating foods that are high in solid fats, added sugars, or salt (sodium).  Get regular exercise. This is one of the most  important things that you can do for your health.  Try to exercise for at least 150 minutes each week. The type of exercise that you do should increase your heart rate and make you sweat. This is known as moderate-intensity exercise.  Try to do strengthening exercises at least twice each week. Do these in addition to the moderate-intensity exercise.  Know your numbers.Ask your health care provider to check your cholesterol and your blood glucose. Continue to have your blood tested as directed by your health care provider. What should I know about cancer screening? There are several types of cancer. Take the following steps to reduce your risk and to catch any cancer development as early as possible. Breast Cancer  Practice breast self-awareness.  This means understanding how your breasts normally appear and feel.  It also means doing regular breast self-exams. Let your health care provider know about any changes, no matter how small.  If you are 67 or older, have a clinician do a breast exam (clinical breast exam or CBE) every year. Depending on your age, family history, and medical history, it may be recommended that you also have a yearly breast X-ray (mammogram).  If you have a family history of breast cancer, talk with your health care provider about genetic screening.  If you are at high risk for breast cancer, talk with your health care provider about having an MRI and a mammogram every year.  Breast cancer (BRCA) gene test is recommended for women who have family members with BRCA-related cancers. Results of the assessment will determine the need for genetic counseling and BRCA1 and for BRCA2 testing. BRCA-related cancers include these types:  Breast. This occurs in males or females.  Ovarian.  Tubal. This may also be called fallopian tube cancer.  Cancer of the abdominal or pelvic lining (peritoneal cancer).  Prostate.  Pancreatic. Cervical, Uterine, and Ovarian Cancer  Your health care provider may recommend that you be screened regularly for cancer of the pelvic organs. These include your ovaries, uterus, and vagina. This screening involves a pelvic exam, which includes checking for microscopic changes to the surface of your cervix (Pap test).  For women ages 21-65, health care providers may recommend a pelvic exam and a Pap test every three years. For women ages 64-65, they may recommend the Pap test and pelvic exam, combined with testing for human papilloma virus (HPV), every five years. Some types of HPV increase your risk of cervical cancer. Testing for HPV may also be done on women of any age who have unclear Pap test results.  Other health care providers may not recommend any screening for nonpregnant women who are considered low risk for pelvic cancer and have no symptoms. Ask your health care provider if a screening pelvic exam is right for you.  If you have had past treatment for cervical cancer or a condition that could lead to cancer, you need Pap tests and screening for cancer for at least 20 years after your treatment. If Pap tests have been discontinued for you, your risk factors (such as having a new sexual partner) need to be reassessed to determine if you should start having screenings again. Some women have medical problems that increase the chance of getting cervical cancer. In these cases, your health care provider may recommend that you have screening and Pap tests more often.  If you have a family history of uterine cancer or ovarian cancer, talk with your health care provider about genetic screening.  If you have vaginal bleeding after reaching menopause, tell your health care provider.  There are currently no reliable tests available to screen for ovarian cancer. Lung Cancer  Lung cancer screening is recommended for adults 7-57 years old who are at high risk for lung cancer because of a history of smoking. A yearly low-dose CT scan of the  lungs is recommended if you:  Currently smoke.  Have a history of at least 30 pack-years of smoking and you currently smoke or have quit within the past 15 years. A pack-year is smoking an average of one pack of cigarettes per day for one year. Yearly screening should:  Continue until it has been 15 years since you quit.  Stop if you develop a health problem that would prevent you from having lung cancer treatment. Colorectal Cancer  This type of cancer can be detected and can often be prevented.  Routine colorectal cancer screening usually begins at age 36 and continues through age 60.  If you have risk factors for colon cancer, your health care provider may recommend that you be screened at an earlier age.  If you have a family history of colorectal cancer, talk with your health care provider about genetic screening.  Your health care provider may also recommend using home test kits to check for hidden blood in your stool.  A small camera at the end of a tube can be used to examine your colon directly (sigmoidoscopy or colonoscopy). This is done to check for the earliest forms of colorectal cancer.  Direct examination of the colon should be repeated every 5-10 years until age 53. However, if early forms of precancerous polyps or small growths are found or if you have a family history or genetic risk for colorectal cancer, you may need to be screened more often. Skin Cancer  Check your skin from head to toe regularly.  Monitor any  moles. Be sure to tell your health care provider:  About any new moles or changes in moles, especially if there is a change in a mole's shape or color.  If you have a mole that is larger than the size of a pencil eraser.  If any of your family members has a history of skin cancer, especially at a young age, talk with your health care provider about genetic screening.  Always use sunscreen. Apply sunscreen liberally and repeatedly throughout the  day.  Whenever you are outside, protect yourself by wearing long sleeves, pants, a wide-brimmed hat, and sunglasses. What should I know about osteoporosis? Osteoporosis is a condition in which bone destruction happens more quickly than new bone creation. After menopause, you may be at an increased risk for osteoporosis. To help prevent osteoporosis or the bone fractures that can happen because of osteoporosis, the following is recommended:  If you are 106-59 years old, get at least 1,000 mg of calcium and at least 600 mg of vitamin D per day.  If you are older than age 53 but younger than age 74, get at least 1,200 mg of calcium and at least 600 mg of vitamin D per day.  If you are older than age 27, get at least 1,200 mg of calcium and at least 800 mg of vitamin D per day. Smoking and excessive alcohol intake increase the risk of osteoporosis. Eat foods that are rich in calcium and vitamin D, and do weight-bearing exercises several times each week as directed by your health care provider. What should I know about how menopause affects my mental health? Depression may occur at any age, but it is more common as you become older. Common symptoms of depression include:  Low or sad mood.  Changes in sleep patterns.  Changes in appetite or eating patterns.  Feeling an overall lack of motivation or enjoyment of activities that you previously enjoyed.  Frequent crying spells. Talk with your health care provider if you think that you are experiencing depression. What should I know about immunizations? It is important that you get and maintain your immunizations. These include:  Tetanus, diphtheria, and pertussis (Tdap) booster vaccine.  Influenza every year before the flu season begins.  Pneumonia vaccine.  Shingles vaccine. Your health care provider may also recommend other immunizations. This information is not intended to replace advice given to you by your health care provider. Make  sure you discuss any questions you have with your health care provider. Document Released: 12/26/2005 Document Revised: 05/23/2016 Document Reviewed: 08/07/2015 Elsevier Interactive Patient Education  2017 [place]. Pneumococcal Polysaccharide Vaccine: What You Need to Know 1. Why get vaccinated? Vaccination can protect older adults (and some children and younger adults) from pneumococcal disease. Pneumococcal disease is caused by bacteria that can spread from person to person through close contact. It can cause ear infections, and it can also lead to more serious infections of the:  Lungs (pneumonia),  Blood (bacteremia), and  Covering of the brain and spinal cord (meningitis). Meningitis can cause deafness and brain damage, and it can be fatal. Anyone can get pneumococcal disease, but children under 80 years of age, people with certain medical conditions, adults over 64 years of age, and cigarette smokers are at the highest risk. About 18,000 older adults die each year from pneumococcal disease in the [place]. Treatment of pneumococcal infections with penicillin and other drugs used to be more effective. But some strains of the disease have become resistant to  these drugs. This makes prevention of the disease, through vaccination, even more important. 2. Pneumococcal polysaccharide vaccine (PPSV23) Pneumococcal polysaccharide vaccine (PPSV23) protects against 23 types of pneumococcal bacteria. It will not prevent all pneumococcal disease. PPSV23 is recommended for:  All adults 26 years of age and older,  Anyone 2 through 69 years of age with certain long-term health problems,  Anyone 2 through 69 years of age with a weakened immune system,  Adults 20 through 69 years of age who smoke cigarettes or have asthma. Most people need only one dose of PPSV. A second dose is recommended for certain high-risk groups. People 62 and older should get a dose even if they have gotten one or  more doses of the vaccine before they turned 65. Your healthcare provider can give you more information about these recommendations. Most healthy adults develop protection within 2 to 3 weeks of getting the shot. 3. Some people should not get this vaccine  Anyone who has had a life-threatening allergic reaction to PPSV should not get another dose.  Anyone who has a severe allergy to any component of PPSV should not receive it. Tell your provider if you have any severe allergies.  Anyone who is moderately or severely ill when the shot is scheduled may be asked to wait until they recover before getting the vaccine. Someone with a mild illness can usually be vaccinated.  Children less than 16 years of age should not receive this vaccine.  There is no evidence that PPSV is harmful to either a pregnant woman or to her fetus. However, as a precaution, women who need the vaccine should be vaccinated before becoming pregnant, if possible. 4. Risks of a vaccine reaction With any medicine, including vaccines, there is a chance of side effects. These are usually mild and go away on their own, but serious reactions are also possible. About half of people who get PPSV have mild side effects, such as redness or pain where the shot is given, which go away within about two days. Less than 1 out of 100 people develop a fever, muscle aches, or more severe local reactions. Problems that could happen after any vaccine:   People sometimes faint after a medical procedure, including vaccination. Sitting or lying down for about 15 minutes can help prevent fainting, and injuries caused by a fall. Tell your doctor if you feel dizzy, or have vision changes or ringing in the ears.  Some people get severe pain in the shoulder and have difficulty moving the arm where a shot was given. This happens very rarely.  Any medication can cause a severe allergic reaction. Such reactions from a vaccine are very rare, estimated at  about 1 in a million doses, and would happen within a few minutes to a few hours after the vaccination. As with any medicine, there is a very remote chance of a vaccine causing a serious injury or death. The safety of vaccines is always being monitored. For more information, visit: http://www.aguilar.org/ 5. What if there is a serious reaction? What should I look for?  Look for anything that concerns you, such as signs of a severe allergic reaction, very high fever, or unusual behavior. Signs of a severe allergic reaction can include hives, swelling of the face and throat, difficulty breathing, a fast heartbeat, dizziness, and weakness. These would usually start a few minutes to a few hours after the vaccination. What should I do?  If you think it is a severe allergic reaction or  other emergency that can't wait, call 9-1-1 or get to the nearest hospital. Otherwise, call your doctor. Afterward, the reaction should be reported to the Vaccine Adverse Event Reporting System (VAERS). Your doctor might file this report, or you can do it yourself through the VAERS web site at www.vaers.SamedayNews.es, or by calling (775) 433-7357. VAERS does not give medical advice.  6. How can I learn more?  Ask your doctor. He or she can give you the vaccine package insert or suggest other sources of information.  Call your local or state health department.  Contact the Centers for Disease Control and Prevention (CDC):  Call 803-190-9388 (1-800-CDC-INFO) or  Visit CDC's website at http://hunter.com/ CDC Pneumococcal Polysaccharide Vaccine VIS (03/10/14) This information is not intended to replace advice given to you by your health care provider. Make sure you discuss any questions you have with your health care provider. Document Released: 08/31/2006 Document Revised: 07/24/2016 Document Reviewed: 07/24/2016 Elsevier Interactive Patient Education  2017 [employer]  Shoulder Exercises Ask your health care  provider which exercises are safe for you. Do exercises exactly as told by your health care provider and adjust them as directed. It is normal to feel mild stretching, pulling, tightness, or discomfort as you do these exercises, but you should stop right away if you feel sudden pain or your pain gets worse.Do not begin these exercises until told by your health care provider. RANGE OF MOTION EXERCISES  These exercises warm up your muscles and joints and improve the movement and flexibility of your shoulder. These exercises also help to relieve pain, numbness, and tingling. These exercises involve stretching your injured shoulder directly. Exercise A: Pendulum   1. Stand near a wall or a surface that you can hold onto for balance. 2. Bend at the waist and let your left / right arm hang straight down. Use your other arm to support you. Keep your back straight and do not lock your knees. 3. Relax your left / right arm and shoulder muscles, and move your hips and your trunk so your left / right arm swings freely. Your arm should swing because of the motion of your body, not because you are using your arm or shoulder muscles. 4. Keep moving your body so your arm swings in the following directions, as told by your health care provider:  Side to side.  Forward and backward.  In clockwise and counterclockwise circles. 5. Continue each motion for __________ seconds, or for as long as told by your health care provider. 6. Slowly return to the starting position. Repeat __________ times. Complete this exercise __________ times a day. Exercise B:Flexion, Standing   1. Stand and hold a broomstick, a cane, or a similar object. Place your hands a little more than shoulder-width apart on the object. Your left / right hand should be palm-up, and your other hand should be palm-down. 2. Keep your elbow straight and keep your shoulder muscles relaxed. Push the stick down with your healthy arm to raise your left /  right arm in front of your body, and then over your head until you feel a stretch in your shoulder.  Avoid shrugging your shoulder while you raise your arm. Keep your shoulder blade tucked down toward the middle of your back. 3. Hold for __________ seconds. 4. Slowly return to the starting position. Repeat __________ times. Complete this exercise __________ times a day. Exercise C: Abduction, Standing  1. Stand and hold a broomstick, a cane, or a similar object. Place your  hands a little more than shoulder-width apart on the object. Your left / right hand should be palm-up, and your other hand should be palm-down. 2. While keeping your elbow straight and your shoulder muscles relaxed, push the stick across your body toward your left / right side. Raise your left / right arm to the side of your body and then over your head until you feel a stretch in your shoulder.  Do not raise your arm above shoulder height, unless your health care provider tells you to do that.  Avoid shrugging your shoulder while you raise your arm. Keep your shoulder blade tucked down toward the middle of your back. 3. Hold for __________ seconds. 4. Slowly return to the starting position. Repeat __________ times. Complete this exercise __________ times a day. Exercise D:Internal Rotation   1. Place your left / right hand behind your back, palm-up. 2. Use your other hand to dangle an exercise band, a towel, or a similar object over your shoulder. Grasp the band with your left / right hand so you are holding onto both ends. 3. Gently pull up on the band until you feel a stretch in the front of your left / right shoulder.  Avoid shrugging your shoulder while you raise your arm. Keep your shoulder blade tucked down toward the middle of your back. 4. Hold for __________ seconds. 5. Release the stretch by letting go of the band and lowering your hands. Repeat __________ times. Complete this exercise __________ times a  day. STRETCHING EXERCISES  These exercises warm up your muscles and joints and improve the movement and flexibility of your shoulder. These exercises also help to relieve pain, numbness, and tingling. These exercises are done using your healthy shoulder to help stretch the muscles of your injured shoulder. Exercise E: Warehouse manager (External Rotation and Abduction)   1. Stand in a doorway with one of your feet slightly in front of the other. This is called a staggered stance. If you cannot reach your forearms to the door frame, stand facing a corner of a room. 2. Choose one of the following positions as told by your health care provider:  Place your hands and forearms on the door frame above your head.  Place your hands and forearms on the door frame at the height of your head.  Place your hands on the door frame at the height of your elbows. 3. Slowly move your weight onto your front foot until you feel a stretch across your chest and in the front of your shoulders. Keep your head and chest upright and keep your abdominal muscles tight. 4. Hold for __________ seconds. 5. To release the stretch, shift your weight to your back foot. Repeat __________ times. Complete this stretch __________ times a day. Exercise F:Extension, Standing  1. Stand and hold a broomstick, a cane, or a similar object behind your back.  Your hands should be a little wider than shoulder-width apart.  Your palms should face away from your back. 2. Keeping your elbows straight and keeping your shoulder muscles relaxed, move the stick away from your body until you feel a stretch in your shoulder.  Avoid shrugging your shoulders while you move the stick. Keep your shoulder blade tucked down toward the middle of your back. 3. Hold for __________ seconds. 4. Slowly return to the starting position. Repeat __________ times. Complete this exercise __________ times a day. STRENGTHENING EXERCISES  These exercises build  strength and endurance in your shoulder. Endurance is  the ability to use your muscles for a long time, even after they get tired. Exercise G:External Rotation   1. Sit in a stable chair without armrests. 2. Secure an exercise band at elbow height on your left / right side. 3. Place a soft object, such as a folded towel or a small pillow, between your left / right upper arm and your body to move your elbow a few inches away (about 10 cm) from your side. 4. Hold the end of the band so it is tight and there is no slack. 5. Keeping your elbow pressed against the soft object, move your left / right forearm out, away from your abdomen. Keep your body steady so only your forearm moves. 6. Hold for __________ seconds. 7. Slowly return to the starting position. Repeat __________ times. Complete this exercise __________ times a day. Exercise H:Shoulder Abduction   1. Sit in a stable chair without armrests, or stand. 2. Hold a __________ weight in your left / right hand, or hold an exercise band with both hands. 3. Start with your arms straight down and your left / right palm facing in, toward your body. 4. Slowly lift your left / right hand out to your side. Do not lift your hand above shoulder height unless your health care provider tells you that this is safe.  Keep your arms straight.  Avoid shrugging your shoulder while you do this movement. Keep your shoulder blade tucked down toward the middle of your back. 5. Hold for __________ seconds. 6. Slowly lower your arm, and return to the starting position. Repeat __________ times. Complete this exercise __________ times a day. Exercise I:Shoulder Extension  1. Sit in a stable chair without armrests, or stand. 2. Secure an exercise band to a stable object in front of you where it is at shoulder height. 3. Hold one end of the exercise band in each hand. Your palms should face each other. 4. Straighten your elbows and lift your hands up to shoulder  height. 5. Step back, away from the secured end of the exercise band, until the band is tight and there is no slack. 6. Squeeze your shoulder blades together as you pull your hands down to the sides of your thighs. Stop when your hands are straight down by your sides. Do not let your hands go behind your body. 7. Hold for __________ seconds. 8. Slowly return to the starting position. Repeat __________ times. Complete this exercise __________ times a day. Exercise J:Standing Shoulder Row  1. Sit in a stable chair without armrests, or stand. 2. Secure an exercise band to a stable object in front of you so it is at waist height. 3. Hold one end of the exercise band in each hand. Your palms should be in a thumbs-up position. 4. Bend each of your elbows to an "L" shape (about 90 degrees) and keep your upper arms at your sides. 5. Step back until the band is tight and there is no slack. 6. Slowly pull your elbows back behind you. 7. Hold for __________ seconds. 8. Slowly return to the starting position. Repeat __________ times. Complete this exercise __________ times a day. Exercise K:Shoulder Press-Ups   1. Sit in a stable chair that has armrests. Sit upright, with your feet flat on the floor. 2. Put your hands on the armrests so your elbows are bent and your fingers are pointing forward. Your hands should be about even with the sides of your body. 3. Push down  on the armrests and use your arms to lift yourself off of the chair. Straighten your elbows and lift yourself up as much as you comfortably can.  Move your shoulder blades down, and avoid letting your shoulders move up toward your ears.  Keep your feet on the ground. As you get stronger, your feet should support less of your body weight as you lift yourself up. 4. Hold for __________ seconds. 5. Slowly lower yourself back into the chair. Repeat __________ times. Complete this exercise __________ times a day. Exercise L: Wall Push-Ups    1. Stand so you are facing a stable wall. Your feet should be about one arm-length away from the wall. 2. Lean forward and place your palms on the wall at shoulder height. 3. Keep your feet flat on the floor as you bend your elbows and lean forward toward the wall. 4. Hold for __________ seconds. 5. Straighten your elbows to push yourself back to the starting position. Repeat __________ times. Complete this exercise __________ times a day. This information is not intended to replace advice given to you by your health care provider. Make sure you discuss any questions you have with your health care provider. Document Released: 09/17/2005 Document Revised: 07/28/2016 Document Reviewed: 07/15/2015 Elsevier Interactive Patient Education  2017 [employer].

## 2017-02-03 LAB — CBC WITH DIFFERENTIAL/PLATELET
BASOS: 1 %
Basophils Absolute: 0.1 10*3/uL (ref 0.0–0.2)
EOS (ABSOLUTE): 0.2 10*3/uL (ref 0.0–0.4)
EOS: 5 %
HEMATOCRIT: 34.9 % (ref 34.0–46.6)
Hemoglobin: 11.8 g/dL (ref 11.1–15.9)
IMMATURE GRANS (ABS): 0 10*3/uL (ref 0.0–0.1)
IMMATURE GRANULOCYTES: 0 %
LYMPHS: 37 %
Lymphocytes Absolute: 1.9 10*3/uL (ref 0.7–3.1)
MCH: 32.2 pg (ref 26.6–33.0)
MCHC: 33.8 g/dL (ref 31.5–35.7)
MCV: 95 fL (ref 79–97)
Monocytes Absolute: 0.5 10*3/uL (ref 0.1–0.9)
Monocytes: 10 %
NEUTROS PCT: 47 %
Neutrophils Absolute: 2.5 10*3/uL (ref 1.4–7.0)
Platelets: 265 10*3/uL (ref 150–379)
RBC: 3.66 x10E6/uL — ABNORMAL LOW (ref 3.77–5.28)
RDW: 13.1 % (ref 12.3–15.4)
WBC: 5.1 10*3/uL (ref 3.4–10.8)

## 2017-02-03 LAB — LIPID PANEL
CHOL/HDL RATIO: 3.9 ratio (ref 0.0–4.4)
Cholesterol, Total: 154 mg/dL (ref 100–199)
HDL: 39 mg/dL — ABNORMAL LOW (ref >39)
LDL CALC: 85 mg/dL (ref 0–99)
TRIGLYCERIDES: 148 mg/dL (ref 0–149)
VLDL Cholesterol Cal: 30 mg/dL (ref 5–40)

## 2017-02-03 LAB — COMPREHENSIVE METABOLIC PANEL
A/G RATIO: 1.3 (ref 1.2–2.2)
ALT: 26 IU/L (ref 0–32)
AST: 25 IU/L (ref 0–40)
Albumin: 3.9 g/dL (ref 3.6–4.8)
Alkaline Phosphatase: 84 IU/L (ref 39–117)
BILIRUBIN TOTAL: 0.4 mg/dL (ref 0.0–1.2)
BUN/Creatinine Ratio: 28 (ref 12–28)
BUN: 24 mg/dL (ref 8–27)
CALCIUM: 9.9 mg/dL (ref 8.7–10.3)
CHLORIDE: 102 mmol/L (ref 96–106)
CO2: 25 mmol/L (ref 18–29)
Creatinine, Ser: 0.87 mg/dL (ref 0.57–1.00)
GFR calc Af Amer: 79 mL/min/{1.73_m2} (ref >59)
GFR, EST NON AFRICAN AMERICAN: 68 mL/min/{1.73_m2} (ref >59)
GLUCOSE: 96 mg/dL (ref 65–99)
Globulin, Total: 2.9 g/dL (ref 1.5–4.5)
POTASSIUM: 4.9 mmol/L (ref 3.5–5.2)
Sodium: 142 mmol/L (ref 134–144)
Total Protein: 6.8 g/dL (ref 6.0–8.5)

## 2017-02-03 LAB — HEPATITIS C ANTIBODY

## 2017-02-03 LAB — TSH: TSH: 2.88 u[IU]/mL (ref 0.450–4.500)

## 2017-02-24 ENCOUNTER — Ambulatory Visit
Admission: RE | Admit: 2017-02-24 | Discharge: 2017-02-24 | Disposition: A | Discharge status: Home / Self Care | Payer: Medicare HMO | Source: Ambulatory Visit | Attending: General Surgery | Admitting: General Surgery

## 2017-02-24 ENCOUNTER — Ambulatory Visit
Admission: RE | Admit: 2017-02-24 | Discharge: 2017-02-24 | Disposition: A | Discharge status: Home / Self Care | Payer: Medicare HMO | Source: Ambulatory Visit | Attending: Family Medicine | Admitting: Family Medicine

## 2017-02-24 DIAGNOSIS — Z13820 Encounter for screening for osteoporosis: Secondary | ICD-10-CM | POA: Insufficient documentation

## 2017-02-24 DIAGNOSIS — Z1231 Encounter for screening mammogram for malignant neoplasm of breast: Secondary | ICD-10-CM | POA: Diagnosis not present

## 2017-02-24 DIAGNOSIS — M85852 Other specified disorders of bone density and structure, left thigh: Secondary | ICD-10-CM | POA: Diagnosis not present

## 2017-02-25 ENCOUNTER — Encounter: Payer: Self-pay | Admitting: Family Medicine

## 2017-02-25 DIAGNOSIS — M8580 Other specified disorders of bone density and structure, unspecified site: Secondary | ICD-10-CM | POA: Insufficient documentation

## 2017-02-25 DIAGNOSIS — M810 Age-related osteoporosis without current pathological fracture: Secondary | ICD-10-CM | POA: Insufficient documentation

## 2017-03-02 ENCOUNTER — Encounter: Payer: Self-pay | Admitting: General Surgery

## 2017-03-02 ENCOUNTER — Ambulatory Visit (INDEPENDENT_AMBULATORY_CARE_PROVIDER_SITE_OTHER): Payer: Medicare HMO | Admitting: General Surgery

## 2017-03-02 VITALS — BP 142/70 | HR 74 | Resp 14 | Ht 64.0 in | Wt 257.0 lb

## 2017-03-02 DIAGNOSIS — N6019 Diffuse cystic mastopathy of unspecified breast: Secondary | ICD-10-CM | POA: Diagnosis not present

## 2017-03-02 NOTE — Patient Instructions (Signed)
  Patient to follow up with her PCP for breast check and mammograms yearly.

## 2017-03-02 NOTE — Progress Notes (Signed)
Patient ID: Felicia Vargas, female   DOB: 1946/05/25, 71 y.o.   MRN: [id number]  Chief Complaint  Patient presents with  . Follow-up    HPI Felicia Vargas is a 71 y.o. female who presents for a breast evaluation. The most recent mammogram was done on 02/24/2017. Bone density test done on 02/24/2017. No new breast problems. Patient does perform regular self breast checks and gets regular mammograms done.   Screening colonoscopy done last year with no polyps only diverticulosis found. HPI  Past Medical History:  Diagnosis Date  . Allergy   . Arthritis   . Cancer ([place])    skin  . Diffuse cystic mastopathy 2013  . High cholesterol   . Hypertension 1990's  . Migraine   . Migraines   . Overactive bladder   . Urinary incontinence     Past Surgical History:  Procedure Laterality Date  . BREAST BIOPSY Right 05/31/93   neg/lump  . COLONOSCOPY  11/2004   Dr. Chauncey Reading  . COLONOSCOPY WITH PROPOFOL N/A 10/15/2016   Procedure: COLONOSCOPY WITH PROPOFOL;  Surgeon: Christene Lye, MD;  Location: [facility] ENDOSCOPY;  Service: Endoscopy;  Laterality: N/A;  . KIDNEY SURGERY  1998  . LITHOTRIPSY  1998    Family History  Problem Relation Age of Onset  . Lung cancer Father   . Stroke Mother   . Hypertension Sister   . Arthritis Sister   . Breast cancer Neg Hx     Social History Social History  Substance Use Topics  . Smoking status: Never Smoker  . Smokeless tobacco: Never Used  . Alcohol use No    No Known Allergies  Current Outpatient Prescriptions  Medication Sig Dispense Refill  . aspirin 81 MG tablet Take 81 mg by mouth daily.    . calcium carbonate (OS-CAL) 600 MG TABS Take 600 mg by mouth 2 (two) times daily with a meal.    . Cholecalciferol (VITAMIN D PO) Take 1 capsule by mouth daily.    . cyclobenzaprine (FLEXERIL) 10 MG tablet TAKE 1 TABLET BY MOUTH TWICE A DAY AS NEEDED SPASM 90 tablet 0  . docusate sodium (COLACE) 100 MG capsule Take 100 mg by mouth 2 (two) times  daily.    . fluticasone (FLONASE) 50 MCG/ACT nasal spray PLACE 1 SPRAY INTO BOTH NOSTRILS DAILY. 16 g 12  . lisinopril-hydrochlorothiazide (PRINZIDE,ZESTORETIC) 20-25 MG tablet Take 1 tablet by mouth daily. 90 tablet 3  . loratadine (CLARITIN) 10 MG tablet Take 10 mg by mouth daily.    . Multiple Vitamin (MULTIVITAMIN) tablet Take 1 tablet by mouth daily.    . naproxen sodium (ANAPROX) 220 MG tablet Take 220 mg by mouth every morning.    Marland Kitchen oxybutynin (DITROPAN) 5 MG tablet TAKE 1 TABLET (5 MG) BY ORAL ROUTE 2 TIMES PER DAY 180 tablet 3  . pravastatin (PRAVACHOL) 20 MG tablet Take 1 tablet (20 mg total) by mouth at bedtime. 90 tablet 3  . TURMERIC PO Take by mouth daily.     No current facility-administered medications for this visit.     Review of Systems Review of Systems  Constitutional: Negative.   Respiratory: Negative.   Cardiovascular: Negative.     Blood pressure (!) 142/70, pulse 74, resp. rate 14, height 5\' 4"  (1.626 m), weight 257 lb (116.6 kg).  Physical Exam Physical Exam  Constitutional: She appears well-developed and well-nourished.  Eyes: Conjunctivae are normal. No scleral icterus.  Neck: Neck supple.  Cardiovascular: Normal rate, regular rhythm  and normal heart sounds.   Pulmonary/Chest: Effort normal and breath sounds normal. Right breast exhibits no inverted nipple, no mass, no nipple discharge, no skin change and no tenderness. Left breast exhibits no inverted nipple, no mass, no nipple discharge, no skin change and no tenderness.  Abdominal: Soft. Bowel sounds are normal. There is no tenderness.  Lymphadenopathy:    She has no cervical adenopathy.    She has no axillary adenopathy.  Neurological: She is alert.  Skin: Skin is warm and dry.    Data Reviewed Mammogram reviewed and stable.  Colonoscopy from 10/15/2016 reviewed - no polyps found, only diverticulosis.   Assessment    Stable exam. History of fibrocystic disease.     Plan    Patient to  follow up with her PCP for breast check and mammograms yearly.   HPI, Physical Exam, Assessment and Plan have been scribed under the direction and in the presence of Mckinley Jewel, MD  Gaspar Cola, CMA      I have completed the exam and reviewed the above documentation for accuracy and completeness.  I agree with the above.  Haematologist has been used and any errors in dictation or transcription are unintentional.  Seeplaputhur G. Jamal Collin, M.D., F.A.C.S.  Junie Panning G 03/02/2017, 3:02 PM

## 2017-03-06 ENCOUNTER — Ambulatory Visit (INDEPENDENT_AMBULATORY_CARE_PROVIDER_SITE_OTHER): Payer: Medicare HMO | Admitting: Family Medicine

## 2017-03-06 ENCOUNTER — Encounter: Payer: Self-pay | Admitting: Family Medicine

## 2017-03-06 VITALS — BP 114/69 | HR 89 | Temp 98.6°F | Wt 256.2 lb

## 2017-03-06 DIAGNOSIS — R21 Rash and other nonspecific skin eruption: Secondary | ICD-10-CM | POA: Diagnosis not present

## 2017-03-06 MED ORDER — VALACYCLOVIR HCL 1 G PO TABS: 1000.0000 mg | ORAL_TABLET | Freq: Two times a day (BID) | ORAL | 1 refills | Status: DC

## 2017-03-06 MED ORDER — TRIAMCINOLONE ACETONIDE 0.5 % EX OINT: 1.0000 "application " | TOPICAL_OINTMENT | Freq: Two times a day (BID) | CUTANEOUS | 0 refills | Status: DC

## 2017-03-06 NOTE — Patient Instructions (Addendum)
Contact Dermatitis Dermatitis is redness, soreness, and swelling (inflammation) of the skin. Contact dermatitis is a reaction to certain substances that touch the skin. There are two types of contact dermatitis:  Irritant contact dermatitis. This type is caused by something that irritates your skin, such as dry hands from washing them too much. This type does not require previous exposure to the substance for a reaction to occur. This type is more common.  Allergic contact dermatitis. This type is caused by a substance that you are allergic to, such as a nickel allergy or poison ivy. This type only occurs if you have been exposed to the substance (allergen) before. Upon a repeat exposure, your body reacts to the substance. This type is less common. What are the causes? Many different substances can cause contact dermatitis. Irritant contact dermatitis is most commonly caused by exposure to:  Makeup.  Soaps.  Detergents.  Bleaches.  Acids.  Metal salts, such as nickel. Allergic contact dermatitis is most commonly caused by exposure to:  Poisonous plants.  Chemicals.  Jewelry.  Latex.  Medicines.  Preservatives in products, such as clothing. What increases the risk? This condition is more likely to develop in:  People who have jobs that expose them to irritants or allergens.  People who have certain medical conditions, such as asthma or eczema. What are the signs or symptoms? Symptoms of this condition may occur anywhere on your body where the irritant has touched you or is touched by you. Symptoms include:  Dryness or flaking.  Redness.  Cracks.  Itching.  Pain or a burning feeling.  Blisters.  Drainage of small amounts of blood or clear fluid from skin cracks. With allergic contact dermatitis, there may also be swelling in areas such as the eyelids, mouth, or genitals. How is this diagnosed? This condition is diagnosed with a medical history and physical exam.  A patch skin test may be performed to help determine the cause. If the condition is related to your job, you may need to see an occupational medicine specialist. How is this treated? Treatment for this condition includes figuring out what caused the reaction and protecting your skin from further contact. Treatment may also include:  Steroid creams or ointments. Oral steroid medicines may be needed in more severe cases.  Antibiotics or antibacterial ointments, if a skin infection is present.  Antihistamine lotion or an antihistamine taken by mouth to ease itching.  A bandage (dressing). Follow these instructions at home: [place] your skin as needed.  Apply cool compresses to the affected areas.  Try taking a bath with:  Epsom salts. Follow the instructions on the packaging. You can get these at your local pharmacy or grocery store.  Baking soda. Pour a small amount into the bath as directed by your health care provider.  Colloidal oatmeal. Follow the instructions on the packaging. You can get this at your local pharmacy or grocery store.  Try applying baking soda paste to your skin. Stir water into baking soda until it reaches a paste-like consistency.  Do not scratch your skin.  Bathe less frequently, such as every other day.  Bathe in lukewarm water. Avoid using hot water. Medicines   Take or apply over-the-counter and prescription medicines only as told by your health care provider.  If you were prescribed an antibiotic medicine, take or apply your antibiotic as told by your health care provider. Do not stop using the antibiotic even if your condition starts to improve. General  instructions   Keep all follow-up visits as told by your health care provider. This is important.  Avoid the substance that caused your reaction. If you do not know what caused it, keep a journal to try to track what caused it. Write down:  What you eat.  What cosmetic products  you use.  What you drink.  What you wear in the affected area. This includes jewelry.  If you were given a dressing, take care of it as told by your health care provider. This includes when to change and remove it. Contact a health care provider if:  Your condition does not improve with treatment.  Your condition gets worse.  You have signs of infection such as swelling, tenderness, redness, soreness, or warmth in the affected area.  You have a fever.  You have new symptoms. Get help right away if:  You have a severe headache, neck pain, or neck stiffness.  You vomit.  You feel very sleepy.  You notice red streaks coming from the affected area.  Your bone or joint underneath the affected area becomes painful after the skin has healed.  The affected area turns darker.  You have difficulty breathing. This information is not intended to replace advice given to you by your health care provider. Make sure you discuss any questions you have with your health care provider. Document Released: 10/31/2000 Document Revised: 04/10/2016 Document Reviewed: 03/21/2015 Elsevier Interactive Patient Education  2017 [place], which is also known as herpes zoster, is an infection that causes a painful skin rash and fluid-filled blisters. Shingles is not related to genital herpes, which is a sexually transmitted infection. Shingles only develops in people who:  Have had chickenpox.  Have received the chickenpox vaccine. (This is rare.) What are the causes? Shingles is caused by varicella-zoster virus (VZV). This is the same virus that causes chickenpox. After exposure to VZV, the virus stays in the body in an inactive (dormant) state. Shingles develops if the virus reactivates. This can happen many years after the initial exposure to VZV. It is not known what causes this virus to reactivate. What increases the risk? People who have had chickenpox or received the  chickenpox vaccine are at risk for shingles. Infection is more common in people who:  Are older than age 19.  Have a weakened defense (immune) system, such as those with HIV, AIDS, or cancer.  Are taking medicines that weaken the immune system, such as transplant medicines.  Are under great stress. What are the signs or symptoms? Early symptoms of this condition include itching, tingling, and pain in an area on your skin. Pain may be described as burning, stabbing, or throbbing. A few days or weeks after symptoms start, a painful red rash appears, usually on one side of the body in a bandlike or beltlike pattern. The rash eventually turns into fluid-filled blisters that break open, scab over, and dry up in about 2-3 weeks. At any time during the infection, you may also develop:  A fever.  Chills.  A headache.  An upset stomach. How is this diagnosed? This condition is diagnosed with a skin exam. Sometimes, skin or fluid samples are taken from the blisters before a diagnosis is made. These samples are examined under a microscope or sent to a lab for testing. How is this treated? There is no specific cure for this condition. Your health care provider will probably prescribe medicines to help you manage pain, recover more quickly, and  avoid long-term problems. Medicines may include:  Antiviral drugs.  Anti-inflammatory drugs.  Pain medicines. If the area involved is on your face, you may be referred to a specialist, such as an eye doctor (ophthalmologist) or an ear, nose, and throat (ENT) doctor to help you avoid eye problems, chronic pain, or disability. Follow these instructions at home: Medicines   Take medicines only as directed by your health care provider.  Apply an anti-itch or numbing cream to the affected area as directed by your health care provider. Blister and Rash Care   Take a cool bath or apply cool compresses to the area of the rash or blisters as directed by your  health care provider. This may help with pain and itching.  Keep your rash covered with a loose bandage (dressing). Wear loose-fitting clothing to help ease the pain of material rubbing against the rash.  Keep your rash and blisters clean with mild soap and cool water or as directed by your health care provider.  Check your rash every day for signs of infection. These include redness, swelling, and pain that lasts or increases.  Do not pick your blisters.  Do not scratch your rash. General instructions   Rest as directed by your health care provider.  Keep all follow-up visits as directed by your health care provider. This is important.  Until your blisters scab over, your infection can cause chickenpox in people who have never had it or been vaccinated against it. To prevent this from happening, avoid contact with other people, especially:  Babies.  Pregnant women.  Children who have eczema.  Elderly people who have transplants.  People who have chronic illnesses, such as leukemia or AIDS. Contact a health care provider if:  Your pain is not relieved with prescribed medicines.  Your pain does not get better after the rash heals.  Your rash looks infected. Signs of infection include redness, swelling, and pain that lasts or increases. Get help right away if:  The rash is on your face or nose.  You have facial pain, pain around your eye area, or loss of feeling on one side of your face.  You have ear pain or you have ringing in your ear.  You have loss of taste.  Your condition gets worse. This information is not intended to replace advice given to you by your health care provider. Make sure you discuss any questions you have with your health care provider. Document Released: 11/03/2005 Document Revised: 06/29/2016 Document Reviewed: 09/14/2014 Elsevier Interactive Patient Education  2017 [employer].

## 2017-03-06 NOTE — Progress Notes (Signed)
BP 114/69 (BP Location: Left Arm, Patient Position: Sitting, Cuff Size: Large)   Pulse 89   Temp 98.6 F (37 C)   Wt 256 lb 3.2 oz (116.2 kg)   SpO2 99%   BMI 43.98 kg/m    Subjective:    Patient ID: Felicia Vargas, female    DOB: 1946-08-13, 71 y.o.   MRN: [id number]  HPI: Felicia Vargas is a 71 y.o. female  Chief Complaint  Patient presents with  . Rash    right lower leg, started Monday, unsure if it is Shingles   RASH- thinks that she has shingles. Started on Monday, Duration:  days  Location: R leg  Itching: yes Burning: yes Redness: yes Oozing: no Scaling: no Blisters: yes Painful: yes Fevers: no Change in detergents/soaps/personal care products: no Recent illness: no Recent travel:no History of same: yes Context: worse Alleviating factors: nothing Treatments attempted:nothing Shortness of breath: no  Throat/tongue swelling: no Myalgias/arthralgias: no  Relevant past medical, surgical, family and social history reviewed and updated as indicated. Interim medical history since our last visit reviewed. Allergies and medications reviewed and updated.  Review of Systems  Constitutional: Negative.   Respiratory: Negative.   Cardiovascular: Negative.   Psychiatric/Behavioral: Negative.     Per HPI unless specifically indicated above     Objective:    BP 114/69 (BP Location: Left Arm, Patient Position: Sitting, Cuff Size: Large)   Pulse 89   Temp 98.6 F (37 C)   Wt 256 lb 3.2 oz (116.2 kg)   SpO2 99%   BMI 43.98 kg/m   Wt Readings from Last 3 Encounters:  03/06/17 256 lb 3.2 oz (116.2 kg)  03/02/17 257 lb (116.6 kg)  02/02/17 259 lb (117.5 kg)    Physical Exam  Constitutional: She is oriented to person, place, and time. She appears well-developed and well-nourished. No distress.  HENT:  Head: Normocephalic and atraumatic.  Right Ear: Hearing normal.  Left Ear: Hearing normal.  Nose: Nose normal.  Eyes: Conjunctivae and lids are normal.  Right eye exhibits no discharge. Left eye exhibits no discharge. No scleral icterus.  Cardiovascular: Normal rate, regular rhythm, normal heart sounds and intact distal pulses.  Exam reveals no gallop and no friction rub.   No murmur heard. Pulmonary/Chest: Effort normal and breath sounds normal. No respiratory distress. She has no wheezes. She has no rales. She exhibits no tenderness.  Musculoskeletal: Normal range of motion.  Neurological: She is alert and oriented to person, place, and time.  Skin: Skin is warm, dry and intact. No rash noted. No erythema. No pallor.     Psychiatric: She has a normal mood and affect. Her speech is normal and behavior is normal. Judgment and thought content normal. Cognition and memory are normal.  Nursing note and vitals reviewed.   Results for orders placed or performed in visit on 02/02/17  Microscopic Examination  Result Value Ref Range   WBC, UA None seen 0 - 5 /hpf   RBC, UA None seen 0 - 2 /hpf   Epithelial Cells (non renal) CANCELED    Bacteria, UA None seen None seen/Few  CBC with Differential/Platelet  Result Value Ref Range   WBC 5.1 3.4 - 10.8 x10E3/uL   RBC 3.66 (L) 3.77 - 5.28 x10E6/uL   Hemoglobin 11.8 11.1 - 15.9 g/dL   Hematocrit 34.9 34.0 - 46.6 %   MCV 95 79 - 97 fL   MCH 32.2 26.6 - 33.0 pg   MCHC 33.8  31.5 - 35.7 g/dL   RDW 13.1 12.3 - 15.4 %   Platelets 265 150 - 379 x10E3/uL   Neutrophils 47 Not Estab. %   Lymphs 37 Not Estab. %   Monocytes 10 Not Estab. %   Eos 5 Not Estab. %   Basos 1 Not Estab. %   Neutrophils Absolute 2.5 1.4 - 7.0 x10E3/uL   Lymphocytes Absolute 1.9 0.7 - 3.1 x10E3/uL   Monocytes Absolute 0.5 0.1 - 0.9 x10E3/uL   EOS (ABSOLUTE) 0.2 0.0 - 0.4 x10E3/uL   Basophils Absolute 0.1 0.0 - 0.2 x10E3/uL   Immature Granulocytes 0 Not Estab. %   Immature Grans (Abs) 0.0 0.0 - 0.1 x10E3/uL  Lipid panel  Result Value Ref Range   Cholesterol, Total 154 100 - 199 mg/dL   Triglycerides 148 0 - 149 mg/dL    HDL 39 (L) >39 mg/dL   VLDL Cholesterol Cal 30 5 - 40 mg/dL   LDL Calculated 85 0 - 99 mg/dL   Chol/HDL Ratio 3.9 0.0 - 4.4 ratio units  Comprehensive metabolic panel  Result Value Ref Range   Glucose 96 65 - 99 mg/dL   BUN 24 8 - 27 mg/dL   Creatinine, Ser 0.87 0.57 - 1.00 mg/dL   GFR calc non Af Amer 68 >59 mL/min/1.73   GFR calc Af Amer 79 >59 mL/min/1.73   BUN/Creatinine Ratio 28 12 - 28   Sodium 142 134 - 144 mmol/L   Potassium 4.9 3.5 - 5.2 mmol/L   Chloride 102 96 - 106 mmol/L   CO2 25 18 - 29 mmol/L   Calcium 9.9 8.7 - 10.3 mg/dL   Total Protein 6.8 6.0 - 8.5 g/dL   Albumin 3.9 3.6 - 4.8 g/dL   Globulin, Total 2.9 1.5 - 4.5 g/dL   Albumin/Globulin Ratio 1.3 1.2 - 2.2   Bilirubin Total 0.4 0.0 - 1.2 mg/dL   Alkaline Phosphatase 84 39 - 117 IU/L   AST 25 0 - 40 IU/L   ALT 26 0 - 32 IU/L  TSH  Result Value Ref Range   TSH 2.880 0.450 - 4.500 uIU/mL  UA/M w/rflx Culture, Routine  Result Value Ref Range   Specific Gravity, UA 1.015 1.005 - 1.030   pH, UA 7.0 5.0 - 7.5   Color, UA Yellow Yellow   Appearance Ur Clear Clear   Leukocytes, UA Negative Negative   Protein, UA Negative Negative/Trace   Glucose, UA Negative Negative   Ketones, UA Negative Negative   RBC, UA Negative Negative   Bilirubin, UA Negative Negative   Urobilinogen, Ur 0.2 0.2 - 1.0 mg/dL   Nitrite, UA Negative Negative   Microscopic Examination See below:   Hepatitis C Antibody  Result Value Ref Range   Hep C Virus Ab <0.1 0.0 - 0.9 s/co ratio  Microalbumin, Urine Waived  Result Value Ref Range   Microalb, Ur Waived 10 0 - 19 mg/L   Creatinine, Urine Waived 50 10 - 300 mg/dL   Microalb/Creat Ratio <30 <30 mg/g      Assessment & Plan:   Problem List Items Addressed This Visit    None    Visit Diagnoses    Rash    -  Primary   Unclear if this is shingles as it is not dermatomal. Will treat with both valacyclovir and triamcinalone ointment in case it's contact derm. Recheck Monday.        Follow up plan: Return Monday, for Recheck rash.

## 2017-03-09 ENCOUNTER — Encounter: Payer: Self-pay | Admitting: Family Medicine

## 2017-03-09 ENCOUNTER — Ambulatory Visit (INDEPENDENT_AMBULATORY_CARE_PROVIDER_SITE_OTHER): Payer: Medicare HMO | Admitting: Family Medicine

## 2017-03-09 VITALS — BP 131/67 | HR 74 | Temp 98.3°F | Wt 255.5 lb

## 2017-03-09 DIAGNOSIS — R21 Rash and other nonspecific skin eruption: Secondary | ICD-10-CM

## 2017-03-09 DIAGNOSIS — R600 Localized edema: Secondary | ICD-10-CM | POA: Diagnosis not present

## 2017-03-09 MED ORDER — TRIAMCINOLONE ACETONIDE 0.5 % EX OINT: 1.0000 "application " | TOPICAL_OINTMENT | Freq: Two times a day (BID) | CUTANEOUS | 6 refills | Status: DC

## 2017-03-09 NOTE — Progress Notes (Signed)
BP 131/67 (BP Location: Left Arm, Patient Position: Sitting, Cuff Size: Large)   Pulse 74   Temp 98.3 F (36.8 C)   Wt 255 lb 8 oz (115.9 kg)   SpO2 98%   BMI 43.86 kg/m    Subjective:    Patient ID: Felicia Vargas, female    DOB: 12-18-1945, 71 y.o.   MRN: [id number]  HPI: Felicia Vargas is a 71 y.o. female  Chief Complaint  Patient presents with  . Rash    Patient states that she has ben drinking grapefruit juice, she is unsure if that may have caused a reaction with her medications.    RASH- significantly better. Not sure if it's the cream or the medicine that's made it better Duration:   Couple of days Location: R leg  Itching: yes Burning: yes Redness: yes Oozing: no Scaling: no Blisters: yes Painful: yes Fevers: no Change in detergents/soaps/personal care products: no Recent illness: no Recent travel:no History of same: yes Context: worse Alleviating factors: nothing Treatments attempted:nothing Shortness of breath: no  Throat/tongue swelling: no Myalgias/arthralgias: no  Swelling of R leg only- started Friday, no redness, no heat, very tender. Has not been wearing compression stockings. No fevers. No other concerns  Relevant past medical, surgical, family and social history reviewed and updated as indicated. Interim medical history since our last visit reviewed. Allergies and medications reviewed and updated.  Review of Systems  Constitutional: Negative.   Respiratory: Negative.   Cardiovascular: Positive for leg swelling. Negative for chest pain and palpitations.  Skin: Positive for rash. Negative for color change, pallor and wound.  Psychiatric/Behavioral: Negative.     Per HPI unless specifically indicated above     Objective:    BP 131/67 (BP Location: Left Arm, Patient Position: Sitting, Cuff Size: Large)   Pulse 74   Temp 98.3 F (36.8 C)   Wt 255 lb 8 oz (115.9 kg)   SpO2 98%   BMI 43.86 kg/m   Wt Readings from Last 3 Encounters:    03/09/17 255 lb 8 oz (115.9 kg)  03/06/17 256 lb 3.2 oz (116.2 kg)  03/02/17 257 lb (116.6 kg)    Physical Exam  Constitutional: She is oriented to person, place, and time. She appears well-developed and well-nourished. No distress.  HENT:  Head: Normocephalic and atraumatic.  Right Ear: Hearing normal.  Left Ear: Hearing normal.  Nose: Nose normal.  Eyes: Conjunctivae and lids are normal. Right eye exhibits no discharge. Left eye exhibits no discharge. No scleral icterus.  Cardiovascular: Normal rate, regular rhythm, normal heart sounds and intact distal pulses.  Exam reveals no gallop and no friction rub.   No murmur heard. Pulmonary/Chest: Effort normal and breath sounds normal. No respiratory distress. She has no wheezes. She has no rales. She exhibits no tenderness.  Musculoskeletal: Normal range of motion. She exhibits edema (2+ edema R leg only). She exhibits no tenderness or deformity.  Neurological: She is alert and oriented to person, place, and time.  Skin: Skin is warm, dry and intact. Rash (significantly improved, now with just some erythematous patches) noted. No erythema. No pallor.  Psychiatric: She has a normal mood and affect. Her speech is normal and behavior is normal. Judgment and thought content normal. Cognition and memory are normal.  Nursing note and vitals reviewed.   Results for orders placed or performed in visit on 02/02/17  Microscopic Examination  Result Value Ref Range   WBC, UA None seen 0 - 5 /hpf  RBC, UA None seen 0 - 2 /hpf   Epithelial Cells (non renal) CANCELED    Bacteria, UA None seen None seen/Few  CBC with Differential/Platelet  Result Value Ref Range   WBC 5.1 3.4 - 10.8 x10E3/uL   RBC 3.66 (L) 3.77 - 5.28 x10E6/uL   Hemoglobin 11.8 11.1 - 15.9 g/dL   Hematocrit 34.9 34.0 - 46.6 %   MCV 95 79 - 97 fL   MCH 32.2 26.6 - 33.0 pg   MCHC 33.8 31.5 - 35.7 g/dL   RDW 13.1 12.3 - 15.4 %   Platelets 265 150 - 379 x10E3/uL   Neutrophils 47  Not Estab. %   Lymphs 37 Not Estab. %   Monocytes 10 Not Estab. %   Eos 5 Not Estab. %   Basos 1 Not Estab. %   Neutrophils Absolute 2.5 1.4 - 7.0 x10E3/uL   Lymphocytes Absolute 1.9 0.7 - 3.1 x10E3/uL   Monocytes Absolute 0.5 0.1 - 0.9 x10E3/uL   EOS (ABSOLUTE) 0.2 0.0 - 0.4 x10E3/uL   Basophils Absolute 0.1 0.0 - 0.2 x10E3/uL   Immature Granulocytes 0 Not Estab. %   Immature Grans (Abs) 0.0 0.0 - 0.1 x10E3/uL  Lipid panel  Result Value Ref Range   Cholesterol, Total 154 100 - 199 mg/dL   Triglycerides 148 0 - 149 mg/dL   HDL 39 (L) >39 mg/dL   VLDL Cholesterol Cal 30 5 - 40 mg/dL   LDL Calculated 85 0 - 99 mg/dL   Chol/HDL Ratio 3.9 0.0 - 4.4 ratio units  Comprehensive metabolic panel  Result Value Ref Range   Glucose 96 65 - 99 mg/dL   BUN 24 8 - 27 mg/dL   Creatinine, Ser 0.87 0.57 - 1.00 mg/dL   GFR calc non Af Amer 68 >59 mL/min/1.73   GFR calc Af Amer 79 >59 mL/min/1.73   BUN/Creatinine Ratio 28 12 - 28   Sodium 142 134 - 144 mmol/L   Potassium 4.9 3.5 - 5.2 mmol/L   Chloride 102 96 - 106 mmol/L   CO2 25 18 - 29 mmol/L   Calcium 9.9 8.7 - 10.3 mg/dL   Total Protein 6.8 6.0 - 8.5 g/dL   Albumin 3.9 3.6 - 4.8 g/dL   Globulin, Total 2.9 1.5 - 4.5 g/dL   Albumin/Globulin Ratio 1.3 1.2 - 2.2   Bilirubin Total 0.4 0.0 - 1.2 mg/dL   Alkaline Phosphatase 84 39 - 117 IU/L   AST 25 0 - 40 IU/L   ALT 26 0 - 32 IU/L  TSH  Result Value Ref Range   TSH 2.880 0.450 - 4.500 uIU/mL  UA/M w/rflx Culture, Routine  Result Value Ref Range   Specific Gravity, UA 1.015 1.005 - 1.030   pH, UA 7.0 5.0 - 7.5   Color, UA Yellow Yellow   Appearance Ur Clear Clear   Leukocytes, UA Negative Negative   Protein, UA Negative Negative/Trace   Glucose, UA Negative Negative   Ketones, UA Negative Negative   RBC, UA Negative Negative   Bilirubin, UA Negative Negative   Urobilinogen, Ur 0.2 0.2 - 1.0 mg/dL   Nitrite, UA Negative Negative   Microscopic Examination See below:   Hepatitis C  Antibody  Result Value Ref Range   Hep C Virus Ab <0.1 0.0 - 0.9 s/co ratio  Microalbumin, Urine Waived  Result Value Ref Range   Microalb, Ur Waived 10 0 - 19 mg/L   Creatinine, Urine Waived 50 10 - 300 mg/dL  Microalb/Creat Ratio <30 <30 mg/g      Assessment & Plan:   Problem List Items Addressed This Visit    None    Visit Diagnoses    Rash    -  Primary   Significantly better. Finish valacyclovir and triamcinalone- will refill   Leg edema, right       Will set up [place] to R/O DVT, Likely just needs compression stockings as that it the leg she had surgery on. Call with any concerns.    Relevant Orders   US Venous Img Lower Unilateral Left       Follow up plan: Return Pending results.

## 2017-03-10 ENCOUNTER — Emergency Department: Payer: Medicare HMO

## 2017-03-10 ENCOUNTER — Encounter: Payer: Self-pay | Admitting: Medical Oncology

## 2017-03-10 ENCOUNTER — Telehealth: Payer: Self-pay | Admitting: Family Medicine

## 2017-03-10 ENCOUNTER — Emergency Department
Admission: EM | Admit: 2017-03-10 | Discharge: 2017-03-10 | Disposition: A | Discharge status: Home / Self Care | Payer: Medicare HMO | Attending: Emergency Medicine | Admitting: Emergency Medicine

## 2017-03-10 DIAGNOSIS — M79661 Pain in right lower leg: Secondary | ICD-10-CM

## 2017-03-10 DIAGNOSIS — M7989 Other specified soft tissue disorders: Secondary | ICD-10-CM

## 2017-03-10 DIAGNOSIS — L03115 Cellulitis of right lower limb: Secondary | ICD-10-CM | POA: Diagnosis not present

## 2017-03-10 DIAGNOSIS — Z79899 Other long term (current) drug therapy: Secondary | ICD-10-CM | POA: Diagnosis not present

## 2017-03-10 DIAGNOSIS — Z85828 Personal history of other malignant neoplasm of skin: Secondary | ICD-10-CM | POA: Diagnosis not present

## 2017-03-10 DIAGNOSIS — I10 Essential (primary) hypertension: Secondary | ICD-10-CM | POA: Diagnosis not present

## 2017-03-10 DIAGNOSIS — Z7982 Long term (current) use of aspirin: Secondary | ICD-10-CM | POA: Diagnosis not present

## 2017-03-10 LAB — CBC WITH DIFFERENTIAL/PLATELET
BASOS PCT: 2 %
Basophils Absolute: 0.1 10*3/uL (ref 0–0.1)
Eosinophils Absolute: 0.4 10*3/uL (ref 0–0.7)
Eosinophils Relative: 5 %
HEMATOCRIT: 33.7 % — AB (ref 35.0–47.0)
HEMOGLOBIN: 11.9 g/dL — AB (ref 12.0–16.0)
Lymphocytes Relative: 30 %
Lymphs Abs: 2 10*3/uL (ref 1.0–3.6)
MCH: 33.3 pg (ref 26.0–34.0)
MCHC: 35.2 g/dL (ref 32.0–36.0)
MCV: 94.7 fL (ref 80.0–100.0)
MONOS PCT: 8 %
Monocytes Absolute: 0.6 10*3/uL (ref 0.2–0.9)
NEUTROS ABS: 3.7 10*3/uL (ref 1.4–6.5)
Neutrophils Relative %: 55 %
PLATELETS: 277 10*3/uL (ref 150–440)
RBC: 3.56 MIL/uL — AB (ref 3.80–5.20)
RDW: 12.6 % (ref 11.5–14.5)
WBC: 6.8 10*3/uL (ref 3.6–11.0)

## 2017-03-10 LAB — BASIC METABOLIC PANEL
ANION GAP: 6 (ref 5–15)
BUN: 20 mg/dL (ref 6–20)
CALCIUM: 9.6 mg/dL (ref 8.9–10.3)
CHLORIDE: 107 mmol/L (ref 101–111)
CO2: 26 mmol/L (ref 22–32)
Creatinine, Ser: 0.77 mg/dL (ref 0.44–1.00)
GFR calc non Af Amer: >60 mL/min (ref >60)
Glucose, Bld: 86 mg/dL (ref 65–99)
Potassium: 3.9 mmol/L (ref 3.5–5.1)
Sodium: 139 mmol/L (ref 135–145)

## 2017-03-10 MED ORDER — CLINDAMYCIN HCL 150 MG PO CAPS
300.0000 mg | ORAL_CAPSULE | Freq: Once | ORAL | Status: AC
  Administered 2017-03-10: 300 mg via ORAL
  Filled 2017-03-10: qty 2

## 2017-03-10 MED ORDER — CLINDAMYCIN HCL 300 MG PO CAPS: 300.0000 mg | ORAL_CAPSULE | Freq: Three times a day (TID) | ORAL | 0 refills | Status: DC

## 2017-03-10 NOTE — ED Triage Notes (Signed)
Pt reports rash, swelling and pain to rt lower leg for 1 week, has seen pcp and been placed on abx and antivirals, area has improved some but not completely. Pt has good pulse, no pain with flex of foot.

## 2017-03-10 NOTE — ED Notes (Signed)
Patient transported to Ultrasound 

## 2017-03-10 NOTE — Telephone Encounter (Signed)
Called and discussed cellulitis with patient. She was started on clindamycin at the ER. Discussed with patient. No blood clot. She needs to come back in about a week- please get her scheduled for appointment.

## 2017-03-10 NOTE — Discharge Instructions (Signed)
You were evaluated for right leg swelling and redness and are being treated for skin infection called cellulitis with antibiotic clindamycin.  Return to the emergency department immediately for any worsening condition including fever, worsening skin rash or redness, or any other symptoms concerning to you.

## 2017-03-10 NOTE — ED Provider Notes (Signed)
[facility] Emergency Department Provider Note ____________________________________________   I have reviewed the triage vital signs and the triage nursing note.  HISTORY  Chief Complaint Leg Pain   Historian Patient  HPI Felicia Vargas is a 71 y.o. female presents for evaluation of right lower extremity swelling and redness. Symptoms of been there for over a week. Initially she saw her primary care doctor for a couple of small red lesions on her right lower extremity and she was started on antiviral for possible shingles. She has also been on Lasix for lower extremity edema.  Pain and swelling are slightly worse although the lesions are now gone, there is a pink discoloration to the right foot right calf and up to the right thigh. No calf stiffness, but there is tenderness all along the right lower extremity.  No fevers. No nausea or vomiting. No chest pain or trouble breathing.    Past Medical History:  Diagnosis Date  . Allergy   . Arthritis   . Cancer ([place])    skin  . Diffuse cystic mastopathy 2013  . High cholesterol   . Hypertension 1990's  . Migraine   . Migraines   . Overactive bladder   . Urinary incontinence     Patient Active Problem List   Diagnosis Date Noted  . Osteopenia 02/25/2017  . OAB (overactive bladder) 02/02/2017  . Rhinitis, allergic 07/16/2015  . Hypertension   . Diffuse cystic mastopathy   . Migraines   . High cholesterol     Past Surgical History:  Procedure Laterality Date  . BREAST BIOPSY Right 05/31/93   neg/lump  . COLONOSCOPY  11/2004   Dr. Chauncey Reading  . COLONOSCOPY WITH PROPOFOL N/A 10/15/2016   Procedure: COLONOSCOPY WITH PROPOFOL;  Surgeon: Christene Lye, MD;  Location: [facility] ENDOSCOPY;  Service: Endoscopy;  Laterality: N/A;  . KIDNEY SURGERY  1998  . LITHOTRIPSY  1998    Prior to Admission medications   Medication Sig Start Date End Date Taking? Authorizing Provider  aspirin 81 MG tablet Take 81  mg by mouth daily.   Yes Historical Provider, MD  calcium carbonate (OS-CAL) 600 MG TABS Take 600 mg by mouth daily with breakfast.    Yes Historical Provider, MD  Cholecalciferol (VITAMIN D PO) Take 1 capsule by mouth daily.   Yes Historical Provider, MD  cyclobenzaprine (FLEXERIL) 10 MG tablet TAKE 1 TABLET BY MOUTH TWICE A DAY AS NEEDED SPASM 03/06/16  Yes Megan P Johnson, DO  docusate sodium (COLACE) 100 MG capsule Take 100 mg by mouth daily.    Yes Historical Provider, MD  fluticasone (FLONASE) 50 MCG/ACT nasal spray PLACE 1 SPRAY INTO BOTH NOSTRILS DAILY. Patient taking differently: Place 1 spray into both nostrils 2 (two) times daily.  02/02/17  Yes Megan P Johnson, DO  lisinopril-hydrochlorothiazide (PRINZIDE,ZESTORETIC) 20-25 MG tablet Take 1 tablet by mouth daily. 02/02/17  Yes Megan P Johnson, DO  loratadine (CLARITIN) 10 MG tablet Take 10 mg by mouth daily.   Yes Historical Provider, MD  Multiple Vitamin (MULTIVITAMIN) tablet Take 1 tablet by mouth daily.   Yes Historical Provider, MD  naproxen sodium (ANAPROX) 220 MG tablet Take 220 mg by mouth every morning.   Yes Historical Provider, MD  oxybutynin (DITROPAN) 5 MG tablet TAKE 1 TABLET (5 MG) BY ORAL ROUTE 2 TIMES PER DAY 02/02/17  Yes Megan P Johnson, DO  pravastatin (PRAVACHOL) 20 MG tablet Take 1 tablet (20 mg total) by mouth at bedtime. 02/02/17  Yes  Megan P Johnson, DO  triamcinolone ointment (KENALOG) 0.5 % Apply 1 application topically 2 (two) times daily. 03/09/17  Yes Megan P Johnson, DO  TURMERIC PO Take 1 tablet by mouth 2 (two) times daily.    Yes Historical Provider, MD  valACYclovir (VALTREX) 1000 MG tablet Take 1 tablet (1,000 mg total) by mouth 2 (two) times daily. 03/06/17  Yes Megan P Johnson, DO  clindamycin (CLEOCIN) 300 MG capsule Take 1 capsule (300 mg total) by mouth 3 (three) times daily. 03/10/17   Lisa Roca, MD    No Known Allergies  Family History  Problem Relation Age of Onset  . Lung cancer Father   .  Stroke Mother   . Hypertension Sister   . Arthritis Sister   . Breast cancer Neg Hx     Social History Social History  Substance Use Topics  . Smoking status: Never Smoker  . Smokeless tobacco: Never Used  . Alcohol use No    Review of Systems  Constitutional: Negative for fever. Eyes: Negative for visual changes. ENT: Negative for sore throat. Cardiovascular: Negative for chest pain. Respiratory: Negative for shortness of breath. Gastrointestinal: Negative for abdominal pain, vomiting and diarrhea. Genitourinary: Negative for dysuria. Musculoskeletal: Negative for back pain. Skin: Negative for rash. Neurological: Negative for headache. 10 point Review of Systems otherwise negative ____________________________________________   PHYSICAL EXAM:  VITAL SIGNS: ED Triage Vitals  Enc Vitals Group     BP 03/10/17 0955 (!) 119/59     Pulse Rate 03/10/17 0955 84     Resp 03/10/17 0955 18     Temp 03/10/17 0955 98 F (36.7 C)     Temp Source 03/10/17 0955 Oral     SpO2 03/10/17 0955 97 %     Weight 03/10/17 0956 255 lb (115.7 kg)     Height 03/10/17 0956 5\' 4"  (1.626 m)     Head Circumference --      Peak Flow --      Pain Score 03/10/17 0953 6     Pain Loc --      Pain Edu? --      Excl. in [place]? --      Constitutional: Alert and oriented. Well appearing and in no distress. HEENT   Head: Normocephalic and atraumatic.      Eyes: Conjunctivae are normal. PERRL. Normal extraocular movements.      Ears:         Nose: No congestion/rhinnorhea.   Mouth/Throat: Mucous membranes are moist.   Neck: No stridor. Cardiovascular/Chest: Normal rate, regular rhythm.  No murmurs, rubs, or gallops. Respiratory: Normal respiratory effort without tachypnea nor retractions. Breath sounds are clear and equal bilaterally. No wheezes/rales/rhonchi. Gastrointestinal: Soft. No distention, no guarding, no rebound. Nontender.  Genitourinary/rectal:Deferred Musculoskeletal:  Nontender with normal range of motion in all extremities.  Right lower extremity with 2+ lower extremity pitting edema, 1+ pitting edema left lower extremity. Right lower extremity foot, calf, and thigh pain/erythema without any focal lesions or induration. Neurologic:  Normal speech and language. No gross or focal neurologic deficits are appreciated. Skin:  Skin is warm, dry and intact. No rash noted. Psychiatric: Mood and affect are normal. Speech and behavior are normal. Patient exhibits appropriate insight and judgment.   ____________________________________________  LABS (pertinent positives/negatives)  Labs Reviewed  CBC WITH DIFFERENTIAL/PLATELET - Abnormal; Notable for the following:       Result Value   RBC 3.56 (*)    Hemoglobin 11.9 (*)    HCT 33.7 (*)  All other components within normal limits  BASIC METABOLIC PANEL    ____________________________________________    EKG I, Lisa Roca, MD, the attending physician have personally viewed and interpreted all ECGs.  None ____________________________________________  RADIOLOGY All Xrays were viewed by me. Imaging interpreted by Radiologist.  Ultrasound right lower extremity: No evidence of deep venous thrombosis. __________________________________________  PROCEDURES  Procedure(s) performed: None  Critical Care performed: None  ____________________________________________   ED COURSE / ASSESSMENT AND PLAN  Pertinent labs & imaging results that were available during my care of the patient were reviewed by me and considered in my medical decision making (see chart for details).   Ms. Stivers Does have lower extremity edema as well as redness and soreness to the right lower extremity with comparison to the left. She has no respiratory symptoms cardiac symptoms. Does not seem to be suggestive of congestive heart failure.  DVT was ruled out by ultrasound of the left lower extremity.  Given the description  that the redness is somewhat worse, will go ahead and treat with clindamycin for cellulitis, coverage for mrsa as well.   CONSULTATIONS:  None   Patient / Family / Caregiver informed of clinical course, medical decision-making process, and agree with plan.   I discussed return precautions, follow-up instructions, and discharge instructions with patient and/or family.  Discharge instructions:  You were evaluated for right leg swelling and redness and are being treated for skin infection called cellulitis with antibiotic clindamycin.  Return to the emergency department immediately for any worsening condition including fever, worsening skin rash or redness, or any other symptoms concerning to you. ___________________________________________   FINAL CLINICAL IMPRESSION(S) / ED DIAGNOSES   Final diagnoses:  Cellulitis of right lower extremity              Note: This dictation was prepared with Dragon dictation. Any transcriptional errors that result from this process are unintentional    Lisa Roca, MD 03/10/17 1429

## 2017-03-10 NOTE — Telephone Encounter (Signed)
Patient would like for Dr Wynetta Emery to please call her before she leaves this today.  She was diagnosed with cellulitis.    [phone]  [phone]  Thank you

## 2017-03-10 NOTE — ED Notes (Signed)
Pt returned from US

## 2017-03-19 ENCOUNTER — Ambulatory Visit (INDEPENDENT_AMBULATORY_CARE_PROVIDER_SITE_OTHER): Payer: Medicare HMO | Admitting: Family Medicine

## 2017-03-19 ENCOUNTER — Encounter: Payer: Self-pay | Admitting: Family Medicine

## 2017-03-19 VITALS — BP 93/61 | HR 74 | Temp 98.3°F | Wt 257.4 lb

## 2017-03-19 DIAGNOSIS — M79651 Pain in right thigh: Secondary | ICD-10-CM | POA: Diagnosis not present

## 2017-03-19 DIAGNOSIS — R600 Localized edema: Secondary | ICD-10-CM | POA: Diagnosis not present

## 2017-03-19 DIAGNOSIS — R21 Rash and other nonspecific skin eruption: Secondary | ICD-10-CM

## 2017-03-19 DIAGNOSIS — L03115 Cellulitis of right lower limb: Secondary | ICD-10-CM | POA: Diagnosis not present

## 2017-03-19 NOTE — Progress Notes (Signed)
BP 93/61 (BP Location: Left Arm, Patient Position: Sitting, Cuff Size: Large)   Pulse 74   Temp 98.3 F (36.8 C)   Wt 257 lb 6.4 oz (116.8 kg)   SpO2 95%   BMI 44.18 kg/m    Subjective:    Patient ID: Felicia Vargas, female    DOB: 11/21/1945, 71 y.o.   MRN: [id number]  HPI: Felicia Vargas is a 71 y.o. female  Chief Complaint  Patient presents with  . Follow-up    Cellulitis   Doing much better. 1 dose of Abx still. Has finished the valtrex. Still swelling. Has not been elevating enough. Has not been wearing any compression stockings. No pain, no more redness, No fevers. No chills. Otherwise doing well. She does note that she has been having some pain in her Right groin. She thinks that it started when she was walking funny with her leg swelling. It has been going on about 1 week. It's sore and tight. Better with stretching. Worse with sitting too long. No numbness or tingling. Otherwise doing well with no concerns.   Relevant past medical, surgical, family and social history reviewed and updated as indicated. Interim medical history since our last visit reviewed. Allergies and medications reviewed and updated.  Review of Systems  Constitutional: Negative.   Respiratory: Negative.   Cardiovascular: Positive for leg swelling. Negative for chest pain and palpitations.  Musculoskeletal: Positive for gait problem and myalgias. Negative for arthralgias, back pain, joint swelling, neck pain and neck stiffness.    Per HPI unless specifically indicated above     Objective:    BP 93/61 (BP Location: Left Arm, Patient Position: Sitting, Cuff Size: Large)   Pulse 74   Temp 98.3 F (36.8 C)   Wt 257 lb 6.4 oz (116.8 kg)   SpO2 95%   BMI 44.18 kg/m   Wt Readings from Last 3 Encounters:  03/19/17 257 lb 6.4 oz (116.8 kg)  03/10/17 255 lb (115.7 kg)  03/09/17 255 lb 8 oz (115.9 kg)    Physical Exam  Constitutional: She is oriented to person, place, and time. She appears  well-developed and well-nourished. No distress.  HENT:  Head: Normocephalic and atraumatic.  Right Ear: Hearing normal.  Left Ear: Hearing normal.  Nose: Nose normal.  Eyes: Conjunctivae and lids are normal. Right eye exhibits no discharge. Left eye exhibits no discharge. No scleral icterus.  Cardiovascular: Normal rate, regular rhythm, normal heart sounds and intact distal pulses.  Exam reveals no gallop and no friction rub.   No murmur heard. Pulmonary/Chest: Effort normal and breath sounds normal. No respiratory distress. She has no wheezes. She has no rales. She exhibits no tenderness.  Musculoskeletal: Normal range of motion. She exhibits edema (1+ edema on the R) and tenderness (R groin on the psoas). She exhibits no deformity.  Neurological: She is alert and oriented to person, place, and time.  Skin: Skin is warm, dry and intact. No rash noted. No erythema. No pallor.  Psychiatric: She has a normal mood and affect. Her speech is normal and behavior is normal. Judgment and thought content normal. Cognition and memory are normal.  Nursing note and vitals reviewed.   Results for orders placed or performed during the hospital encounter of 00/45/99  Basic metabolic panel  Result Value Ref Range   Sodium 139 135 - 145 mmol/L   Potassium 3.9 3.5 - 5.1 mmol/L   Chloride 107 101 - 111 mmol/L   CO2 26 22 -  32 mmol/L   Glucose, Bld 86 65 - 99 mg/dL   BUN 20 6 - 20 mg/dL   Creatinine, Ser 0.77 0.44 - 1.00 mg/dL   Calcium 9.6 8.9 - 10.3 mg/dL   GFR calc non Af Amer >60 >60 mL/min   GFR calc Af Amer >60 >60 mL/min   Anion gap 6 5 - 15  CBC with Differential  Result Value Ref Range   WBC 6.8 3.6 - 11.0 K/uL   RBC 3.56 (L) 3.80 - 5.20 MIL/uL   Hemoglobin 11.9 (L) 12.0 - 16.0 g/dL   HCT 33.7 (L) 35.0 - 47.0 %   MCV 94.7 80.0 - 100.0 fL   MCH 33.3 26.0 - 34.0 pg   MCHC 35.2 32.0 - 36.0 g/dL   RDW 12.6 11.5 - 14.5 %   Platelets 277 150 - 440 K/uL   Neutrophils Relative % 55 %   Neutro  Abs 3.7 1.4 - 6.5 K/uL   Lymphocytes Relative 30 %   Lymphs Abs 2.0 1.0 - 3.6 K/uL   Monocytes Relative 8 %   Monocytes Absolute 0.6 0.2 - 0.9 K/uL   Eosinophils Relative 5 %   Eosinophils Absolute 0.4 0 - 0.7 K/uL   Basophils Relative 2 %   Basophils Absolute 0.1 0 - 0.1 K/uL      Assessment & Plan:   Problem List Items Addressed This Visit    None    Visit Diagnoses    Leg edema, right    -  Primary   Will start compression stockings. Rx given today- she will pick them up. Call with any concerns.    Cellulitis of right lower extremity       Resolved. Continue to monitor.    Pain of right thigh       Likely a pulled muscle from walking funny. Will start her flexeril back up. Call with any problems or if not getting better.    Rash       Resolved. Continue to monitor.        Follow up plan: Return if symptoms worsen or fail to improve.

## 2017-08-07 ENCOUNTER — Ambulatory Visit (INDEPENDENT_AMBULATORY_CARE_PROVIDER_SITE_OTHER): Payer: Medicare HMO | Admitting: Family Medicine

## 2017-08-07 ENCOUNTER — Encounter: Payer: Self-pay | Admitting: Family Medicine

## 2017-08-07 VITALS — BP 121/75 | HR 76 | Temp 97.3°F | Resp 18 | Wt 255.6 lb

## 2017-08-07 DIAGNOSIS — M25561 Pain in right knee: Secondary | ICD-10-CM

## 2017-08-07 DIAGNOSIS — I10 Essential (primary) hypertension: Secondary | ICD-10-CM

## 2017-08-07 DIAGNOSIS — E7800 Pure hypercholesterolemia, unspecified: Secondary | ICD-10-CM | POA: Diagnosis not present

## 2017-08-07 DIAGNOSIS — Z23 Encounter for immunization: Secondary | ICD-10-CM | POA: Diagnosis not present

## 2017-08-07 MED ORDER — PREDNISONE 50 MG PO TABS: 50.0000 mg | ORAL_TABLET | Freq: Every day | ORAL | 0 refills | Status: DC

## 2017-08-07 MED ORDER — CYCLOBENZAPRINE HCL 10 MG PO TABS: ORAL_TABLET | ORAL | 0 refills | Status: DC

## 2017-08-07 NOTE — Patient Instructions (Addendum)

## 2017-08-07 NOTE — Assessment & Plan Note (Signed)
Under good control. Continue current regimen. Continue to monitor. Call with any concerns. 

## 2017-08-07 NOTE — Progress Notes (Signed)
BP 121/75 (BP Location: Left Arm, Patient Position: Sitting)   Pulse 76   Temp (!) 97.3 F (36.3 C)   Resp 18   Wt 255 lb 9.6 oz (115.9 kg)   BMI 43.87 kg/m    Subjective:    Patient ID: Felicia Vargas, female    DOB: [birth year], 71 y.o.   MRN: [id number]  HPI: Felicia Vargas is a 71 y.o. female  Chief Complaint  Patient presents with  . Hypertension  . Hyperlipidemia   HYPERTENSION / HYPERLIPIDEMIA Satisfied with current treatment? yes Duration of hypertension: chronic BP monitoring frequency: not checking BP medication side effects: no Past BP meds: lisinopril-hctz Duration of hyperlipidemia: chronic Cholesterol medication side effects: no Cholesterol supplements: none Past cholesterol medications: pravastatin Medication compliance: excellent compliance Aspirin: yes Recent stressors: no Recurrent headaches: no Visual changes: no Palpitations: no Dyspnea: no Chest pain: no Lower extremity edema: no Dizzy/lightheaded: no  KNEE PAIN Duration: 1.5 weeks Involved knee: right Mechanism of injury: unknown Location:diffuse Onset: sudden Severity: 5 today, 8 last weekend  Quality:  aching Frequency: intermittent with walking around Radiation: no Aggravating factors: weight bearing and walking  Alleviating factors: brace and rest  Status: better Treatments attempted: brace, rest and aleve  Relief with NSAIDs?:  mild Weakness with weight bearing or walking: no Sensation of giving way: no Locking: no Popping: yes Bruising: no Swelling: yes Redness: no Paresthesias/decreased sensation: no Fevers: no  Relevant past medical, surgical, family and social history reviewed and updated as indicated. Interim medical history since our last visit reviewed. Allergies and medications reviewed and updated.  Review of Systems  Constitutional: Negative.   Respiratory: Negative.   Cardiovascular: Negative.   Musculoskeletal: Positive for arthralgias. Negative for  back pain, gait problem, joint swelling, myalgias, neck pain and neck stiffness.  Psychiatric/Behavioral: Negative.     Per HPI unless specifically indicated above     Objective:    BP 121/75 (BP Location: Left Arm, Patient Position: Sitting)   Pulse 76   Temp (!) 97.3 F (36.3 C)   Resp 18   Wt 255 lb 9.6 oz (115.9 kg)   BMI 43.87 kg/m   Wt Readings from Last 3 Encounters:  08/07/17 255 lb 9.6 oz (115.9 kg)  03/19/17 257 lb 6.4 oz (116.8 kg)  03/10/17 255 lb (115.7 kg)    Physical Exam  Constitutional: She is oriented to person, place, and time. She appears well-developed and well-nourished. No distress.  HENT:  Head: Normocephalic and atraumatic.  Right Ear: Hearing normal.  Left Ear: Hearing normal.  Nose: Nose normal.  Eyes: Conjunctivae and lids are normal. Right eye exhibits no discharge. Left eye exhibits no discharge. No scleral icterus.  Cardiovascular: Normal rate, regular rhythm, normal heart sounds and intact distal pulses.  Exam reveals no gallop and no friction rub.   No murmur heard. Pulmonary/Chest: Effort normal and breath sounds normal. No respiratory distress. She has no wheezes. She has no rales. She exhibits no tenderness.  Musculoskeletal: She exhibits edema and tenderness. She exhibits no deformity.  Slight effusion of R knee with tenderness along the joint line, negative lachman's, negative mcmurrays, negative appley's compression and distraction.  Neurological: She is alert and oriented to person, place, and time.  Skin: Skin is warm, dry and intact. No rash noted. She is not diaphoretic. No erythema. No pallor.  Psychiatric: She has a normal mood and affect. Her speech is normal and behavior is normal. Judgment and thought content normal. Cognition and memory  are normal.  Nursing note and vitals reviewed.   Results for orders placed or performed during the hospital encounter of 10/93/23  Basic metabolic panel  Result Value Ref Range   Sodium 139  135 - 145 mmol/L   Potassium 3.9 3.5 - 5.1 mmol/L   Chloride 107 101 - 111 mmol/L   CO2 26 22 - 32 mmol/L   Glucose, Bld 86 65 - 99 mg/dL   BUN 20 6 - 20 mg/dL   Creatinine, Ser 0.77 0.44 - 1.00 mg/dL   Calcium 9.6 8.9 - 10.3 mg/dL   GFR calc non Af Amer >60 >60 mL/min   GFR calc Af Amer >60 >60 mL/min   Anion gap 6 5 - 15  CBC with Differential  Result Value Ref Range   WBC 6.8 3.6 - 11.0 K/uL   RBC 3.56 (L) 3.80 - 5.20 MIL/uL   Hemoglobin 11.9 (L) 12.0 - 16.0 g/dL   HCT 33.7 (L) 35.0 - 47.0 %   MCV 94.7 80.0 - 100.0 fL   MCH 33.3 26.0 - 34.0 pg   MCHC 35.2 32.0 - 36.0 g/dL   RDW 12.6 11.5 - 14.5 %   Platelets 277 150 - 440 K/uL   Neutrophils Relative % 55 %   Neutro Abs 3.7 1.4 - 6.5 K/uL   Lymphocytes Relative 30 %   Lymphs Abs 2.0 1.0 - 3.6 K/uL   Monocytes Relative 8 %   Monocytes Absolute 0.6 0.2 - 0.9 K/uL   Eosinophils Relative 5 %   Eosinophils Absolute 0.4 0 - 0.7 K/uL   Basophils Relative 2 %   Basophils Absolute 0.1 0 - 0.1 K/uL      Assessment & Plan:   Problem List Items Addressed This Visit      Cardiovascular and Mediastinum   Hypertension - Primary    Under good control. Continue current regimen. Continue to monitor. Call with any concerns.       Relevant Orders   Comprehensive metabolic panel     Other   High cholesterol    Under good control. Continue current regimen. Continue to monitor. Call with any concerns.       Relevant Orders   Comprehensive metabolic panel   Lipid Panel w/o Chol/HDL Ratio    Other Visit Diagnoses    Need for influenza vaccination       Flu shot given today.   Relevant Orders   Flu vaccine HIGH DOSE PF (Fluzone High dose) (Completed)   Acute pain of right knee       Likely arthritis. Will treat with 5 days of prednisone, if not better, call and we will obtain x-ray of that knee.       Follow up plan: Return in about 6 months (around 02/04/2018) for Wellness.

## 2017-08-08 LAB — COMPREHENSIVE METABOLIC PANEL
ALK PHOS: 92 IU/L (ref 39–117)
ALT: 17 IU/L (ref 0–32)
AST: 21 IU/L (ref 0–40)
Albumin/Globulin Ratio: 1.6 (ref 1.2–2.2)
Albumin: 4.1 g/dL (ref 3.6–4.8)
BUN/Creatinine Ratio: 30 — ABNORMAL HIGH (ref 12–28)
BUN: 25 mg/dL (ref 8–27)
Bilirubin Total: 0.7 mg/dL (ref 0.0–1.2)
CO2: 23 mmol/L (ref 20–29)
CREATININE: 0.84 mg/dL (ref 0.57–1.00)
Calcium: 10 mg/dL (ref 8.7–10.3)
Chloride: 101 mmol/L (ref 96–106)
GFR calc Af Amer: 82 mL/min/{1.73_m2} (ref >59)
GFR calc non Af Amer: 71 mL/min/{1.73_m2} (ref >59)
GLOBULIN, TOTAL: 2.6 g/dL (ref 1.5–4.5)
GLUCOSE: 84 mg/dL (ref 65–99)
Potassium: 4.4 mmol/L (ref 3.5–5.2)
SODIUM: 140 mmol/L (ref 134–144)
Total Protein: 6.7 g/dL (ref 6.0–8.5)

## 2017-08-08 LAB — LIPID PANEL W/O CHOL/HDL RATIO
CHOLESTEROL TOTAL: 164 mg/dL (ref 100–199)
HDL: 49 mg/dL (ref >39)
LDL CALC: 94 mg/dL (ref 0–99)
TRIGLYCERIDES: 105 mg/dL (ref 0–149)
VLDL Cholesterol Cal: 21 mg/dL (ref 5–40)

## 2017-10-09 ENCOUNTER — Other Ambulatory Visit: Payer: Self-pay | Admitting: Family Medicine

## 2017-10-15 DIAGNOSIS — D485 Neoplasm of uncertain behavior of skin: Secondary | ICD-10-CM | POA: Diagnosis not present

## 2017-11-20 ENCOUNTER — Encounter: Payer: Self-pay | Admitting: Family Medicine

## 2017-11-20 ENCOUNTER — Ambulatory Visit (INDEPENDENT_AMBULATORY_CARE_PROVIDER_SITE_OTHER): Payer: Medicare HMO | Admitting: Family Medicine

## 2017-11-20 VITALS — BP 99/63 | HR 76 | Temp 98.7°F | Wt 226.6 lb

## 2017-11-20 DIAGNOSIS — J069 Acute upper respiratory infection, unspecified: Secondary | ICD-10-CM

## 2017-11-20 MED ORDER — PREDNISONE 10 MG PO TABS: ORAL_TABLET | ORAL | 0 refills | Status: DC

## 2017-11-20 MED ORDER — BENZONATATE 200 MG PO CAPS: 200.0000 mg | ORAL_CAPSULE | Freq: Two times a day (BID) | ORAL | 0 refills | Status: DC | PRN

## 2017-11-20 MED ORDER — AZITHROMYCIN 250 MG PO TABS: ORAL_TABLET | ORAL | 0 refills | Status: DC

## 2017-11-20 MED ORDER — HYDROCOD POLST-CPM POLST ER 10-8 MG/5ML PO SUER: 5.0000 mL | Freq: Two times a day (BID) | ORAL | 0 refills | Status: DC | PRN

## 2017-11-20 NOTE — Progress Notes (Signed)
   BP 99/63 (BP Location: Left Arm, Patient Position: Sitting, Cuff Size: Large)   Pulse 76   Temp 98.7 F (37.1 C) (Temporal)   Wt 226 lb 9.6 oz (102.8 kg)   SpO2 99%   BMI 38.90 kg/m    Subjective:    Patient ID: Felicia Vargas, female    DOB: [birth year], 72 y.o.   MRN: [id number]  HPI: Felicia Vargas is a 72 y.o. female  Chief Complaint  Patient presents with  . Cough    x's 6 days. Productive cough. Green sputum.  . Chest Congestion  . Nasal Congestion   Productive cough, congestion, chest tightness malaise x 1 week. Taking OTC cold and cough meds which helped in the beginning but now much worse. Denies CP, SOB, wheezing, fevers. Daughter also sick.   Relevant past medical, surgical, family and social history reviewed and updated as indicated. Interim medical history since our last visit reviewed. Allergies and medications reviewed and updated.  Review of Systems  Constitutional: Positive for fatigue.  HENT: Positive for congestion.   Respiratory: Positive for cough and chest tightness.   Cardiovascular: Negative.   Gastrointestinal: Negative.   Genitourinary: Negative.   Musculoskeletal: Negative.   Neurological: Negative.   Psychiatric/Behavioral: Negative.    Per HPI unless specifically indicated above     Objective:    BP 99/63 (BP Location: Left Arm, Patient Position: Sitting, Cuff Size: Large)   Pulse 76   Temp 98.7 F (37.1 C) (Temporal)   Wt 226 lb 9.6 oz (102.8 kg)   SpO2 99%   BMI 38.90 kg/m   Wt Readings from Last 3 Encounters:  11/20/17 226 lb 9.6 oz (102.8 kg)  08/07/17 255 lb 9.6 oz (115.9 kg)  03/19/17 257 lb 6.4 oz (116.8 kg)    Physical Exam  Constitutional: She is oriented to person, place, and time. She appears well-developed and well-nourished. No distress.  HENT:  Head: Atraumatic.  Right Ear: External ear normal.  Left Ear: External ear normal.  Mouth/Throat: No oropharyngeal exudate.  Nasal mucosa erythematous with thick  drainage present Oropharynx injected  Eyes: Conjunctivae are normal. Pupils are equal, round, and reactive to light.  Neck: Normal range of motion. Neck supple.  Cardiovascular: Normal rate and normal heart sounds.  Pulmonary/Chest: Effort normal. No respiratory distress. She has wheezes. She has no rales.  Musculoskeletal: Normal range of motion.  Neurological: She is alert and oriented to person, place, and time.  Skin: Skin is warm and dry.  Psychiatric: She has a normal mood and affect. Her behavior is normal.  Nursing note and vitals reviewed.     Assessment & Plan:   Problem List Items Addressed This Visit    None    Visit Diagnoses    Upper respiratory tract infection, unspecified type    -  Primary   Will tx with zpack, prednisone taper, tessalon, and tussionex. Humidifier, mucinex, saline drops, etc. Sedation precautions reviewed with tussionex.    Relevant Medications   azithromycin (ZITHROMAX) 250 MG tablet       Follow up plan: Return if symptoms worsen or fail to improve.

## 2017-11-22 NOTE — Patient Instructions (Signed)
Follow up as needed

## 2017-12-14 ENCOUNTER — Other Ambulatory Visit: Payer: Self-pay | Admitting: Family Medicine

## 2018-01-06 ENCOUNTER — Telehealth: Payer: Self-pay

## 2018-01-06 NOTE — Telephone Encounter (Signed)
Copied from [place] [id number]. Topic: Medicare AWV >> Jan 06, 2018  1:41 PM [place], [place] A, LPN wrote: Reason for CRM: called to schedule medicare annual wellness visit with [facility] at [facility]. Can be scheduled anytime after 02/03/2018   If scheduled please place in appt note: last awv 02/02/2017

## 2018-01-18 DIAGNOSIS — L570 Actinic keratosis: Secondary | ICD-10-CM | POA: Diagnosis not present

## 2018-01-18 DIAGNOSIS — L578 Other skin changes due to chronic exposure to nonionizing radiation: Secondary | ICD-10-CM | POA: Diagnosis not present

## 2018-01-18 DIAGNOSIS — Z859 Personal history of malignant neoplasm, unspecified: Secondary | ICD-10-CM | POA: Diagnosis not present

## 2018-01-18 DIAGNOSIS — L918 Other hypertrophic disorders of the skin: Secondary | ICD-10-CM | POA: Diagnosis not present

## 2018-02-01 ENCOUNTER — Encounter: Payer: Self-pay | Admitting: Family Medicine

## 2018-02-01 ENCOUNTER — Ambulatory Visit
Admission: RE | Admit: 2018-02-01 | Discharge: 2018-02-01 | Disposition: A | Discharge status: Home / Self Care | Payer: Medicare HMO | Source: Ambulatory Visit | Attending: Family Medicine | Admitting: Family Medicine

## 2018-02-01 ENCOUNTER — Ambulatory Visit (INDEPENDENT_AMBULATORY_CARE_PROVIDER_SITE_OTHER): Payer: Medicare HMO | Admitting: Family Medicine

## 2018-02-01 ENCOUNTER — Ambulatory Visit (INDEPENDENT_AMBULATORY_CARE_PROVIDER_SITE_OTHER): Payer: Medicare HMO

## 2018-02-01 VITALS — BP 130/78 | HR 66 | Temp 97.7°F | Resp 16 | Ht 64.0 in | Wt 227.7 lb

## 2018-02-01 VITALS — BP 130/78 | HR 66 | Temp 97.7°F | Resp 16 | Wt 227.7 lb

## 2018-02-01 DIAGNOSIS — M19012 Primary osteoarthritis, left shoulder: Secondary | ICD-10-CM | POA: Insufficient documentation

## 2018-02-01 DIAGNOSIS — M4802 Spinal stenosis, cervical region: Secondary | ICD-10-CM | POA: Diagnosis not present

## 2018-02-01 DIAGNOSIS — M25512 Pain in left shoulder: Secondary | ICD-10-CM

## 2018-02-01 DIAGNOSIS — I10 Essential (primary) hypertension: Secondary | ICD-10-CM

## 2018-02-01 DIAGNOSIS — Z1231 Encounter for screening mammogram for malignant neoplasm of breast: Secondary | ICD-10-CM | POA: Diagnosis not present

## 2018-02-01 DIAGNOSIS — N3281 Overactive bladder: Secondary | ICD-10-CM | POA: Diagnosis not present

## 2018-02-01 DIAGNOSIS — G43709 Chronic migraine without aura, not intractable, without status migrainosus: Secondary | ICD-10-CM | POA: Diagnosis not present

## 2018-02-01 DIAGNOSIS — M5116 Intervertebral disc disorders with radiculopathy, lumbar region: Secondary | ICD-10-CM | POA: Insufficient documentation

## 2018-02-01 DIAGNOSIS — M5412 Radiculopathy, cervical region: Secondary | ICD-10-CM

## 2018-02-01 DIAGNOSIS — E7800 Pure hypercholesterolemia, unspecified: Secondary | ICD-10-CM | POA: Diagnosis not present

## 2018-02-01 DIAGNOSIS — Z0000 Encounter for general adult medical examination without abnormal findings: Secondary | ICD-10-CM

## 2018-02-01 DIAGNOSIS — M542 Cervicalgia: Secondary | ICD-10-CM | POA: Diagnosis not present

## 2018-02-01 DIAGNOSIS — Z1239 Encounter for other screening for malignant neoplasm of breast: Secondary | ICD-10-CM

## 2018-02-01 LAB — UA/M W/RFLX CULTURE, ROUTINE
Bilirubin, UA: NEGATIVE
Glucose, UA: NEGATIVE
KETONES UA: NEGATIVE
LEUKOCYTES UA: NEGATIVE
NITRITE UA: NEGATIVE
Protein, UA: NEGATIVE
RBC, UA: NEGATIVE
Specific Gravity, UA: 1.02 (ref 1.005–1.030)
Urobilinogen, Ur: 0.2 mg/dL (ref 0.2–1.0)
pH, UA: 5.5 (ref 5.0–7.5)

## 2018-02-01 MED ORDER — LISINOPRIL-HYDROCHLOROTHIAZIDE 20-25 MG PO TABS: 1.0000 | ORAL_TABLET | Freq: Every day | ORAL | 3 refills | Status: DC

## 2018-02-01 MED ORDER — PRAVASTATIN SODIUM 20 MG PO TABS: 20.0000 mg | ORAL_TABLET | Freq: Every day | ORAL | 3 refills | Status: DC

## 2018-02-01 MED ORDER — OXYBUTYNIN CHLORIDE 5 MG PO TABS: ORAL_TABLET | ORAL | 3 refills | Status: DC

## 2018-02-01 NOTE — Patient Instructions (Addendum)
Felicia Vargas , Thank you for taking time to come for your Medicare Wellness Visit. I appreciate your ongoing commitment to your health goals. Please review the following plan we discussed and let me know if I can assist you in the future.   Screening recommendations/referrals: Colonoscopy: completed 10/15/2016 Mammogram: completed 02/24/2017. Please call [phone] to schedule your mammogram for April. Bone Density: completed 02/24/2017 Recommended yearly ophthalmology/optometry visit for glaucoma screening and checkup Recommended yearly dental visit for hygiene and checkup  Vaccinations: Influenza vaccine: up to date Pneumococcal vaccine: up to date  Tdap vaccine: up to date  Shingles vaccine: eligible, check with your insurance company for coverage    Advanced directives: Please bring a copy of your health care power of attorney and living will to the office at your convenience.  Conditions/risks identified: Recommend continue drinking at least 6-8 glasses of water a day   Next appointment: Follow up in one year for your annual wellness exam.    Preventive Care 70 Years and Older, Female Preventive care refers to lifestyle choices and visits with your health care provider that can promote health and wellness. What does preventive care include?  A yearly physical exam. This is also called an annual well check.  Dental exams once or twice a year.  Routine eye exams. Ask your health care provider how often you should have your eyes checked.  Personal lifestyle choices, including:  Daily care of your teeth and gums.  Regular physical activity.  Eating a healthy diet.  Avoiding tobacco and drug use.  Limiting alcohol use.  Practicing safe sex.  Taking low-dose aspirin every day.  Taking vitamin and mineral supplements as recommended by your health care provider. What happens during an annual well check? The services and screenings done by your health care provider during  your annual well check will depend on your age, overall health, lifestyle risk factors, and family history of disease. Counseling  Your health care provider may ask you questions about your:  Alcohol use.  Tobacco use.  Drug use.  Emotional well-being.  Home and relationship well-being.  Sexual activity.  Eating habits.  History of falls.  Memory and ability to understand (cognition).  Work and work Statistician.  Reproductive health. Screening  You may have the following tests or measurements:  Height, weight, and BMI.  Blood pressure.  Lipid and cholesterol levels. These may be checked every 5 years, or more frequently if you are over 70 years old.  Skin check.  Lung cancer screening. You may have this screening every year starting at age 36 if you have a 30-pack-year history of smoking and currently smoke or have quit within the past 15 years.  Fecal occult blood test (FOBT) of the stool. You may have this test every year starting at age 70.  Flexible sigmoidoscopy or colonoscopy. You may have a sigmoidoscopy every 5 years or a colonoscopy every 10 years starting at age 40.  Hepatitis C blood test.  Hepatitis B blood test.  Sexually transmitted disease (STD) testing.  Diabetes screening. This is done by checking your blood sugar (glucose) after you have not eaten for a while (fasting). You may have this done every 1-3 years.  Bone density scan. This is done to screen for osteoporosis. You may have this done starting at age 34.  Mammogram. This may be done every 1-2 years. Talk to your health care provider about how often you should have regular mammograms. Talk with your health care provider about your  test results, treatment options, and if necessary, the need for more tests. Vaccines  Your health care provider may recommend certain vaccines, such as:  Influenza vaccine. This is recommended every year.  Tetanus, diphtheria, and acellular pertussis (Tdap,  Td) vaccine. You may need a Td booster every 10 years.  Zoster vaccine. You may need this after age 70.  Pneumococcal 13-valent conjugate (PCV13) vaccine. One dose is recommended after age 7.  Pneumococcal polysaccharide (PPSV23) vaccine. One dose is recommended after age 70. Talk to your health care provider about which screenings and vaccines you need and how often you need them. This information is not intended to replace advice given to you by your health care provider. Make sure you discuss any questions you have with your health care provider. Document Released: 11/30/2015 Document Revised: 07/23/2016 Document Reviewed: 09/04/2015 Elsevier Interactive Patient Education  2017 [place] Prevention in the Home Falls can cause injuries. They can happen to people of all ages. There are many things you can do to make your home safe and to help prevent falls. What can I do on the outside of my home?  Regularly fix the edges of walkways and driveways and fix any cracks.  Remove anything that might make you trip as you walk through a door, such as a raised step or threshold.  Trim any bushes or trees on the path to your home.  Use bright outdoor lighting.  Clear any walking paths of anything that might make someone trip, such as rocks or tools.  Regularly check to see if handrails are loose or broken. Make sure that both sides of any steps have handrails.  Any raised decks and porches should have guardrails on the edges.  Have any leaves, snow, or ice cleared regularly.  Use sand or salt on walking paths during winter.  Clean up any spills in your garage right away. This includes oil or grease spills. What can I do in the bathroom?  Use night lights.  Install grab bars by the toilet and in the tub and shower. Do not use towel bars as grab bars.  Use non-skid mats or decals in the tub or shower.  If you need to sit down in the shower, use a plastic, non-slip  stool.  Keep the floor dry. Clean up any water that spills on the floor as soon as it happens.  Remove soap buildup in the tub or shower regularly.  Attach bath mats securely with double-sided non-slip rug tape.  Do not have throw rugs and other things on the floor that can make you trip. What can I do in the bedroom?  Use night lights.  Make sure that you have a light by your bed that is easy to reach.  Do not use any sheets or blankets that are too big for your bed. They should not hang down onto the floor.  Have a firm chair that has side arms. You can use this for support while you get dressed.  Do not have throw rugs and other things on the floor that can make you trip. What can I do in the kitchen?  Clean up any spills right away.  Avoid walking on wet floors.  Keep items that you use a lot in easy-to-reach places.  If you need to reach something above you, use a strong step stool that has a grab bar.  Keep electrical cords out of the way.  Do not use floor polish or wax that  makes floors slippery. If you must use wax, use non-skid floor wax.  Do not have throw rugs and other things on the floor that can make you trip. What can I do with my stairs?  Do not leave any items on the stairs.  Make sure that there are handrails on both sides of the stairs and use them. Fix handrails that are broken or loose. Make sure that handrails are as long as the stairways.  Check any carpeting to make sure that it is firmly attached to the stairs. Fix any carpet that is loose or worn.  Avoid having throw rugs at the top or bottom of the stairs. If you do have throw rugs, attach them to the floor with carpet tape.  Make sure that you have a light switch at the top of the stairs and the bottom of the stairs. If you do not have them, ask someone to add them for you. What else can I do to help prevent falls?  Wear shoes that:  Do not have high heels.  Have rubber bottoms.  Are  comfortable and fit you well.  Are closed at the toe. Do not wear sandals.  If you use a stepladder:  Make sure that it is fully opened. Do not climb a closed stepladder.  Make sure that both sides of the stepladder are locked into place.  Ask someone to hold it for you, if possible.  Clearly mark and make sure that you can see:  Any grab bars or handrails.  First and last steps.  Where the edge of each step is.  Use tools that help you move around (mobility aids) if they are needed. These include:  Canes.  Walkers.  Scooters.  Crutches.  Turn on the lights when you go into a dark area. Replace any light bulbs as soon as they burn out.  Set up your furniture so you have a clear path. Avoid moving your furniture around.  If any of your floors are uneven, fix them.  If there are any pets around you, be aware of where they are.  Review your medicines with your doctor. Some medicines can make you feel dizzy. This can increase your chance of falling. Ask your doctor what other things that you can do to help prevent falls. This information is not intended to replace advice given to you by your health care provider. Make sure you discuss any questions you have with your health care provider. Document Released: 08/30/2009 Document Revised: 04/10/2016 Document Reviewed: 12/08/2014 Elsevier Interactive Patient Education  2017 [employer].

## 2018-02-01 NOTE — Progress Notes (Signed)
Subjective:   Felicia Vargas is a 72 y.o. female who presents for Medicare Annual (Subsequent) preventive examination.  Review of Systems:   Cardiac Risk Factors include: hypertension;advanced age (>47men, >74 women);obesity (BMI >30kg/m2);dyslipidemia     Objective:     Vitals: BP 130/78 (BP Location: Left Arm, Patient Position: Sitting)   Pulse 66   Temp 97.7 F (36.5 C) (Temporal)   Resp 16   Ht 5\' 4"  (1.626 m)   Wt 227 lb 11.2 oz (103.3 kg)   BMI 39.08 kg/m   Body mass index is 39.08 kg/m.  Advanced Directives 02/01/2018 02/02/2017 03/23/2016 01/18/2016  Does Patient Have a Medical Advance Directive? Yes Yes Yes Yes  Type of Paramedic of [place];Living will [place];Living will [place];Living will Living will;Healthcare Power of Attorney  Does patient want to make changes to medical advance directive? - No - Patient declined - Yes - information given  Copy of [place] in Chart? No - copy requested No - copy requested - No - copy requested    Tobacco Social History   Tobacco Use  Smoking Status Never Smoker  Smokeless Tobacco Never Used     Counseling given: Not Answered   Clinical Intake:  Pre-visit preparation completed: Yes  Pain : 0-10 Pain Score: 6  Pain Type: Acute pain Pain Location: Shoulder Pain Orientation: Left Pain Descriptors / Indicators: Aching Pain Onset: In the past 7 days Pain Frequency: Intermittent     Nutritional Status: BMI > 30  Obese Nutritional Risks: None Diabetes: No  How often do you need to have someone help you when you read instructions, pamphlets, or other written materials from your doctor or pharmacy?: 1 - Never What is the last grade level you completed in school?: 12th grade  Interpreter Needed?: No  Information entered by :: Tiffany Hill,LPN   Past Medical History:  Diagnosis Date  . Allergy   . Arthritis   . Cancer ([place])      skin  . Diffuse cystic mastopathy 2013  . High cholesterol   . Hypertension 1990's  . Migraine   . Migraines   . Overactive bladder   . Urinary incontinence    Past Surgical History:  Procedure Laterality Date  . BREAST BIOPSY Right 05/31/93   neg/lump  . COLONOSCOPY  11/2004   Dr. Chauncey [place]  . COLONOSCOPY WITH PROPOFOL N/A 10/15/2016   Procedure: COLONOSCOPY WITH PROPOFOL;  Surgeon: Christene Lye, MD;  Location: [facility] ENDOSCOPY;  Service: Endoscopy;  Laterality: N/A;  . KIDNEY SURGERY  1998  . LITHOTRIPSY  1998   Family History  Problem Relation Age of Onset  . Lung cancer Father   . Stroke Mother   . Hypertension Sister   . Arthritis Sister   . Breast cancer Neg Hx    Social History   Socioeconomic History  . Marital status: Widowed    Spouse name: None  . Number of children: None  . Years of education: None  . Highest education level: None  Social Needs  . Financial resource strain: Not hard at all  . Food insecurity - worry: Never true  . Food insecurity - inability: Never true  . Transportation needs - medical: No  . Transportation needs - non-medical: No  Occupational History  . None  Tobacco Use  . Smoking status: Never Smoker  . Smokeless tobacco: Never Used  Substance and Sexual Activity  . Alcohol use: No  .  Drug use: No  . Sexual activity: No  Other Topics Concern  . None  Social History Narrative  . None    Outpatient Encounter Medications as of 02/01/2018  Medication Sig  . aspirin 81 MG tablet Take 81 mg by mouth daily.  . calcium carbonate (OS-CAL) 600 MG TABS Take 600 mg by mouth daily with breakfast.   . Cholecalciferol (VITAMIN D PO) Take 1 capsule by mouth daily.  . cyclobenzaprine (FLEXERIL) 5 MG tablet TAKE 2 TABLETS BY MOUTH TWICE A DAY AS NEEDED FOR SPASM  . docusate sodium (COLACE) 100 MG capsule Take 100 mg by mouth daily.   . fluticasone (FLONASE) 50 MCG/ACT nasal spray PLACE 1 SPRAY INTO BOTH NOSTRILS DAILY. (Patient  taking differently: Place 1 spray into both nostrils 2 (two) times daily. )  . lisinopril-hydrochlorothiazide (PRINZIDE,ZESTORETIC) 20-25 MG tablet Take 1 tablet by mouth daily.  Marland Kitchen loratadine (CLARITIN) 10 MG tablet Take 10 mg by mouth daily.  . Multiple Vitamin (MULTIVITAMIN) tablet Take 1 tablet by mouth daily.  . naproxen sodium (ANAPROX) 220 MG tablet Take 220 mg by mouth every morning.  Marland Kitchen oxybutynin (DITROPAN) 5 MG tablet TAKE 1 TABLET (5 MG) BY ORAL ROUTE 2 TIMES PER DAY  . pravastatin (PRAVACHOL) 20 MG tablet Take 1 tablet (20 mg total) by mouth at bedtime.  . TURMERIC PO Take 1 tablet by mouth 2 (two) times daily.   Marland Kitchen triamcinolone ointment (KENALOG) 0.5 % Apply 1 application topically 2 (two) times daily. (Patient not taking: Reported on 02/01/2018)  . [DISCONTINUED] azithromycin (ZITHROMAX) 250 MG tablet Take 2 tabs day one, then 1 tab daily until complete (Patient not taking: Reported on 02/01/2018)  . [DISCONTINUED] benzonatate (TESSALON) 200 MG capsule Take 1 capsule (200 mg total) by mouth 2 (two) times daily as needed for cough. (Patient not taking: Reported on 02/01/2018)  . [DISCONTINUED] chlorpheniramine-HYDROcodone (TUSSIONEX PENNKINETIC ER) 10-8 MG/5ML SUER Take 5 mLs by mouth every 12 (twelve) hours as needed. (Patient not taking: Reported on 02/01/2018)  . [DISCONTINUED] predniSONE (DELTASONE) 10 MG tablet Take 6 tabs day one, 5 tabs day two, 4 tabs day three, etc (Patient not taking: Reported on 02/01/2018)   No facility-administered encounter medications on file as of 02/01/2018.     Activities of Daily Living In your present state of health, do you have any difficulty performing the following activities: 02/01/2018 03/06/2017  Hearing? N N  Vision? N N  Difficulty concentrating or making decisions? N N  Walking or climbing stairs? Y Y  Comment arthritis  Arthritis  Dressing or bathing? N N  Doing errands, shopping? N N  Preparing Food and eating ? N -  Using the Toilet? N  -  In the past six months, have you accidently leaked urine? Y -  Comment pads -  Do you have problems with loss of bowel control? N -  Managing your Medications? N -  Managing your Finances? N -  Housekeeping or managing your Housekeeping? N -  Some recent data might be hidden    Patient Care Team: Valerie Roys, DO as PCP - General (Family Medicine) Rico Junker, RN as Registered Nurse Kem Parkinson, MD (Ophthalmology) Silvano Bilis, PA (Dermatology) Jannet Mantis, MD (Dermatology)    Assessment:   This is a routine wellness examination for Felicia Vargas.  Exercise Activities and Dietary recommendations Current Exercise Habits: The patient does not participate in regular exercise at present, Exercise limited by: None identified  Goals    .  DIET - INCREASE WATER INTAKE     Recommend continue drinking at least 6-8 glasses of water a day        Fall Risk Fall Risk  02/01/2018 02/02/2017 01/18/2016  Falls in the past year? Yes Yes No  Number falls in past yr: 1 1 -  Injury with Fall? Yes Yes -  Comment - stitches in forehead -  Risk Factor Category  High Fall Risk - -  Follow up Falls prevention discussed - -   Is the patient's home free of loose throw rugs in walkways, pet beds, electrical cords, etc?   yes      Grab bars in the bathroom? no      Handrails on the stairs?   no stairs in home       Adequate lighting?   yes  Timed Get Up and Go performed: Completed in 8 seconds with no use of assistive devices, steady gait. No intervention needed at this time.   Depression Screen PHQ 2/9 Scores 02/01/2018 02/02/2017 01/18/2016  PHQ - 2 Score 0 0 0     Cognitive Function     6CIT Screen 02/01/2018 02/02/2017  What Year? 0 points 0 points  What month? 0 points 0 points  What time? 0 points 0 points  Count back from 20 0 points 0 points  Months in reverse 0 points 0 points  Repeat phrase 0 points 4 points  Total Score 0 4    Immunization History   Administered Date(s) Administered  . Influenza, High Dose Seasonal PF 08/25/2016, 08/07/2017  . Influenza-Unspecified 08/31/2015  . Pneumococcal Conjugate-13 01/18/2016  . Pneumococcal Polysaccharide-23 02/02/2017  . Td 01/18/2016    Qualifies for Shingles Vaccine?yes, discussed shingrix vaccine   Screening Tests Health Maintenance  Topic Date Due  . MAMMOGRAM  02/25/2019  . TETANUS/TDAP  01/17/2026  . COLONOSCOPY  10/15/2026  . INFLUENZA VACCINE  Completed  . DEXA SCAN  Completed  . Hepatitis C Screening  Completed  . PNA vac Low Risk Adult  Completed    Cancer Screenings: Lung: Low Dose CT Chest recommended if Age 49-80 years, 30 pack-year currently smoking OR have quit w/in 15years. Patient does not qualify. Breast:  Up to date on Mammogram? Yes   Up to date of Bone Density/Dexa? Yes Colorectal: completed 10/15/2016  Additional Screenings:  Hepatitis B/HIV/Syphillis: not indicated  Hepatitis C Screening: completed 02/02/2017     Plan:    I have personally reviewed and addressed the Medicare Annual Wellness questionnaire and have noted the following in the patient's chart:  A. Medical and social history B. Use of alcohol, tobacco or illicit drugs  C. Current medications and supplements D. Functional ability and status E.  Nutritional status F.  Physical activity G. Advance directives H. List of other physicians I.  Hospitalizations, surgeries, and ER visits in previous 12 months J.  [place] such as hearing and vision if needed, cognitive and depression L. Referrals and appointments   In addition, I have reviewed and discussed with patient certain preventive protocols, quality metrics, and best practice recommendations. A written personalized care plan for preventive services as well as general preventive health recommendations were provided to patient.   Signed,  Tyler Aas, LPN Nurse Health Advisor   Nurse Notes:none

## 2018-02-01 NOTE — Assessment & Plan Note (Signed)
Under good control. Continue current regimen. Continue to monitor. Call with any concerns. 

## 2018-02-01 NOTE — Assessment & Plan Note (Signed)
Under good control. Continue current regimen. Continue to monitor. Call with any concerns. Refills given.  

## 2018-02-01 NOTE — Patient Instructions (Addendum)
Cervical Strain and Sprain Rehab Ask your health care provider which exercises are safe for you. Do exercises exactly as told by your health care provider and adjust them as directed. It is normal to feel mild stretching, pulling, tightness, or discomfort as you do these exercises, but you should stop right away if you feel sudden pain or your pain gets worse.Do not begin these exercises until told by your health care provider. Stretching and range of motion exercises These exercises warm up your muscles and joints and improve the movement and flexibility of your neck. These exercises also help to relieve pain, numbness, and tingling. Exercise A: Cervical side bend  1. Using good posture, sit on a stable chair or stand up. 2. Without moving your shoulders, slowly tilt your left / right ear to your shoulder until you feel a stretch in your neck muscles. You should be looking straight ahead. 3. Hold for __________ seconds. 4. Repeat with the other side of your neck. Repeat __________ times. Complete this exercise __________ times a day. Exercise B: Cervical rotation  1. Using good posture, sit on a stable chair or stand up. 2. Slowly turn your head to the side as if you are looking over your left / right shoulder. ? Keep your eyes level with the ground. ? Stop when you feel a stretch along the side and the back of your neck. 3. Hold for __________ seconds. 4. Repeat this by turning to your other side. Repeat __________ times. Complete this exercise __________ times a day. Exercise C: Thoracic extension and pectoral stretch 1. Roll a towel or a small blanket so it is about 4 inches (10 cm) in diameter. 2. Lie down on your back on a firm surface. 3. Put the towel lengthwise, under your spine in the middle of your back. It should not be not under your shoulder blades. The towel should line up with your spine from your middle back to your lower back. 4. Put your hands behind your head and let your  elbows fall out to your sides. 5. Hold for __________ seconds. Repeat __________ times. Complete this exercise __________ times a day. Strengthening exercises These exercises build strength and endurance in your neck. Endurance is the ability to use your muscles for a long time, even after your muscles get tired. Exercise D: Upper cervical flexion, isometric 1. Lie on your back with a thin pillow behind your head and a small rolled-up towel under your neck. 2. Gently tuck your chin toward your chest and nod your head down to look toward your feet. Do not lift your head off the pillow. 3. Hold for __________ seconds. 4. Release the tension slowly. Relax your neck muscles completely before you repeat this exercise. Repeat __________ times. Complete this exercise __________ times a day. Exercise E: Cervical extension, isometric  1. Stand about 6 inches (15 cm) away from a wall, with your back facing the wall. 2. Place a soft object, about 6-8 inches (15-20 cm) in diameter, between the back of your head and the wall. A soft object could be a small pillow, a ball, or a folded towel. 3. Gently tilt your head back and press into the soft object. Keep your jaw and forehead relaxed. 4. Hold for __________ seconds. 5. Release the tension slowly. Relax your neck muscles completely before you repeat this exercise. Repeat __________ times. Complete this exercise __________ times a day. Posture and body mechanics  Body mechanics refers to the movements and positions of   your body while you do your daily activities. Posture is part of body mechanics. Good posture and healthy body mechanics can help to relieve stress in your body's tissues and joints. Good posture means that your spine is in its natural S-curve position (your spine is neutral), your shoulders are pulled back slightly, and your head is not tipped forward. The following are general guidelines for applying improved posture and body mechanics to  your everyday activities. Standing  When standing, keep your spine neutral and keep your feet about hip-width apart. Keep a slight bend in your knees. Your ears, shoulders, and hips should line up.  When you do a task in which you stand in one place for a long time, place one foot up on a stable object that is 2-4 inches (5-10 cm) high, such as a footstool. This helps keep your spine neutral. Sitting   When sitting, keep your spine neutral and your keep feet flat on the floor. Use a footrest, if necessary, and keep your thighs parallel to the floor. Avoid rounding your shoulders, and avoid tilting your head forward.  When working at a desk or a computer, keep your desk at a height where your hands are slightly lower than your elbows. Slide your chair under your desk so you are close enough to maintain good posture.  When working at a computer, place your monitor at a height where you are looking straight ahead and you do not have to tilt your head forward or downward to look at the screen. Resting When lying down and resting, avoid positions that are most painful for you. Try to support your neck in a neutral position. You can use a contour pillow or a small rolled-up towel. Your pillow should support your neck but not push on it. This information is not intended to replace advice given to you by your health care provider. Make sure you discuss any questions you have with your health care provider. Document Released: 11/03/2005 Document Revised: 07/10/2016 Document Reviewed: 10/10/2015 [employer] Interactive Patient Education  2018 [employer]  

## 2018-02-01 NOTE — Progress Notes (Signed)
BP 130/78   Pulse 66   Temp 97.7 F (36.5 C) (Oral)   Resp 16   Wt 227 lb 11.2 oz (103.3 kg)   BMI 39.08 kg/m    Subjective:    Patient ID: Felicia Vargas, female    DOB: 1946/08/11, 72 y.o.   MRN: [id number]  HPI: TELISA OHLSEN is a 72 y.o. female  Chief Complaint  Patient presents with  . Hypertension  . Hyperlipidemia   Slipped and fell on Friday and fell into a bag of boxes. Fell onto her L shoulder. Has been hurting a lot since then. Has been having pain and numbness and tingling that radiate into her neck. Nothing making it better or worse. She notes that she has been very weak.   HYPERTENSION / HYPERLIPIDEMIA Satisfied with current treatment? yes Duration of hypertension: chronic BP monitoring frequency: not checking BP medication side effects: no Past BP meds: lisinopril-hctz Duration of hyperlipidemia: chronic Cholesterol medication side effects: non Cholesterol supplements: none Past cholesterol medications: pravastatin Medication compliance: excellent compliance Aspirin: yes Recent stressors: no Recurrent headaches: no Visual changes: no Palpitations: no Dyspnea: no Chest pain: no Lower extremity edema: no Dizzy/lightheaded: no  Migraines under good control. Stable. No concerns.  Bladder doing well. No concerns. Doing well on her medicine.   Relevant past medical, surgical, family and social history reviewed and updated as indicated. Interim medical history since our last visit reviewed. Allergies and medications reviewed and updated.  Review of Systems  Constitutional: Negative.   Respiratory: Negative.   Cardiovascular: Negative.   Musculoskeletal: Positive for arthralgias, joint swelling and myalgias. Negative for back pain, gait problem, neck pain and neck stiffness.  Skin: Negative.   Psychiatric/Behavioral: Negative.     Per HPI unless specifically indicated above     Objective:    BP 130/78   Pulse 66   Temp 97.7 F (36.5 C)  (Oral)   Resp 16   Wt 227 lb 11.2 oz (103.3 kg)   BMI 39.08 kg/m   Wt Readings from Last 3 Encounters:  02/01/18 227 lb 11.2 oz (103.3 kg)  02/01/18 227 lb 11.2 oz (103.3 kg)  11/20/17 226 lb 9.6 oz (102.8 kg)    Physical Exam  Constitutional: She is oriented to person, place, and time. She appears well-developed and well-nourished. No distress.  HENT:  Head: Normocephalic and atraumatic.  Right Ear: Hearing normal.  Left Ear: Hearing normal.  Nose: Nose normal.  Eyes: Conjunctivae and lids are normal. Right eye exhibits no discharge. Left eye exhibits no discharge. No scleral icterus.  Cardiovascular: Normal rate, regular rhythm, normal heart sounds and intact distal pulses. Exam reveals no gallop and no friction rub.  No murmur heard. Pulmonary/Chest: Effort normal and breath sounds normal. No respiratory distress. She has no wheezes. She has no rales. She exhibits no tenderness.  Musculoskeletal: She exhibits edema and tenderness. She exhibits no deformity.  Decreased ROM to L shoulder, unable to abduct past about 60 degrees, tenderness to humeral head and significant trap spasm on the L  Neurological: She is alert and oriented to person, place, and time.  Skin: Skin is warm, dry and intact. No rash noted. She is not diaphoretic. No erythema. No pallor.  Psychiatric: She has a normal mood and affect. Her speech is normal and behavior is normal. Judgment and thought content normal. Cognition and memory are normal.  Nursing note and vitals reviewed.   Results for orders placed or performed in visit on 08/07/17  Comprehensive metabolic panel  Result Value Ref Range   Glucose 84 65 - 99 mg/dL   BUN 25 8 - 27 mg/dL   Creatinine, Ser 0.84 0.57 - 1.00 mg/dL   GFR calc non Af Amer 71 >59 mL/min/1.73   GFR calc Af Amer 82 >59 mL/min/1.73   BUN/Creatinine Ratio 30 (H) 12 - 28   Sodium 140 134 - 144 mmol/L   Potassium 4.4 3.5 - 5.2 mmol/L   Chloride 101 96 - 106 mmol/L   CO2 23 20 -  29 mmol/L   Calcium 10.0 8.7 - 10.3 mg/dL   Total Protein 6.7 6.0 - 8.5 g/dL   Albumin 4.1 3.6 - 4.8 g/dL   Globulin, Total 2.6 1.5 - 4.5 g/dL   Albumin/Globulin Ratio 1.6 1.2 - 2.2   Bilirubin Total 0.7 0.0 - 1.2 mg/dL   Alkaline Phosphatase 92 39 - 117 IU/L   AST 21 0 - 40 IU/L   ALT 17 0 - 32 IU/L  Lipid Panel w/o Chol/HDL Ratio  Result Value Ref Range   Cholesterol, Total 164 100 - 199 mg/dL   Triglycerides 105 0 - 149 mg/dL   HDL 49 >39 mg/dL   VLDL Cholesterol Cal 21 5 - 40 mg/dL   LDL Calculated 94 0 - 99 mg/dL      Assessment & Plan:   Problem List Items Addressed This Visit      Cardiovascular and Mediastinum   Hypertension - Primary    Under good control. Continue current regimen. Continue to monitor. Call with any concerns. Refills given.       Relevant Medications   pravastatin (PRAVACHOL) 20 MG tablet   lisinopril-hydrochlorothiazide (PRINZIDE,ZESTORETIC) 20-25 MG tablet   Other Relevant Orders   Comprehensive metabolic panel   Migraines    Under good control. Continue current regimen. Continue to monitor. Call with any concerns.       Relevant Medications   pravastatin (PRAVACHOL) 20 MG tablet   lisinopril-hydrochlorothiazide (PRINZIDE,ZESTORETIC) 20-25 MG tablet   Other Relevant Orders   Comprehensive metabolic panel     Genitourinary   OAB (overactive bladder)    Under good control. Continue current regimen. Continue to monitor. Call with any concerns. Refills given.       Relevant Orders   Comprehensive metabolic panel   UA/M w/rflx Culture, Routine     Other   High cholesterol    Under good control. Continue current regimen. Continue to monitor. Call with any concerns. Refills given.       Relevant Medications   pravastatin (PRAVACHOL) 20 MG tablet   lisinopril-hydrochlorothiazide (PRINZIDE,ZESTORETIC) 20-25 MG tablet   Other Relevant Orders   Comprehensive metabolic panel   Lipid Panel w/o Chol/HDL Ratio    Other Visit Diagnoses     Acute pain of left shoulder       Will obtain x-ray and pending results get her into PT. Continue to monitor. Call with any concerns.    Relevant Orders   DG Shoulder Left   Cervical radiculopathy       Will obtain x-ray and pending results get her into PT. Continue to monitor. Call with any concerns.    Relevant Orders   DG Cervical Spine Complete       Follow up plan: Return in about 6 months (around 08/04/2018) for physical.

## 2018-02-02 ENCOUNTER — Telehealth: Payer: Self-pay | Admitting: Family Medicine

## 2018-02-02 ENCOUNTER — Other Ambulatory Visit: Payer: Self-pay | Admitting: Family Medicine

## 2018-02-02 LAB — LIPID PANEL W/O CHOL/HDL RATIO
Cholesterol, Total: 152 mg/dL (ref 100–199)
HDL: 45 mg/dL (ref >39)
LDL Calculated: 83 mg/dL (ref 0–99)
TRIGLYCERIDES: 121 mg/dL (ref 0–149)
VLDL Cholesterol Cal: 24 mg/dL (ref 5–40)

## 2018-02-02 NOTE — Telephone Encounter (Signed)
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 Let's do the PT first before we try to get her into see the bone doctor.   The rest of her blood work looked nice and normal. Thanks!

## 2018-02-02 NOTE — Telephone Encounter (Signed)
Patient notified

## 2018-02-03 NOTE — Telephone Encounter (Signed)
Flexeril refill request  LOV 02/01/18  Dr. Wynetta Emery  [id number] - [place], [place]  [address]

## 2018-02-04 ENCOUNTER — Ambulatory Visit: Payer: Self-pay | Admitting: Family Medicine

## 2018-02-10 DIAGNOSIS — M25512 Pain in left shoulder: Secondary | ICD-10-CM | POA: Diagnosis not present

## 2018-02-15 DIAGNOSIS — M25512 Pain in left shoulder: Secondary | ICD-10-CM | POA: Diagnosis not present

## 2018-02-18 DIAGNOSIS — M25512 Pain in left shoulder: Secondary | ICD-10-CM | POA: Diagnosis not present

## 2018-02-22 DIAGNOSIS — M25512 Pain in left shoulder: Secondary | ICD-10-CM | POA: Diagnosis not present

## 2018-02-24 DIAGNOSIS — M25512 Pain in left shoulder: Secondary | ICD-10-CM | POA: Diagnosis not present

## 2018-03-01 DIAGNOSIS — M25512 Pain in left shoulder: Secondary | ICD-10-CM | POA: Diagnosis not present

## 2018-03-03 DIAGNOSIS — M25512 Pain in left shoulder: Secondary | ICD-10-CM | POA: Diagnosis not present

## 2018-03-08 DIAGNOSIS — M25512 Pain in left shoulder: Secondary | ICD-10-CM | POA: Diagnosis not present

## 2018-03-10 DIAGNOSIS — M25512 Pain in left shoulder: Secondary | ICD-10-CM | POA: Diagnosis not present

## 2018-03-15 ENCOUNTER — Ambulatory Visit
Admission: RE | Admit: 2018-03-15 | Discharge: 2018-03-15 | Disposition: A | Discharge status: Home / Self Care | Payer: Medicare HMO | Source: Ambulatory Visit | Attending: Family Medicine | Admitting: Family Medicine

## 2018-03-15 DIAGNOSIS — Z1239 Encounter for other screening for malignant neoplasm of breast: Secondary | ICD-10-CM

## 2018-03-15 DIAGNOSIS — Z1231 Encounter for screening mammogram for malignant neoplasm of breast: Secondary | ICD-10-CM | POA: Insufficient documentation

## 2018-03-17 DIAGNOSIS — M25512 Pain in left shoulder: Secondary | ICD-10-CM | POA: Diagnosis not present

## 2018-03-24 DIAGNOSIS — M25512 Pain in left shoulder: Secondary | ICD-10-CM | POA: Diagnosis not present

## 2018-03-25 ENCOUNTER — Other Ambulatory Visit: Payer: Self-pay | Admitting: Family Medicine

## 2018-03-31 DIAGNOSIS — M25512 Pain in left shoulder: Secondary | ICD-10-CM | POA: Diagnosis not present

## 2018-08-09 ENCOUNTER — Encounter: Payer: Self-pay | Admitting: Family Medicine

## 2018-08-09 ENCOUNTER — Ambulatory Visit (INDEPENDENT_AMBULATORY_CARE_PROVIDER_SITE_OTHER): Payer: Medicare HMO | Admitting: Family Medicine

## 2018-08-09 ENCOUNTER — Other Ambulatory Visit: Payer: Self-pay

## 2018-08-09 VITALS — BP 110/72 | HR 73 | Temp 97.9°F | Ht 64.0 in | Wt 257.0 lb

## 2018-08-09 DIAGNOSIS — Z0000 Encounter for general adult medical examination without abnormal findings: Secondary | ICD-10-CM | POA: Diagnosis not present

## 2018-08-09 DIAGNOSIS — I10 Essential (primary) hypertension: Secondary | ICD-10-CM

## 2018-08-09 DIAGNOSIS — G43709 Chronic migraine without aura, not intractable, without status migrainosus: Secondary | ICD-10-CM

## 2018-08-09 DIAGNOSIS — Z23 Encounter for immunization: Secondary | ICD-10-CM | POA: Diagnosis not present

## 2018-08-09 DIAGNOSIS — N3281 Overactive bladder: Secondary | ICD-10-CM | POA: Diagnosis not present

## 2018-08-09 DIAGNOSIS — E7800 Pure hypercholesterolemia, unspecified: Secondary | ICD-10-CM

## 2018-08-09 LAB — UA/M W/RFLX CULTURE, ROUTINE
BILIRUBIN UA: NEGATIVE
Glucose, UA: NEGATIVE
KETONES UA: NEGATIVE
Nitrite, UA: NEGATIVE
PH UA: 6 (ref 5.0–7.5)
Protein, UA: NEGATIVE
RBC UA: NEGATIVE
SPEC GRAV UA: 1.01 (ref 1.005–1.030)
Urobilinogen, Ur: 0.2 mg/dL (ref 0.2–1.0)

## 2018-08-09 LAB — MICROALBUMIN, URINE WAIVED
Creatinine, Urine Waived: 50 mg/dL (ref 10–300)
Microalb, Ur Waived: 10 mg/L (ref 0–19)
Microalb/Creat Ratio: <30 mg/g (ref <30)

## 2018-08-09 LAB — MICROSCOPIC EXAMINATION: Bacteria, UA: NONE SEEN

## 2018-08-09 MED ORDER — OXYBUTYNIN CHLORIDE 5 MG PO TABS: ORAL_TABLET | ORAL | 3 refills | Status: DC

## 2018-08-09 MED ORDER — PRAVASTATIN SODIUM 20 MG PO TABS: 20.0000 mg | ORAL_TABLET | Freq: Every day | ORAL | 3 refills | Status: DC

## 2018-08-09 MED ORDER — FLUTICASONE PROPIONATE 50 MCG/ACT NA SUSP: 1.0000 | Freq: Every day | NASAL | 12 refills | Status: DC

## 2018-08-09 MED ORDER — LISINOPRIL-HYDROCHLOROTHIAZIDE 20-25 MG PO TABS: 1.0000 | ORAL_TABLET | Freq: Every day | ORAL | 3 refills | Status: DC

## 2018-08-09 MED ORDER — CYCLOBENZAPRINE HCL 5 MG PO TABS: 5.0000 mg | ORAL_TABLET | Freq: Every day | ORAL | 0 refills | Status: DC

## 2018-08-09 NOTE — Assessment & Plan Note (Signed)
Under good control on current regimen. Continue current regimen. Continue to monitor. Call with any concerns. Refills given.   

## 2018-08-09 NOTE — Progress Notes (Signed)
BP 110/72   Pulse 73   Temp 97.9 F (36.6 C) (Oral)   Ht 5\' 4"  (1.626 m)   Wt 257 lb (116.6 kg)   SpO2 99%   BMI 44.11 kg/m    Subjective:    Patient ID: Felicia Vargas, female    DOB: 11-Aug-1946, 71 y.o.   MRN: [id number]  HPI: Felicia Vargas is a 72 y.o. female presenting on 08/09/2018 for comprehensive medical examination. Current medical complaints include:  HYPERTENSION / HYPERLIPIDEMIA Satisfied with current treatment? yes Duration of hypertension: chronic BP monitoring frequency: not checking BP medication side effects: no Past BP meds: lisinopril-hctz Duration of hyperlipidemia: chronic Cholesterol medication side effects: no Cholesterol supplements: none Past cholesterol medications: pravastatin Medication compliance: excellent compliance Aspirin: yes Recent stressors: no Recurrent headaches: no Visual changes: no Palpitations: no Dyspnea: no Chest pain: no Lower extremity edema: no Dizzy/lightheaded: no  She currently lives with: daughter Menopausal Symptoms: no  Functional Status Survey: Is the patient deaf or have difficulty hearing?: No Does the patient have difficulty seeing, even when wearing glasses/contacts?: No Does the patient have difficulty concentrating, remembering, or making decisions?: No Does the patient have difficulty walking or climbing stairs?: No Does the patient have difficulty dressing or bathing?: No Does the patient have difficulty doing errands alone such as visiting a doctor's office or shopping?: No  Fall Risk  08/09/2018 02/01/2018 02/02/2017 01/18/2016  Falls in the past year? Yes Yes Yes No  Number falls in past yr: 1 1 1  -  Injury with Fall? Yes Yes Yes -  Comment - - stitches in forehead -  Risk Factor Category  - High Fall Risk - -  Risk for fall due to : History of fall(s) - - -  Follow up - Falls prevention discussed - -    Depression Screen Depression screen [facility] 2/9 08/09/2018 02/01/2018 02/02/2017 01/18/2016    Decreased Interest 0 0 0 0  Down, Depressed, Hopeless 0 0 0 0  PHQ - 2 Score 0 0 0 0  Altered sleeping 0 - - -  Tired, decreased energy 0 - - -  Change in appetite 0 - - -  Feeling bad or failure about yourself  0 - - -  Trouble concentrating 0 - - -  Moving slowly or fidgety/restless 0 - - -  Suicidal thoughts 0 - - -  PHQ-9 Score 0 - - -  Difficult doing work/chores Not difficult at all - - -    Advanced Directives Does patient have a HCPOA?    yes Does patient have a living will or MOST form?  yes  Past Medical History:  Past Medical History:  Diagnosis Date  . Allergy   . Arthritis   . Cancer ([place])    skin  . Diffuse cystic mastopathy 2013  . High cholesterol   . Hypertension 1990's  . Migraine   . Migraines   . Overactive bladder   . Urinary incontinence     Surgical History:  Past Surgical History:  Procedure Laterality Date  . BREAST BIOPSY Right 05/31/93   neg/lump  . COLONOSCOPY  11/2004   Dr. Chauncey Reading  . COLONOSCOPY WITH PROPOFOL N/A 10/15/2016   Procedure: COLONOSCOPY WITH PROPOFOL;  Surgeon: Christene Lye, MD;  Location: [facility] ENDOSCOPY;  Service: Endoscopy;  Laterality: N/A;  . KIDNEY SURGERY  1998  . LITHOTRIPSY  1998    Medications:  Current Outpatient Medications on File Prior to Visit  Medication Sig  .  aspirin 81 MG tablet Take 81 mg by mouth daily.  . calcium carbonate (OS-CAL) 600 MG TABS Take 600 mg by mouth daily with breakfast.   . Cholecalciferol (VITAMIN D PO) Take 1 capsule by mouth daily.  Marland Kitchen docusate sodium (COLACE) 100 MG capsule Take 100 mg by mouth daily.   Marland Kitchen loratadine (CLARITIN) 10 MG tablet Take 10 mg by mouth daily.  . Multiple Vitamin (MULTIVITAMIN) tablet Take 1 tablet by mouth daily.  . naproxen sodium (ANAPROX) 220 MG tablet Take 220 mg by mouth every morning.  . triamcinolone ointment (KENALOG) 0.5 % Apply 1 application topically 2 (two) times daily.  . TURMERIC PO Take 1 tablet by mouth 2 (two) times daily.    No  current facility-administered medications on file prior to visit.     Allergies:  No Known Allergies  Social History:  Social History   Socioeconomic History  . Marital status: Widowed    Spouse name: Not on file  . Number of children: Not on file  . Years of education: Not on file  . Highest education level: Not on file  Occupational History  . Not on file  Social Needs  . Financial resource strain: Not hard at all  . Food insecurity:    Worry: Never true    Inability: Never true  . Transportation needs:    Medical: No    Non-medical: No  Tobacco Use  . Smoking status: Never Smoker  . Smokeless tobacco: Never Used  Substance and Sexual Activity  . Alcohol use: No  . Drug use: No  . Sexual activity: Never  Lifestyle  . Physical activity:    Days per week: 0 days    Minutes per session: 0 min  . Stress: Not at all  Relationships  . Social connections:    Talks on phone: More than three times a week    Gets together: More than three times a week    Attends religious service: More than 4 times per year    Active member of club or organization: No    Attends meetings of clubs or organizations: Never    Relationship status: Widowed  . Intimate partner violence:    Fear of current or ex partner: No    Emotionally abused: No    Physically abused: No    Forced sexual activity: No  Other Topics Concern  . Not on file  Social History Narrative  . Not on file   Social History   Tobacco Use  Smoking Status Never Smoker  Smokeless Tobacco Never Used   Social History   Substance and Sexual Activity  Alcohol Use No    Family History:  Family History  Problem Relation Age of Onset  . Lung cancer Father   . Stroke Mother   . Hypertension Sister   . Arthritis Sister   . Dementia Sister   . Breast cancer Neg Hx     Past medical history, surgical history, medications, allergies, family history and social history reviewed with patient today and changes made to  appropriate areas of the chart.   Review of Systems  Constitutional: Negative.   HENT: Negative.   Eyes: Negative.   Respiratory: Negative.   Cardiovascular: Positive for leg swelling. Negative for chest pain, palpitations, orthopnea, claudication and PND.  Gastrointestinal: Positive for heartburn. Negative for abdominal pain, blood in stool, constipation, diarrhea, melena, nausea and vomiting.  Genitourinary: Negative.   Musculoskeletal: Positive for joint pain. Negative for back pain, falls,  myalgias and neck pain.  Skin: Negative.   Neurological: Negative.   Endo/Heme/Allergies: Negative for environmental allergies and polydipsia. Bruises/bleeds easily.  Psychiatric/Behavioral: Negative.     All other ROS negative except what is listed above and in the HPI.      Objective:    BP 110/72   Pulse 73   Temp 97.9 F (36.6 C) (Oral)   Ht 5\' 4"  (1.626 m)   Wt 257 lb (116.6 kg)   SpO2 99%   BMI 44.11 kg/m   Wt Readings from Last 3 Encounters:  08/09/18 257 lb (116.6 kg)  02/01/18 227 lb 11.2 oz (103.3 kg)  02/01/18 227 lb 11.2 oz (103.3 kg)    Physical Exam  Constitutional: She is oriented to person, place, and time. She appears well-developed and well-nourished. No distress.  HENT:  Head: Normocephalic and atraumatic.  Right Ear: Hearing, tympanic membrane, external ear and ear canal normal.  Left Ear: Hearing, tympanic membrane, external ear and ear canal normal.  Nose: Nose normal.  Mouth/Throat: Oropharynx is clear and moist. No oropharyngeal exudate.  Eyes: Pupils are equal, round, and reactive to light. Conjunctivae, EOM and lids are normal. Right eye exhibits no discharge. Left eye exhibits no discharge. No scleral icterus.  Neck: Normal range of motion. Neck supple. No JVD present. No tracheal deviation present. No thyromegaly present.  Cardiovascular: Normal rate, regular rhythm, normal heart sounds and intact distal pulses. Exam reveals no gallop and no friction  rub.  No murmur heard. Pulmonary/Chest: Effort normal and breath sounds normal. No stridor. No respiratory distress. She has no wheezes. She has no rales. She exhibits no tenderness.  Abdominal: Soft. Bowel sounds are normal. She exhibits no distension and no mass. There is no tenderness. There is no rebound and no guarding. No hernia.  Genitourinary:  Genitourinary Comments: Breast and pelvic exams deferred with shared decision making  Musculoskeletal: Normal range of motion. She exhibits no edema, tenderness or deformity.  Lymphadenopathy:    She has no cervical adenopathy.  Neurological: She is alert and oriented to person, place, and time. She displays normal reflexes. No cranial nerve deficit or sensory deficit. She exhibits normal muscle tone. Coordination normal.  Skin: Skin is warm, dry and intact. Capillary refill takes less than 2 seconds. No rash noted. She is not diaphoretic. No erythema. No pallor.  Psychiatric: She has a normal mood and affect. Her speech is normal and behavior is normal. Judgment and thought content normal. Cognition and memory are normal.  Nursing note and vitals reviewed.   6CIT Screen 02/01/2018 02/02/2017  What Year? 0 points 0 points  What month? 0 points 0 points  What time? 0 points 0 points  Count back from 20 0 points 0 points  Months in reverse 0 points 0 points  Repeat phrase 0 points 4 points  Total Score 0 4    Results for orders placed or performed in visit on 02/01/18  Lipid Panel w/o Chol/HDL Ratio  Result Value Ref Range   Cholesterol, Total 152 100 - 199 mg/dL   Triglycerides 121 0 - 149 mg/dL   HDL 45 >39 mg/dL   VLDL Cholesterol Cal 24 5 - 40 mg/dL   LDL Calculated 83 0 - 99 mg/dL  UA/M w/rflx Culture, Routine  Result Value Ref Range   Specific Gravity, UA 1.020 1.005 - 1.030   pH, UA 5.5 5.0 - 7.5   Color, UA Yellow Yellow   Appearance Ur Clear Clear   Leukocytes, UA  Negative Negative   Protein, UA Negative Negative/Trace    Glucose, UA Negative Negative   Ketones, UA Negative Negative   RBC, UA Negative Negative   Bilirubin, UA Negative Negative   Urobilinogen, Ur 0.2 0.2 - 1.0 mg/dL   Nitrite, UA Negative Negative      Assessment & Plan:   Problem List Items Addressed This Visit      Cardiovascular and Mediastinum   Hypertension    Under good control on current regimen. Continue current regimen. Continue to monitor. Call with any concerns. Refills given.        Relevant Medications   lisinopril-hydrochlorothiazide (PRINZIDE,ZESTORETIC) 20-25 MG tablet   pravastatin (PRAVACHOL) 20 MG tablet   Other Relevant Orders   CBC with Differential/Platelet   Comprehensive metabolic panel   Microalbumin, Urine Waived   TSH   Migraines    Under good control on current regimen. Continue current regimen. Continue to monitor. Call with any concerns. Refills given.        Relevant Medications   cyclobenzaprine (FLEXERIL) 5 MG tablet   lisinopril-hydrochlorothiazide (PRINZIDE,ZESTORETIC) 20-25 MG tablet   pravastatin (PRAVACHOL) 20 MG tablet     Genitourinary   OAB (overactive bladder)    Under good control on current regimen. Continue current regimen. Continue to monitor. Call with any concerns. Refills given.        Relevant Orders   CBC with Differential/Platelet   Comprehensive metabolic panel   UA/M w/rflx Culture, Routine     Other   High cholesterol    Under good control on current regimen. Continue current regimen. Continue to monitor. Call with any concerns. Refills given.        Relevant Medications   lisinopril-hydrochlorothiazide (PRINZIDE,ZESTORETIC) 20-25 MG tablet   pravastatin (PRAVACHOL) 20 MG tablet   Other Relevant Orders   CBC with Differential/Platelet   Comprehensive metabolic panel   Lipid Panel w/o Chol/HDL Ratio    Other Visit Diagnoses    Routine general medical examination at a health care facility    -  Primary   Vaccines up to date. Screening labs checked today.  Mammogram, DEXA, colonoscopy up to date. Pap N/A. Continue diet and exercise.    Flu vaccine need       Flu shot given today.   Relevant Orders   Flu vaccine HIGH DOSE PF (Completed)       Preventative Services:  Health Risk Assessment and Personalized Prevention Plan: UTD Bone Mass Measurements: UTD Breast Cancer Screening: UTD CVD Screening: UTD Cervical Cancer Screening: N/A Colon Cancer Screening: UTD Depression Screening: Done today Diabetes Screening: UTD Glaucoma Screening: See your eye doctor Hepatitis B vaccine: N/A Hepatitis C screening: UTD HIV Screening:UTD Flu Vaccine: Given today Lung cancer Screening: N/A Obesity Screening: Done today Pneumonia Vaccines (2): UTD STI Screening: N/A  Follow up plan: Return in about 6 months (around 02/07/2019).   LABORATORY TESTING:  - Pap smear: not applicable  IMMUNIZATIONS:   - Tdap: Tetanus vaccination status reviewed: last tetanus booster within 10 years. - Influenza: Administered today - Pneumovax: Up to date - Prevnar: Up to date - Zostavax vaccine: Not applicable  SCREENING: -Mammogram: Up to date  - Colonoscopy: Up to date  - Bone Density: Up to date   PATIENT COUNSELING:   Advised to take 1 mg of folate supplement per day if capable of pregnancy.   Sexuality: Discussed sexually transmitted diseases, partner selection, use of condoms, avoidance of unintended pregnancy  and contraceptive alternatives.   Advised to avoid  cigarette smoking.  I discussed with the patient that most people either abstain from alcohol or drink within safe limits (<=14/week and <=4 drinks/occasion for males, <=7/weeks and <= 3 drinks/occasion for females) and that the risk for alcohol disorders and other health effects rises proportionally with the number of drinks per week and how often a drinker exceeds daily limits.  Discussed cessation/primary prevention of drug use and availability of treatment for abuse.   Diet: Encouraged  to adjust caloric intake to maintain  or achieve ideal body weight, to reduce intake of dietary saturated fat and total fat, to limit sodium intake by avoiding high sodium foods and not adding table salt, and to maintain adequate dietary potassium and calcium preferably from fresh fruits, vegetables, and low-fat dairy products.    stressed the importance of regular exercise  Injury prevention: Discussed safety belts, safety helmets, smoke detector, smoking near bedding or upholstery.   Dental health: Discussed importance of regular tooth brushing, flossing, and dental visits.    NEXT PREVENTATIVE PHYSICAL DUE IN 1 YEAR. Return in about 6 months (around 02/07/2019).

## 2018-08-09 NOTE — Patient Instructions (Addendum)
Preventative Services:  Health Risk Assessment and Personalized Prevention Plan: UTD Bone Mass Measurements: UTD Breast Cancer Screening: UTD CVD Screening: UTD Cervical Cancer Screening: N/A Colon Cancer Screening: UTD Depression Screening: Done today Diabetes Screening: UTD Glaucoma Screening: See your eye doctor Hepatitis B vaccine: N/A Hepatitis C screening: UTD HIV Screening:UTD Flu Vaccine: Given today Lung cancer Screening: N/A Obesity Screening: Done today Pneumonia Vaccines (2): UTD STI Screening: N/A Health Maintenance for Postmenopausal Women Menopause is a normal process in which your reproductive ability comes to an end. This process happens gradually over a span of months to years, usually between the ages of 72 and 64. Menopause is complete when you have missed 12 consecutive menstrual periods. It is important to talk with your health care provider about some of the most common conditions that affect postmenopausal women, such as heart disease, cancer, and bone loss (osteoporosis). Adopting a healthy lifestyle and getting preventive care can help to promote your health and wellness. Those actions can also lower your chances of developing some of these common conditions. What should I know about menopause? During menopause, you may experience a number of symptoms, such as:  Moderate-to-severe hot flashes.  Night sweats.  Decrease in sex drive.  Mood swings.  Headaches.  Tiredness.  Irritability.  Memory problems.  Insomnia.  Choosing to treat or not to treat menopausal changes is an individual decision that you make with your health care provider. What should I know about hormone replacement therapy and supplements? Hormone therapy products are effective for treating symptoms that are associated with menopause, such as hot flashes and night sweats. Hormone replacement carries certain risks, especially as you become older. If you are thinking about using  estrogen or estrogen with progestin treatments, discuss the benefits and risks with your health care provider. What should I know about heart disease and stroke? Heart disease, heart attack, and stroke become more likely as you age. This may be due, in part, to the hormonal changes that your body experiences during menopause. These can affect how your body processes dietary fats, triglycerides, and cholesterol. Heart attack and stroke are both medical emergencies. There are many things that you can do to help prevent heart disease and stroke:  Have your blood pressure checked at least every 1-2 years. High blood pressure causes heart disease and increases the risk of stroke.  If you are 53-21 years old, ask your health care provider if you should take aspirin to prevent a heart attack or a stroke.  Do not use any tobacco products, including cigarettes, chewing tobacco, or electronic cigarettes. If you need help quitting, ask your health care provider.  It is important to eat a healthy diet and maintain a healthy weight. ? Be sure to include plenty of vegetables, fruits, low-fat dairy products, and lean protein. ? Avoid eating foods that are high in solid fats, added sugars, or salt (sodium).  Get regular exercise. This is one of the most important things that you can do for your health. ? Try to exercise for at least 150 minutes each week. The type of exercise that you do should increase your heart rate and make you sweat. This is known as moderate-intensity exercise. ? Try to do strengthening exercises at least twice each week. Do these in addition to the moderate-intensity exercise.  Know your numbers.Ask your health care provider to check your cholesterol and your blood glucose. Continue to have your blood tested as directed by your health care provider.  What should  I know about cancer screening? There are several types of cancer. Take the following steps to reduce your risk and to catch  any cancer development as early as possible. Breast Cancer  Practice breast self-awareness. ? This means understanding how your breasts normally appear and feel. ? It also means doing regular breast self-exams. Let your health care provider know about any changes, no matter how small.  If you are 60 or older, have a clinician do a breast exam (clinical breast exam or CBE) every year. Depending on your age, family history, and medical history, it may be recommended that you also have a yearly breast X-ray (mammogram).  If you have a family history of breast cancer, talk with your health care provider about genetic screening.  If you are at high risk for breast cancer, talk with your health care provider about having an MRI and a mammogram every year.  Breast cancer (BRCA) gene test is recommended for women who have family members with BRCA-related cancers. Results of the assessment will determine the need for genetic counseling and BRCA1 and for BRCA2 testing. BRCA-related cancers include these types: ? Breast. This occurs in males or females. ? Ovarian. ? Tubal. This may also be called fallopian tube cancer. ? Cancer of the abdominal or pelvic lining (peritoneal cancer). ? Prostate. ? Pancreatic.  Cervical, Uterine, and Ovarian Cancer Your health care provider may recommend that you be screened regularly for cancer of the pelvic organs. These include your ovaries, uterus, and vagina. This screening involves a pelvic exam, which includes checking for microscopic changes to the surface of your cervix (Pap test).  For women ages 21-65, health care providers may recommend a pelvic exam and a Pap test every three years. For women ages 24-65, they may recommend the Pap test and pelvic exam, combined with testing for human papilloma virus (HPV), every five years. Some types of HPV increase your risk of cervical cancer. Testing for HPV may also be done on women of any age who have unclear Pap test  results.  Other health care providers may not recommend any screening for nonpregnant women who are considered low risk for pelvic cancer and have no symptoms. Ask your health care provider if a screening pelvic exam is right for you.  If you have had past treatment for cervical cancer or a condition that could lead to cancer, you need Pap tests and screening for cancer for at least 20 years after your treatment. If Pap tests have been discontinued for you, your risk factors (such as having a new sexual partner) need to be reassessed to determine if you should start having screenings again. Some women have medical problems that increase the chance of getting cervical cancer. In these cases, your health care provider may recommend that you have screening and Pap tests more often.  If you have a family history of uterine cancer or ovarian cancer, talk with your health care provider about genetic screening.  If you have vaginal bleeding after reaching menopause, tell your health care provider.  There are currently no reliable tests available to screen for ovarian cancer.  Lung Cancer Lung cancer screening is recommended for adults 18-57 years old who are at high risk for lung cancer because of a history of smoking. A yearly low-dose CT scan of the lungs is recommended if you:  Currently smoke.  Have a history of at least 30 pack-years of smoking and you currently smoke or have quit within the past 15 years.  A pack-year is smoking an average of one pack of cigarettes per day for one year.  Yearly screening should:  Continue until it has been 15 years since you quit.  Stop if you develop a health problem that would prevent you from having lung cancer treatment.  Colorectal Cancer  This type of cancer can be detected and can often be prevented.  Routine colorectal cancer screening usually begins at age 78 and continues through age 92.  If you have risk factors for colon cancer, your health  care provider may recommend that you be screened at an earlier age.  If you have a family history of colorectal cancer, talk with your health care provider about genetic screening.  Your health care provider may also recommend using home test kits to check for hidden blood in your stool.  A small camera at the end of a tube can be used to examine your colon directly (sigmoidoscopy or colonoscopy). This is done to check for the earliest forms of colorectal cancer.  Direct examination of the colon should be repeated every 5-10 years until age 13. However, if early forms of precancerous polyps or small growths are found or if you have a family history or genetic risk for colorectal cancer, you may need to be screened more often.  Skin Cancer  Check your skin from head to toe regularly.  Monitor any moles. Be sure to tell your health care provider: ? About any new moles or changes in moles, especially if there is a change in a mole's shape or color. ? If you have a mole that is larger than the size of a pencil eraser.  If any of your family members has a history of skin cancer, especially at a young age, talk with your health care provider about genetic screening.  Always use sunscreen. Apply sunscreen liberally and repeatedly throughout the day.  Whenever you are outside, protect yourself by wearing long sleeves, pants, a wide-brimmed hat, and sunglasses.  What should I know about osteoporosis? Osteoporosis is a condition in which bone destruction happens more quickly than new bone creation. After menopause, you may be at an increased risk for osteoporosis. To help prevent osteoporosis or the bone fractures that can happen because of osteoporosis, the following is recommended:  If you are 33-34 years old, get at least 1,000 mg of calcium and at least 600 mg of vitamin D per day.  If you are older than age 27 but younger than age 20, get at least 1,200 mg of calcium and at least 600 mg of  vitamin D per day.  If you are older than age 79, get at least 1,200 mg of calcium and at least 800 mg of vitamin D per day.  Smoking and excessive alcohol intake increase the risk of osteoporosis. Eat foods that are rich in calcium and vitamin D, and do weight-bearing exercises several times each week as directed by your health care provider. What should I know about how menopause affects my mental health? Depression may occur at any age, but it is more common as you become older. Common symptoms of depression include:  Low or sad mood.  Changes in sleep patterns.  Changes in appetite or eating patterns.  Feeling an overall lack of motivation or enjoyment of activities that you previously enjoyed.  Frequent crying spells.  Talk with your health care provider if you think that you are experiencing depression. What should I know about immunizations? It is important that you  get and maintain your immunizations. These include:  Tetanus, diphtheria, and pertussis (Tdap) booster vaccine.  Influenza every year before the flu season begins.  Pneumonia vaccine.  Shingles vaccine.  Your health care provider may also recommend other immunizations. This information is not intended to replace advice given to you by your health care provider. Make sure you discuss any questions you have with your health care provider. Document Released: 12/26/2005 Document Revised: 05/23/2016 Document Reviewed: 08/07/2015 Elsevier Interactive Patient Education  2018 [employer].

## 2018-08-10 LAB — COMPREHENSIVE METABOLIC PANEL
A/G RATIO: 1.6 (ref 1.2–2.2)
ALBUMIN: 3.9 g/dL (ref 3.5–4.8)
ALK PHOS: 90 IU/L (ref 39–117)
ALT: 16 IU/L (ref 0–32)
AST: 20 IU/L (ref 0–40)
BILIRUBIN TOTAL: 0.6 mg/dL (ref 0.0–1.2)
BUN / CREAT RATIO: 28 (ref 12–28)
BUN: 23 mg/dL (ref 8–27)
CHLORIDE: 103 mmol/L (ref 96–106)
CO2: 23 mmol/L (ref 20–29)
Calcium: 9.7 mg/dL (ref 8.7–10.3)
Creatinine, Ser: 0.82 mg/dL (ref 0.57–1.00)
GFR calc non Af Amer: 73 mL/min/{1.73_m2} (ref >59)
GFR, EST AFRICAN AMERICAN: 84 mL/min/{1.73_m2} (ref >59)
Globulin, Total: 2.4 g/dL (ref 1.5–4.5)
Glucose: 91 mg/dL (ref 65–99)
POTASSIUM: 4.7 mmol/L (ref 3.5–5.2)
Sodium: 142 mmol/L (ref 134–144)
TOTAL PROTEIN: 6.3 g/dL (ref 6.0–8.5)

## 2018-08-10 LAB — CBC WITH DIFFERENTIAL/PLATELET
BASOS ABS: 0.1 10*3/uL (ref 0.0–0.2)
BASOS: 2 %
EOS (ABSOLUTE): 0.2 10*3/uL (ref 0.0–0.4)
Eos: 4 %
HEMOGLOBIN: 12 g/dL (ref 11.1–15.9)
Hematocrit: 36.2 % (ref 34.0–46.6)
Immature Grans (Abs): 0 10*3/uL (ref 0.0–0.1)
Immature Granulocytes: 0 %
LYMPHS ABS: 1.5 10*3/uL (ref 0.7–3.1)
Lymphs: 31 %
MCH: 31.3 pg (ref 26.6–33.0)
MCHC: 33.1 g/dL (ref 31.5–35.7)
MCV: 94 fL (ref 79–97)
Monocytes Absolute: 0.3 10*3/uL (ref 0.1–0.9)
Monocytes: 6 %
NEUTROS ABS: 2.7 10*3/uL (ref 1.4–7.0)
Neutrophils: 57 %
PLATELETS: 238 10*3/uL (ref 150–450)
RBC: 3.84 x10E6/uL (ref 3.77–5.28)
RDW: 12.8 % (ref 12.3–15.4)
WBC: 4.8 10*3/uL (ref 3.4–10.8)

## 2018-08-10 LAB — LIPID PANEL W/O CHOL/HDL RATIO
CHOLESTEROL TOTAL: 145 mg/dL (ref 100–199)
HDL: 50 mg/dL (ref >39)
LDL Calculated: 85 mg/dL (ref 0–99)
Triglycerides: 52 mg/dL (ref 0–149)
VLDL Cholesterol Cal: 10 mg/dL (ref 5–40)

## 2018-08-10 LAB — TSH: TSH: 2.71 u[IU]/mL (ref 0.450–4.500)

## 2018-11-19 ENCOUNTER — Other Ambulatory Visit: Payer: Self-pay | Admitting: Family Medicine

## 2018-12-06 ENCOUNTER — Encounter: Payer: Self-pay | Admitting: Family Medicine

## 2018-12-08 ENCOUNTER — Encounter: Payer: Self-pay | Admitting: Family Medicine

## 2018-12-08 ENCOUNTER — Ambulatory Visit (INDEPENDENT_AMBULATORY_CARE_PROVIDER_SITE_OTHER): Payer: Medicare HMO | Admitting: Family Medicine

## 2018-12-08 VITALS — BP 110/70 | HR 75 | Temp 98.4°F | Ht 64.0 in | Wt 254.0 lb

## 2018-12-08 DIAGNOSIS — L03113 Cellulitis of right upper limb: Secondary | ICD-10-CM

## 2018-12-08 MED ORDER — SULFAMETHOXAZOLE-TRIMETHOPRIM 800-160 MG PO TABS: 1.0000 | ORAL_TABLET | Freq: Two times a day (BID) | ORAL | 0 refills | Status: DC

## 2018-12-08 NOTE — Progress Notes (Signed)
BP 110/70   Pulse 75   Temp 98.4 F (36.9 C) (Oral)   Ht 5\' 4"  (1.626 m)   Wt 254 lb (115.2 kg)   SpO2 97%   BMI 43.60 kg/m    Subjective:    Patient ID: Felicia Vargas, female    DOB: September 29, 1946, 73 y.o.   MRN: [id number]  HPI: Felicia Vargas is a 73 y.o. female  Chief Complaint  Patient presents with  . Arm Problem    Right right, fore arm/elbow swelling/redness. First noticed on Monday. Hot to the touch. Sore on elblow is painful. Swelling /redness not painful.    Here today for right arm redness, swelling, and tenderness above and below elbow. First noticed an abrasion on right elbow about 4 days ago, then washed the scab off 2 days ago and had the redness and swelling come on yesterday. Has not been trying anything for it. Denies fevers, decreased ROM, numbness or tingling down into hand, drainage from area.    Relevant past medical, surgical, family and social history reviewed and updated as indicated. Interim medical history since our last visit reviewed. Allergies and medications reviewed and updated.  Review of Systems  Per HPI unless specifically indicated above     Objective:    BP 110/70   Pulse 75   Temp 98.4 F (36.9 C) (Oral)   Ht 5\' 4"  (1.626 m)   Wt 254 lb (115.2 kg)   SpO2 97%   BMI 43.60 kg/m   Wt Readings from Last 3 Encounters:  12/08/18 254 lb (115.2 kg)  08/09/18 257 lb (116.6 kg)  02/01/18 227 lb 11.2 oz (103.3 kg)    Physical Exam Vitals signs and nursing note reviewed.  Constitutional:      Appearance: Normal appearance. She is not ill-appearing.  HENT:     Head: Atraumatic.  Eyes:     Extraocular Movements: Extraocular movements intact.     Conjunctiva/sclera: Conjunctivae normal.  Neck:     Musculoskeletal: Normal range of motion and neck supple.  Cardiovascular:     Rate and Rhythm: Normal rate and regular rhythm.     Heart sounds: Normal heart sounds.  Pulmonary:     Effort: Pulmonary effort is normal.     Breath  sounds: Normal breath sounds.  Musculoskeletal: Normal range of motion.        General: Tenderness (right elbow) present. No swelling (Right UE).  Skin:    General: Skin is warm and dry.     Findings: Erythema present.     Comments: Significant edema and erythema from mid forearm to mid upper arm. Scabbing abrasion to right elbow  Neurological:     Mental Status: She is alert and oriented to person, place, and time.  Psychiatric:        Mood and Affect: Mood normal.        Thought Content: Thought content normal.        Judgment: Judgment normal.     Results for orders placed or performed in visit on 08/09/18  Microscopic Examination  Result Value Ref Range   WBC, UA 0-5 0 - 5 /hpf   RBC, UA 0-2 0 - 2 /hpf   Epithelial Cells (non renal) 0-10 0 - 10 /hpf   Renal Epithel, UA 0-10 (A) None seen /hpf   Bacteria, UA None seen None seen/Few  CBC with Differential/Platelet  Result Value Ref Range   WBC 4.8 3.4 - 10.8 x10E3/uL   RBC 3.84  3.77 - 5.28 x10E6/uL   Hemoglobin 12.0 11.1 - 15.9 g/dL   Hematocrit 36.2 34.0 - 46.6 %   MCV 94 79 - 97 fL   MCH 31.3 26.6 - 33.0 pg   MCHC 33.1 31.5 - 35.7 g/dL   RDW 12.8 12.3 - 15.4 %   Platelets 238 150 - 450 x10E3/uL   Neutrophils 57 Not Estab. %   Lymphs 31 Not Estab. %   Monocytes 6 Not Estab. %   Eos 4 Not Estab. %   Basos 2 Not Estab. %   Neutrophils Absolute 2.7 1.4 - 7.0 x10E3/uL   Lymphocytes Absolute 1.5 0.7 - 3.1 x10E3/uL   Monocytes Absolute 0.3 0.1 - 0.9 x10E3/uL   EOS (ABSOLUTE) 0.2 0.0 - 0.4 x10E3/uL   Basophils Absolute 0.1 0.0 - 0.2 x10E3/uL   Immature Granulocytes 0 Not Estab. %   Immature Grans (Abs) 0.0 0.0 - 0.1 x10E3/uL  Comprehensive metabolic panel  Result Value Ref Range   Glucose 91 65 - 99 mg/dL   BUN 23 8 - 27 mg/dL   Creatinine, Ser 0.82 0.57 - 1.00 mg/dL   GFR calc non Af Amer 73 >59 mL/min/1.73   GFR calc Af Amer 84 >59 mL/min/1.73   BUN/Creatinine Ratio 28 12 - 28   Sodium 142 134 - 144 mmol/L    Potassium 4.7 3.5 - 5.2 mmol/L   Chloride 103 96 - 106 mmol/L   CO2 23 20 - 29 mmol/L   Calcium 9.7 8.7 - 10.3 mg/dL   Total Protein 6.3 6.0 - 8.5 g/dL   Albumin 3.9 3.5 - 4.8 g/dL   Globulin, Total 2.4 1.5 - 4.5 g/dL   Albumin/Globulin Ratio 1.6 1.2 - 2.2   Bilirubin Total 0.6 0.0 - 1.2 mg/dL   Alkaline Phosphatase 90 39 - 117 IU/L   AST 20 0 - 40 IU/L   ALT 16 0 - 32 IU/L  Lipid Panel w/o Chol/HDL Ratio  Result Value Ref Range   Cholesterol, Total 145 100 - 199 mg/dL   Triglycerides 52 0 - 149 mg/dL   HDL 50 >39 mg/dL   VLDL Cholesterol Cal 10 5 - 40 mg/dL   LDL Calculated 85 0 - 99 mg/dL  Microalbumin, Urine Waived  Result Value Ref Range   Microalb, Ur Waived 10 0 - 19 mg/L   Creatinine, Urine Waived 50 10 - 300 mg/dL   Microalb/Creat Ratio <30 <30 mg/g  TSH  Result Value Ref Range   TSH 2.710 0.450 - 4.500 uIU/mL  UA/M w/rflx Culture, Routine  Result Value Ref Range   Specific Gravity, UA 1.010 1.005 - 1.030   pH, UA 6.0 5.0 - 7.5   Color, UA Yellow Yellow   Appearance Ur Clear Clear   Leukocytes, UA Trace (A) Negative   Protein, UA Negative Negative/Trace   Glucose, UA Negative Negative   Ketones, UA Negative Negative   RBC, UA Negative Negative   Bilirubin, UA Negative Negative   Urobilinogen, Ur 0.2 0.2 - 1.0 mg/dL   Nitrite, UA Negative Negative   Microscopic Examination See below:       Assessment & Plan:   Problem List Items Addressed This Visit    None    Visit Diagnoses    Right arm cellulitis    -  Primary   Tx with bactrim, epsom salt soaks, topical antibiotic ointment. Return precautions reviewed. Recheck in 1 week       Follow up plan: Return in about 1 week (around  12/15/2018) for Cellulitis f/u.

## 2018-12-15 ENCOUNTER — Ambulatory Visit: Payer: Medicare HMO | Admitting: Family Medicine

## 2019-01-24 DIAGNOSIS — Z859 Personal history of malignant neoplasm, unspecified: Secondary | ICD-10-CM | POA: Diagnosis not present

## 2019-01-24 DIAGNOSIS — L720 Epidermal cyst: Secondary | ICD-10-CM | POA: Diagnosis not present

## 2019-01-24 DIAGNOSIS — Z872 Personal history of diseases of the skin and subcutaneous tissue: Secondary | ICD-10-CM | POA: Diagnosis not present

## 2019-01-24 DIAGNOSIS — L578 Other skin changes due to chronic exposure to nonionizing radiation: Secondary | ICD-10-CM | POA: Diagnosis not present

## 2019-01-24 DIAGNOSIS — B372 Candidiasis of skin and nail: Secondary | ICD-10-CM | POA: Diagnosis not present

## 2019-01-24 DIAGNOSIS — L570 Actinic keratosis: Secondary | ICD-10-CM | POA: Diagnosis not present

## 2019-02-07 ENCOUNTER — Ambulatory Visit: Payer: Medicare HMO

## 2019-02-07 ENCOUNTER — Ambulatory Visit: Payer: Medicare HMO | Admitting: Family Medicine

## 2019-02-09 ENCOUNTER — Telehealth: Payer: Self-pay | Admitting: Family Medicine

## 2019-02-09 NOTE — Telephone Encounter (Unsigned)
Copied from [place] [phone]. Topic: Medicare AWV >> Feb 09, 2019 10:32 AM Sherren Kerns wrote: Spoke with patient's daughter, [name], and asked her to have her Mom, [name] to call me(Lisa Theda Sers), today 02/09/2019 at [phone], so I can get permission to do her AWV by telephone and that per the office Practice Administrator, she can do the office visit virtually (ok by Phone) also.  [name], the daughter, voiced understood. If patient calls back, please give their authorization to do visits telephonically and document this in the Appointment Notes.  Also, please document in Appointment Notes, the phone number the patient wants the office to call for the visits.  Thank you, Janace Hoard, Care Guide [phone].

## 2019-02-10 ENCOUNTER — Encounter: Payer: Self-pay | Admitting: Family Medicine

## 2019-02-10 ENCOUNTER — Ambulatory Visit (INDEPENDENT_AMBULATORY_CARE_PROVIDER_SITE_OTHER): Payer: Medicare HMO | Admitting: Family Medicine

## 2019-02-10 ENCOUNTER — Ambulatory Visit (INDEPENDENT_AMBULATORY_CARE_PROVIDER_SITE_OTHER): Payer: Medicare HMO

## 2019-02-10 ENCOUNTER — Other Ambulatory Visit: Payer: Self-pay

## 2019-02-10 VITALS — BP 132/72 | HR 74 | Temp 97.6°F | Ht 65.0 in | Wt 257.0 lb

## 2019-02-10 DIAGNOSIS — Z1239 Encounter for other screening for malignant neoplasm of breast: Secondary | ICD-10-CM

## 2019-02-10 DIAGNOSIS — Z0000 Encounter for general adult medical examination without abnormal findings: Secondary | ICD-10-CM

## 2019-02-10 DIAGNOSIS — I10 Essential (primary) hypertension: Secondary | ICD-10-CM | POA: Diagnosis not present

## 2019-02-10 DIAGNOSIS — E7800 Pure hypercholesterolemia, unspecified: Secondary | ICD-10-CM

## 2019-02-10 MED ORDER — CYCLOBENZAPRINE HCL 5 MG PO TABS: 5.0000 mg | ORAL_TABLET | Freq: Every day | ORAL | 0 refills | Status: DC

## 2019-02-10 NOTE — Progress Notes (Signed)
Subjective:   Felicia Vargas is a 73 y.o. female who presents for Medicare Annual (Subsequent) preventive examination.  This visit is being conducted via telephone due to the COVID-19 pandemic. This patient has given me verbal consent via telephone to conduct this visit. Some vital signs may be absent or patient reported.  Patient identification: identified by name, DOB, and current address.    Review of Systems:  Cardiac Risk Factors include: advanced age (>19men, >47 women);hypertension;dyslipidemia      Objective:     Vitals: BP 132/72 Comment: patient reported   Pulse 74 Comment: patient reported   Temp 97.6 F (36.4 C) (Oral) Comment: patient report Comment (Src): patient reported   Ht 5\' 5"  (1.651 m) Comment: unable to perform   Wt 257 lb (116.6 kg) Comment: patient reported   BMI 42.77 kg/m   Body mass index is 42.77 kg/m.  Advanced Directives 02/10/2019 02/01/2018 02/02/2017 03/23/2016 01/18/2016  Does Patient Have a Medical Advance Directive? Yes Yes Yes Yes Yes  Type of Advance Directive Living will;Healthcare Power of [place];Living will [place];Living will [place];Living will Living will;Healthcare Power of Attorney  Does patient want to make changes to medical advance directive? - - No - Patient declined - Yes - information given  Copy of [place] in Chart? - No - copy requested No - copy requested - No - copy requested    Tobacco Social History   Tobacco Use  Smoking Status Never Smoker  Smokeless Tobacco Never Used     Counseling given: Not Answered   Clinical Intake:  Pre-visit preparation completed: Yes  Pain : No/denies pain Pain Score: 0-No pain     Nutritional Status: BMI > 30  Obese Nutritional Risks: None Diabetes: No  How often do you need to have someone help you when you read instructions, pamphlets, or other written materials from your doctor or  pharmacy?: 1 - Never What is the last grade level you completed in school?: 12th grade  Interpreter Needed?: No  Information entered by :: Retina Bernardy,LPN   Past Medical History:  Diagnosis Date   Allergy    Arthritis    Cancer ([place])    skin   Diffuse cystic mastopathy 2013   High cholesterol    Hypertension 1990's   Migraine    Migraines    Overactive bladder    Urinary incontinence    Past Surgical History:  Procedure Laterality Date   BREAST BIOPSY Right 05/31/93   neg/lump   COLONOSCOPY  11/2004   Dr. Chauncey [place]   COLONOSCOPY WITH PROPOFOL N/A 10/15/2016   Procedure: COLONOSCOPY WITH PROPOFOL;  Surgeon: Christene Lye, MD;  Location: [facility] ENDOSCOPY;  Service: Endoscopy;  Laterality: N/A;   KIDNEY SURGERY  1998   LITHOTRIPSY  1998   Family History  Problem Relation Age of Onset   Lung cancer Father    Stroke Mother    Hypertension Sister    Arthritis Sister    Dementia Sister    Breast cancer Neg Hx    Social History   Socioeconomic History   Marital status: Widowed    Spouse name: Not on file   Number of children: Not on file   Years of education: Not on file   Highest education level: 12th grade  Occupational History   Occupation: daycare   Social Needs   Financial resource strain: Not hard at all   Food insecurity:  Worry: Never true    Inability: Never true   Transportation needs:    Medical: No    Non-medical: No  Tobacco Use   Smoking status: Never Smoker   Smokeless tobacco: Never Used  Substance and Sexual Activity   Alcohol use: No   Drug use: No   Sexual activity: Never  Lifestyle   Physical activity:    Days per week: 0 days    Minutes per session: 0 min   Stress: Not at all  Relationships   Social connections:    Talks on phone: More than three times a week    Gets together: More than three times a week    Attends religious service: More than 4 times per year    Active member of club or  organization: No    Attends meetings of clubs or organizations: Never    Relationship status: Widowed  Other Topics Concern   Not on file  Social History Narrative   Not on file    Outpatient Encounter Medications as of 02/10/2019  Medication Sig   aspirin 81 MG tablet Take 81 mg by mouth daily.   calcium carbonate (OS-CAL) 600 MG TABS Take 600 mg by mouth daily with breakfast.    Cholecalciferol (VITAMIN D PO) Take 1 capsule by mouth daily.   cyclobenzaprine (FLEXERIL) 5 MG tablet TAKE 1-2 TABLETS (5-10 MG TOTAL) BY MOUTH AT BEDTIME.   docusate sodium (COLACE) 100 MG capsule Take 100 mg by mouth daily.    fluticasone (FLONASE) 50 MCG/ACT nasal spray Place 1 spray into both nostrils daily.   ketoconazole (NIZORAL) 2 % cream APPLY TO AFFECTED AREA TWICE A DAY   lisinopril-hydrochlorothiazide (PRINZIDE,ZESTORETIC) 20-25 MG tablet Take 1 tablet by mouth daily.   loratadine (CLARITIN) 10 MG tablet Take 10 mg by mouth daily.   naproxen sodium (ANAPROX) 220 MG tablet Take 220 mg by mouth every morning.   oxybutynin (DITROPAN) 5 MG tablet TAKE 1 TABLET (5 MG) BY ORAL ROUTE 2 TIMES PER DAY   pravastatin (PRAVACHOL) 20 MG tablet Take 1 tablet (20 mg total) by mouth at bedtime.   TURMERIC PO Take 1 tablet by mouth 2 (two) times daily.    Multiple Vitamin (MULTIVITAMIN) tablet Take 1 tablet by mouth daily.   triamcinolone ointment (KENALOG) 0.5 % Apply 1 application topically 2 (two) times daily. (Patient not taking: Reported on 02/10/2019)   [DISCONTINUED] sulfamethoxazole-trimethoprim (BACTRIM DS,SEPTRA DS) 800-160 MG tablet Take 1 tablet by mouth 2 (two) times daily. (Patient not taking: Reported on 02/10/2019)   No facility-administered encounter medications on file as of 02/10/2019.     Activities of Daily Living In your present state of health, do you have any difficulty performing the following activities: 02/10/2019 08/09/2018  Hearing? N N  Vision? N N  Difficulty  concentrating or making decisions? N N  Walking or climbing stairs? Y N  Comment uses railing  -  Dressing or bathing? N N  Doing errands, shopping? N N  Preparing Food and eating ? N -  Using the Toilet? N -  In the past six months, have you accidently leaked urine? Y -  Comment wears pads  -  Do you have problems with loss of bowel control? N -  Managing your Medications? N -  Managing your Finances? N -  Housekeeping or managing your Housekeeping? N -  Some recent data might be hidden    Patient Care Team: Valerie Roys, DO as PCP - General (Family  Medicine) Rico Junker, RN as Registered Nurse Kem Parkinson, MD (Ophthalmology) Silvano Bilis, PA (Dermatology) Jannet Mantis, MD (Dermatology)    Assessment:   This is a routine wellness examination for Siah.  Exercise Activities and Dietary recommendations Current Exercise Habits: The patient does not participate in regular exercise at present, Exercise limited by: None identified  Goals     DIET - INCREASE WATER INTAKE     Recommend continue drinking at least 6-8 glasses of water a day        Fall Risk: Fall Risk  02/10/2019 12/08/2018 08/09/2018 02/01/2018 02/02/2017  Falls in the past year? 0 0 Yes Yes Yes  Number falls in past yr: - - 1 1 1   Injury with Fall? - - Yes Yes Yes  Comment - - - - stitches in forehead  Risk Factor Category  - - - High Fall Risk -  Risk for fall due to : - - History of fall(s) - -  Follow up - Falls evaluation completed - Falls prevention discussed -    FALL RISK PREVENTION PERTAINING TO THE HOME:  Any stairs in or around the home? Yes  If so, are there any without handrails? No   Home free of loose throw rugs in walkways, pet beds, electrical cords, etc? Yes  Adequate lighting in your home to reduce risk of falls? Yes   ASSISTIVE DEVICES UTILIZED TO PREVENT FALLS:  Life alert? No  Use of a cane, walker or w/c? Yes  cane when walking distances Grab bars in  the bathroom? No  Shower chair or bench in shower? No  Elevated toilet seat or a handicapped toilet? Yes   DME ORDERS:  DME order needed?  No   TIMED UP AND GO:  Was the test performed? No .  Unable to perform   Depression Screen PHQ 2/9 Scores 02/10/2019 08/09/2018 02/01/2018 02/02/2017  PHQ - 2 Score 0 0 0 0  PHQ- 9 Score - 0 - -     Cognitive Function     6CIT Screen 02/10/2019 02/10/2019 02/01/2018 02/02/2017  What Year? 0 points 0 points 0 points 0 points  What month? 0 points 0 points 0 points 0 points  What time? 0 points 0 points 0 points 0 points  Count back from 20 0 points 0 points 0 points 0 points  Months in reverse 0 points 0 points 0 points 0 points  Repeat phrase 0 points 0 points 0 points 4 points  Total Score 0 0 0 4    Immunization History  Administered Date(s) Administered   Influenza, High Dose Seasonal PF 08/25/2016, 08/07/2017, 08/09/2018   Influenza-Unspecified 08/31/2015   Pneumococcal Conjugate-13 01/18/2016   Pneumococcal Polysaccharide-23 02/02/2017   Td 01/18/2016    Qualifies for Shingles Vaccine? Yes  Zostavax completed n/a. Due for Shingrix. Education has been provided regarding the importance of this vaccine. Pt has been advised to call insurance company to determine out of pocket expense. Advised may also receive vaccine at local pharmacy or Health Dept. Verbalized acceptance and understanding.  Tdap:up to date   Flu Vaccine: up to date   Pneumococcal Vaccine: up to date   Screening Tests Health Maintenance  Topic Date Due   MAMMOGRAM  03/15/2020   TETANUS/TDAP  01/17/2026   COLONOSCOPY  10/15/2026   INFLUENZA VACCINE  Completed   DEXA SCAN  Completed   Hepatitis C Screening  Completed   PNA vac Low Risk Adult  Completed    Cancer  Screenings:  Colorectal Screening: Completed 10/15/2016. Repeat every 10 years  Mammogram: Completed 03/15/2018 Repeat every year; ordered  Bone Density: Completed 02/24/2017   Lung  Cancer Screening: (Low Dose CT Chest recommended if Age 41-80 years, 30 pack-year currently smoking OR have quit w/in 15years.) does not qualify.    Additional Screening:  Hepatitis C Screening: does qualify; Completed 02/02/2017  Vision Screening: Recommended annual ophthalmology exams for early detection of glaucoma and other disorders of the eye. Is the patient up to date with their annual eye exam?  Yes  Who is the provider or what is the name of the office in which the pt attends annual eye exams? [facility]   Dental Screening: Recommended annual dental exams for proper oral hygiene  Community Resource Referral:  CRR required this visit?  No       Plan:  I have personally reviewed and addressed the Medicare Annual Wellness questionnaire and have noted the following in the patients chart:  A. Medical and social history B. Use of alcohol, tobacco or illicit drugs  C. Current medications and supplements D. Functional ability and status E.  Nutritional status F.  Physical activity G. Advance directives H. List of other physicians I.  Hospitalizations, surgeries, and ER visits in previous 12 months J.  [place] such as hearing and vision if needed, cognitive and depression L. Referrals and appointments   In addition, I have reviewed and discussed with patient certain preventive protocols, quality metrics, and best practice recommendations. A written personalized care plan for preventive services as well as general preventive health recommendations were provided to patient. Nurse Health Advisor  Signed,    Ko Olina, Caro Hight, [place]  3/33/[id number] Nurse Health Advisor   Nurse Notes: requesting refill on flexeril.  Requesting handicap placard.

## 2019-02-10 NOTE — Progress Notes (Signed)
BP 132/72 (BP Location: Left Arm, Patient Position: Sitting, Cuff Size: Normal)   Pulse 74   Temp 97.6 F (36.4 C) (Oral)   Ht 5\' 5"  (1.651 m)   Wt 257 lb (116.6 kg)   BMI 42.77 kg/m    Subjective:    Patient ID: Felicia Vargas, female    DOB: 02/05/1946, 73 y.o.   MRN: [id number]  HPI: Felicia Vargas is a 73 y.o. female  Chief Complaint  Patient presents with  . Hypertension   HYPERTENSION / [place] Satisfied with current treatment? yes Duration of hypertension: chronic BP monitoring frequency: not checking BP medication side effects: no Past BP meds: lisinopril-HCTZ Duration of hyperlipidemia: chronic Cholesterol medication side effects: no Cholesterol supplements: none Past cholesterol medications: pravastatin Medication compliance: excellent compliance Aspirin: yes Recent stressors: no Recurrent headaches: no Visual changes: no Palpitations: no Dyspnea: no Chest pain: no Lower extremity edema: no Dizzy/lightheaded: no  Relevant past medical, surgical, family and social history reviewed and updated as indicated. Interim medical history since our last visit reviewed. Allergies and medications reviewed and updated.  Review of Systems  Constitutional: Negative.   HENT: Negative.   Respiratory: Negative.   Cardiovascular: Negative.   Musculoskeletal: Negative.   Neurological: Negative.   Psychiatric/Behavioral: Negative.     Per HPI unless specifically indicated above     Objective:    BP 132/72 (BP Location: Left Arm, Patient Position: Sitting, Cuff Size: Normal)   Pulse 74   Temp 97.6 F (36.4 C) (Oral)   Ht 5\' 5"  (1.651 m)   Wt 257 lb (116.6 kg)   BMI 42.77 kg/m   Wt Readings from Last 3 Encounters:  02/10/19 257 lb (116.6 kg)  02/10/19 257 lb (116.6 kg)  12/08/18 254 lb (115.2 kg)    Physical Exam Vitals signs and nursing note reviewed.  Pulmonary:     Effort: Pulmonary effort is normal. No respiratory distress.     Comments:  Speaking in full sentences Neurological:     Mental Status: She is alert and oriented to person, place, and time. Mental status is at baseline.  Psychiatric:        Mood and Affect: Mood normal.        Behavior: Behavior normal.        Thought Content: Thought content normal.        Judgment: Judgment normal.     Results for orders placed or performed in visit on 08/09/18  Microscopic Examination  Result Value Ref Range   WBC, UA 0-5 0 - 5 /hpf   RBC, UA 0-2 0 - 2 /hpf   Epithelial Cells (non renal) 0-10 0 - 10 /hpf   Renal Epithel, UA 0-10 (A) None seen /hpf   Bacteria, UA None seen None seen/Few  CBC with Differential/Platelet  Result Value Ref Range   WBC 4.8 3.4 - 10.8 x10E3/uL   RBC 3.84 3.77 - 5.28 x10E6/uL   Hemoglobin 12.0 11.1 - 15.9 g/dL   Hematocrit 36.2 34.0 - 46.6 %   MCV 94 79 - 97 fL   MCH 31.3 26.6 - 33.0 pg   MCHC 33.1 31.5 - 35.7 g/dL   RDW 12.8 12.3 - 15.4 %   Platelets 238 150 - 450 x10E3/uL   Neutrophils 57 Not Estab. %   Lymphs 31 Not Estab. %   Monocytes 6 Not Estab. %   Eos 4 Not Estab. %   Basos 2 Not Estab. %   Neutrophils Absolute 2.7 1.4 -  7.0 x10E3/uL   Lymphocytes Absolute 1.5 0.7 - 3.1 x10E3/uL   Monocytes Absolute 0.3 0.1 - 0.9 x10E3/uL   EOS (ABSOLUTE) 0.2 0.0 - 0.4 x10E3/uL   Basophils Absolute 0.1 0.0 - 0.2 x10E3/uL   Immature Granulocytes 0 Not Estab. %   Immature Grans (Abs) 0.0 0.0 - 0.1 x10E3/uL  Comprehensive metabolic panel  Result Value Ref Range   Glucose 91 65 - 99 mg/dL   BUN 23 8 - 27 mg/dL   Creatinine, Ser 0.82 0.57 - 1.00 mg/dL   GFR calc non Af Amer 73 >59 mL/min/1.73   GFR calc Af Amer 84 >59 mL/min/1.73   BUN/Creatinine Ratio 28 12 - 28   Sodium 142 134 - 144 mmol/L   Potassium 4.7 3.5 - 5.2 mmol/L   Chloride 103 96 - 106 mmol/L   CO2 23 20 - 29 mmol/L   Calcium 9.7 8.7 - 10.3 mg/dL   Total Protein 6.3 6.0 - 8.5 g/dL   Albumin 3.9 3.5 - 4.8 g/dL   Globulin, Total 2.4 1.5 - 4.5 g/dL   Albumin/Globulin Ratio  1.6 1.2 - 2.2   Bilirubin Total 0.6 0.0 - 1.2 mg/dL   Alkaline Phosphatase 90 39 - 117 IU/L   AST 20 0 - 40 IU/L   ALT 16 0 - 32 IU/L  Lipid Panel w/o Chol/HDL Ratio  Result Value Ref Range   Cholesterol, Total 145 100 - 199 mg/dL   Triglycerides 52 0 - 149 mg/dL   HDL 50 >39 mg/dL   VLDL Cholesterol Cal 10 5 - 40 mg/dL   LDL Calculated 85 0 - 99 mg/dL  Microalbumin, Urine Waived  Result Value Ref Range   Microalb, Ur Waived 10 0 - 19 mg/L   Creatinine, Urine Waived 50 10 - 300 mg/dL   Microalb/Creat Ratio <30 <30 mg/g  TSH  Result Value Ref Range   TSH 2.710 0.450 - 4.500 uIU/mL  UA/M w/rflx Culture, Routine  Result Value Ref Range   Specific Gravity, UA 1.010 1.005 - 1.030   pH, UA 6.0 5.0 - 7.5   Color, UA Yellow Yellow   Appearance Ur Clear Clear   Leukocytes, UA Trace (A) Negative   Protein, UA Negative Negative/Trace   Glucose, UA Negative Negative   Ketones, UA Negative Negative   RBC, UA Negative Negative   Bilirubin, UA Negative Negative   Urobilinogen, Ur 0.2 0.2 - 1.0 mg/dL   Nitrite, UA Negative Negative   Microscopic Examination See below:       Assessment & Plan:   Problem List Items Addressed This Visit      Cardiovascular and Mediastinum   Hypertension - Primary    Under good control on current regimen. Continue current regimen. Continue to monitor. Call with any concerns. Refills given. Labs given today.       Relevant Orders   Comprehensive metabolic panel     Other   High cholesterol    Under good control on current regimen. Continue current regimen. Continue to monitor. Call with any concerns. Refills given. Labs given today.      Relevant Orders   Comprehensive metabolic panel   Lipid Panel w/o Chol/HDL Ratio       Follow up plan: Return in about 6 months (around 08/13/2019) for Physical.   . This visit was completed via telephone due to the restrictions of the COVID-19 pandemic. All issues as above were discussed and addressed but  no physical exam was performed. If it was felt  that the patient should be evaluated in the office, they were directed there. The patient verbally consented to this visit. Patient was unable to complete an audio/visual visit due to WebEx not available and [facility] policy. Due to the catastrophic nature of the COVID-19 pandemic, this visit was done through audio contact only. . Location of the patient: home . Location of the provider: home . Those involved with this call:  . Provider: Park Liter, DO . CMA: Merilyn Baba, CMA . Front Desk/Registration: Don Perking  . Time spent on call: 22 minutes on the phone discussing health concerns

## 2019-02-10 NOTE — Assessment & Plan Note (Signed)
Under good control on current regimen. Continue current regimen. Continue to monitor. Call with any concerns. Refills given. Labs given today.  

## 2019-02-10 NOTE — Patient Instructions (Addendum)
[name] , Thank you for taking time to come for your Medicare Wellness Visit. I appreciate your ongoing commitment to your health goals. Please review the following plan we discussed and let me know if I can assist you in the future.   Screening recommendations/referrals: Colonoscopy: completed 10/15/2016 Mammogram: completed 03/15/2017 Please call [phone] to schedule your mammogram.  Bone Density: completed 02/24/2017 Recommended yearly ophthalmology/optometry visit for glaucoma screening and checkup Recommended yearly dental visit for hygiene and checkup  Vaccinations: Influenza vaccine: up to date Pneumococcal vaccine: up to date Tdap vaccine: up to date Shingles vaccine: shingrix eligible, check with your insurance company for coverage information    Advanced directives: Please bring a copy of your health care power of attorney and living will to the office at your convenience.  Conditions/risks identified: none  Next appointment: Follow up in one year for your annual wellness exam.    Preventive Care 65 Years and Older, Female Preventive care refers to lifestyle choices and visits with your health care provider that can promote health and wellness. What does preventive care include?  A yearly physical exam. This is also called an annual well check.  Dental exams once or twice a year.  Routine eye exams. Ask your health care provider how often you should have your eyes checked.  Personal lifestyle choices, including:  Daily care of your teeth and gums.  Regular physical activity.  Eating a healthy diet.  Avoiding tobacco and drug use.  Limiting alcohol use.  Practicing safe sex.  Taking low-dose aspirin every day.  Taking vitamin and mineral supplements as recommended by your health care provider. What happens during an annual well check? The services and screenings done by your health care provider during your annual well check will depend on your age,  overall health, lifestyle risk factors, and family history of disease. Counseling  Your health care provider may ask you questions about your:  Alcohol use.  Tobacco use.  Drug use.  Emotional well-being.  Home and relationship well-being.  Sexual activity.  Eating habits.  History of falls.  Memory and ability to understand (cognition).  Work and work Statistician.  Reproductive health. Screening  You may have the following tests or measurements:  Height, weight, and BMI.  Blood pressure.  Lipid and cholesterol levels. These may be checked every 5 years, or more frequently if you are over 63 years old.  Skin check.  Lung cancer screening. You may have this screening every year starting at age 88 if you have a 30-pack-year history of smoking and currently smoke or have quit within the past 15 years.  Fecal occult blood test (FOBT) of the stool. You may have this test every year starting at age 28.  Flexible sigmoidoscopy or colonoscopy. You may have a sigmoidoscopy every 5 years or a colonoscopy every 10 years starting at age 21.  Hepatitis C blood test.  Hepatitis B blood test.  Sexually transmitted disease (STD) testing.  Diabetes screening. This is done by checking your blood sugar (glucose) after you have not eaten for a while (fasting). You may have this done every 1-3 years.  Bone density scan. This is done to screen for osteoporosis. You may have this done starting at age 81.  Mammogram. This may be done every 1-2 years. Talk to your health care provider about how often you should have regular mammograms. Talk with your health care provider about your test results, treatment options, and if necessary, the need for more tests.  Vaccines  Your health care provider may recommend certain vaccines, such as:  Influenza vaccine. This is recommended every year.  Tetanus, diphtheria, and acellular pertussis (Tdap, Td) vaccine. You may need a Td booster every 10  years.  Zoster vaccine. You may need this after age 11.  Pneumococcal 13-valent conjugate (PCV13) vaccine. One dose is recommended after age 54.  Pneumococcal polysaccharide (PPSV23) vaccine. One dose is recommended after age 33. Talk to your health care provider about which screenings and vaccines you need and how often you need them. This information is not intended to replace advice given to you by your health care provider. Make sure you discuss any questions you have with your health care provider. Document Released: 11/30/2015 Document Revised: 07/23/2016 Document Reviewed: 09/04/2015 Elsevier Interactive Patient Education  2017 [employer] Prevention in the Home Falls can cause injuries. They can happen to people of all ages. There are many things you can do to make your home safe and to help prevent falls. What can I do on the outside of my home?  Regularly fix the edges of walkways and driveways and fix any cracks.  Remove anything that might make you trip as you walk through a door, such as a raised step or threshold.  Trim any bushes or trees on the path to your home.  Use bright outdoor lighting.  Clear any walking paths of anything that might make someone trip, such as rocks or tools.  Regularly check to see if handrails are loose or broken. Make sure that both sides of any steps have handrails.  Any raised decks and porches should have guardrails on the edges.  Have any leaves, snow, or ice cleared regularly.  Use sand or salt on walking paths during winter.  Clean up any spills in your garage right away. This includes oil or grease spills. What can I do in the bathroom?  Use night lights.  Install grab bars by the toilet and in the tub and shower. Do not use towel bars as grab bars.  Use non-skid mats or decals in the tub or shower.  If you need to sit down in the shower, use a plastic, non-slip stool.  Keep the floor dry. Clean up any water that  spills on the floor as soon as it happens.  Remove soap buildup in the tub or shower regularly.  Attach bath mats securely with double-sided non-slip rug tape.  Do not have throw rugs and other things on the floor that can make you trip. What can I do in the bedroom?  Use night lights.  Make sure that you have a light by your bed that is easy to reach.  Do not use any sheets or blankets that are too big for your bed. They should not hang down onto the floor.  Have a firm chair that has side arms. You can use this for support while you get dressed.  Do not have throw rugs and other things on the floor that can make you trip. What can I do in the kitchen?  Clean up any spills right away.  Avoid walking on wet floors.  Keep items that you use a lot in easy-to-reach places.  If you need to reach something above you, use a strong step stool that has a grab bar.  Keep electrical cords out of the way.  Do not use floor polish or wax that makes floors slippery. If you must use wax, use non-skid floor wax.  Do not have throw rugs and other things on the floor that can make you trip. What can I do with my stairs?  Do not leave any items on the stairs.  Make sure that there are handrails on both sides of the stairs and use them. Fix handrails that are broken or loose. Make sure that handrails are as long as the stairways.  Check any carpeting to make sure that it is firmly attached to the stairs. Fix any carpet that is loose or worn.  Avoid having throw rugs at the top or bottom of the stairs. If you do have throw rugs, attach them to the floor with carpet tape.  Make sure that you have a light switch at the top of the stairs and the bottom of the stairs. If you do not have them, ask someone to add them for you. What else can I do to help prevent falls?  Wear shoes that:  Do not have high heels.  Have rubber bottoms.  Are comfortable and fit you well.  Are closed at the  toe. Do not wear sandals.  If you use a stepladder:  Make sure that it is fully opened. Do not climb a closed stepladder.  Make sure that both sides of the stepladder are locked into place.  Ask someone to hold it for you, if possible.  Clearly mark and make sure that you can see:  Any grab bars or handrails.  First and last steps.  Where the edge of each step is.  Use tools that help you move around (mobility aids) if they are needed. These include:  Canes.  Walkers.  Scooters.  Crutches.  Turn on the lights when you go into a dark area. Replace any light bulbs as soon as they burn out.  Set up your furniture so you have a clear path. Avoid moving your furniture around.  If any of your floors are uneven, fix them.  If there are any pets around you, be aware of where they are.  Review your medicines with your doctor. Some medicines can make you feel dizzy. This can increase your chance of falling. Ask your doctor what other things that you can do to help prevent falls. This information is not intended to replace advice given to you by your health care provider. Make sure you discuss any questions you have with your health care provider. Document Released: 08/30/2009 Document Revised: 04/10/2016 Document Reviewed: 12/08/2014 Elsevier Interactive Patient Education  2017 [employer].

## 2019-02-14 ENCOUNTER — Other Ambulatory Visit: Payer: Medicare HMO

## 2019-02-14 ENCOUNTER — Other Ambulatory Visit: Payer: Self-pay

## 2019-02-14 DIAGNOSIS — I10 Essential (primary) hypertension: Secondary | ICD-10-CM | POA: Diagnosis not present

## 2019-02-14 DIAGNOSIS — E7800 Pure hypercholesterolemia, unspecified: Secondary | ICD-10-CM | POA: Diagnosis not present

## 2019-02-15 LAB — COMPREHENSIVE METABOLIC PANEL
ALK PHOS: 89 IU/L (ref 39–117)
ALT: 23 IU/L (ref 0–32)
AST: 23 IU/L (ref 0–40)
Albumin/Globulin Ratio: 1.7 (ref 1.2–2.2)
Albumin: 4 g/dL (ref 3.7–4.7)
BUN/Creatinine Ratio: 28 (ref 12–28)
BUN: 22 mg/dL (ref 8–27)
Bilirubin Total: 0.5 mg/dL (ref 0.0–1.2)
CO2: 22 mmol/L (ref 20–29)
Calcium: 9.5 mg/dL (ref 8.7–10.3)
Chloride: 105 mmol/L (ref 96–106)
Creatinine, Ser: 0.78 mg/dL (ref 0.57–1.00)
GFR calc Af Amer: 88 mL/min/{1.73_m2} (ref >59)
GFR calc non Af Amer: 77 mL/min/{1.73_m2} (ref >59)
GLOBULIN, TOTAL: 2.3 g/dL (ref 1.5–4.5)
Glucose: 88 mg/dL (ref 65–99)
Potassium: 4.4 mmol/L (ref 3.5–5.2)
Sodium: 142 mmol/L (ref 134–144)
Total Protein: 6.3 g/dL (ref 6.0–8.5)

## 2019-02-15 LAB — LIPID PANEL W/O CHOL/HDL RATIO
Cholesterol, Total: 150 mg/dL (ref 100–199)
HDL: 47 mg/dL (ref >39)
LDL Calculated: 87 mg/dL (ref 0–99)
Triglycerides: 78 mg/dL (ref 0–149)
VLDL Cholesterol Cal: 16 mg/dL (ref 5–40)

## 2019-04-19 IMAGING — DX DG CERVICAL SPINE COMPLETE 4+V
8 series · 8 of 8 positions shown · non-contrast
Comparison: None.

CLINICAL DATA: Cervical radiculopathy. Neck pain for 2-3 months.
Numbness and tingling into left arm.

EXAM:
CERVICAL SPINE - COMPLETE 4+ VIEW

[c-spine lat]
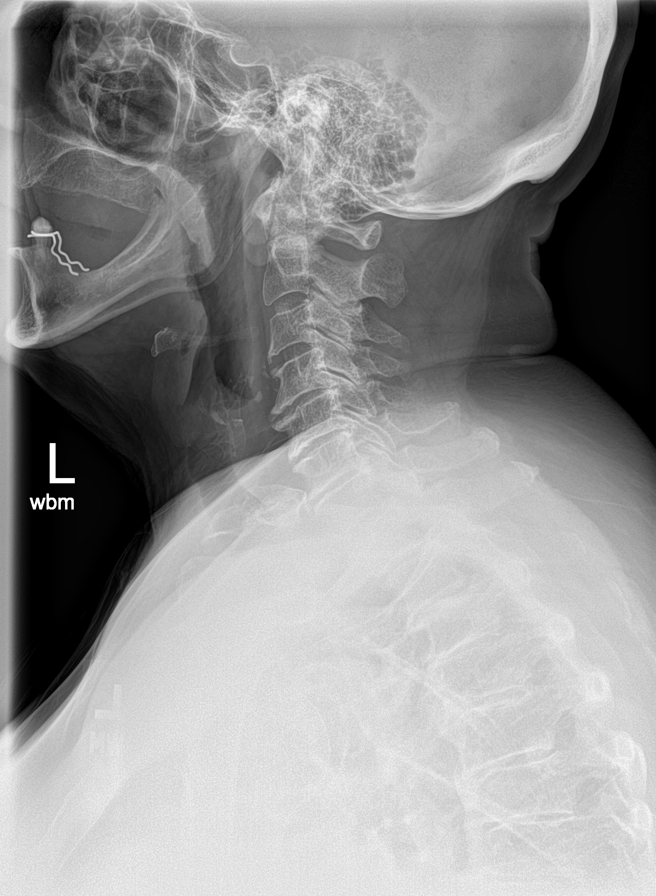

[c-spine obl (1 of 3)]
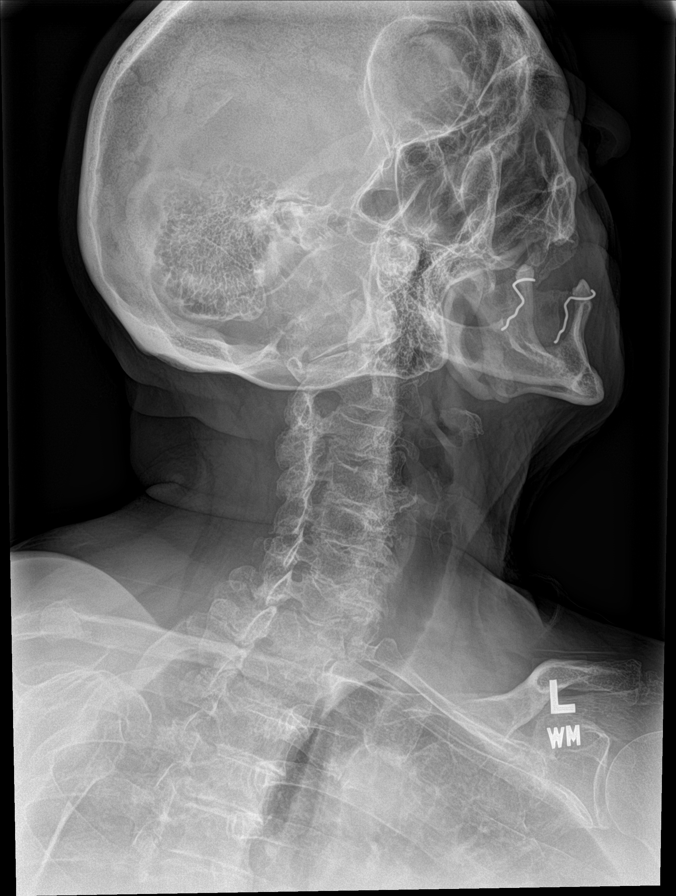

[c-spine obl (2 of 3)]
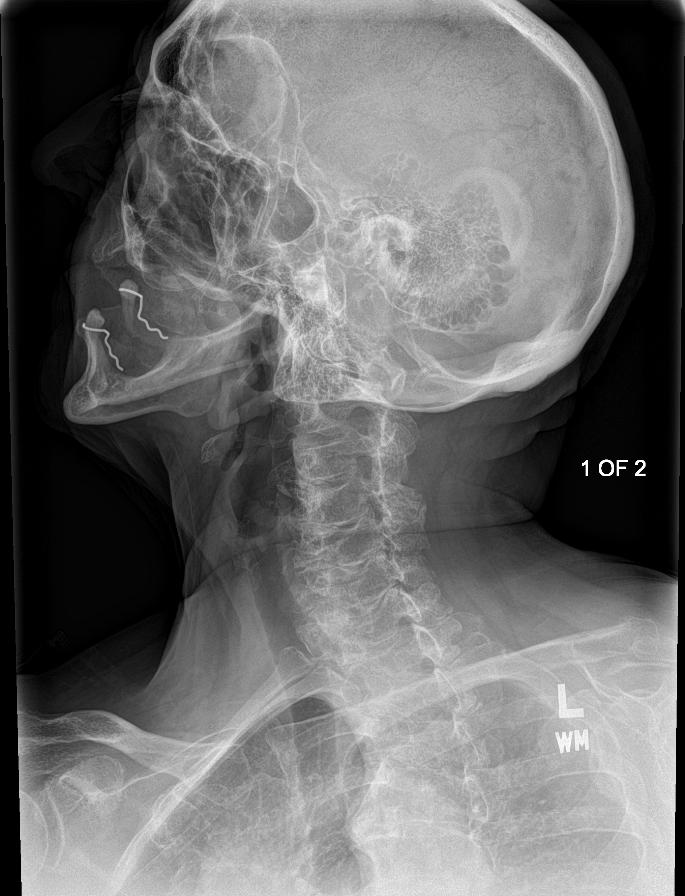

[c-spine ap]
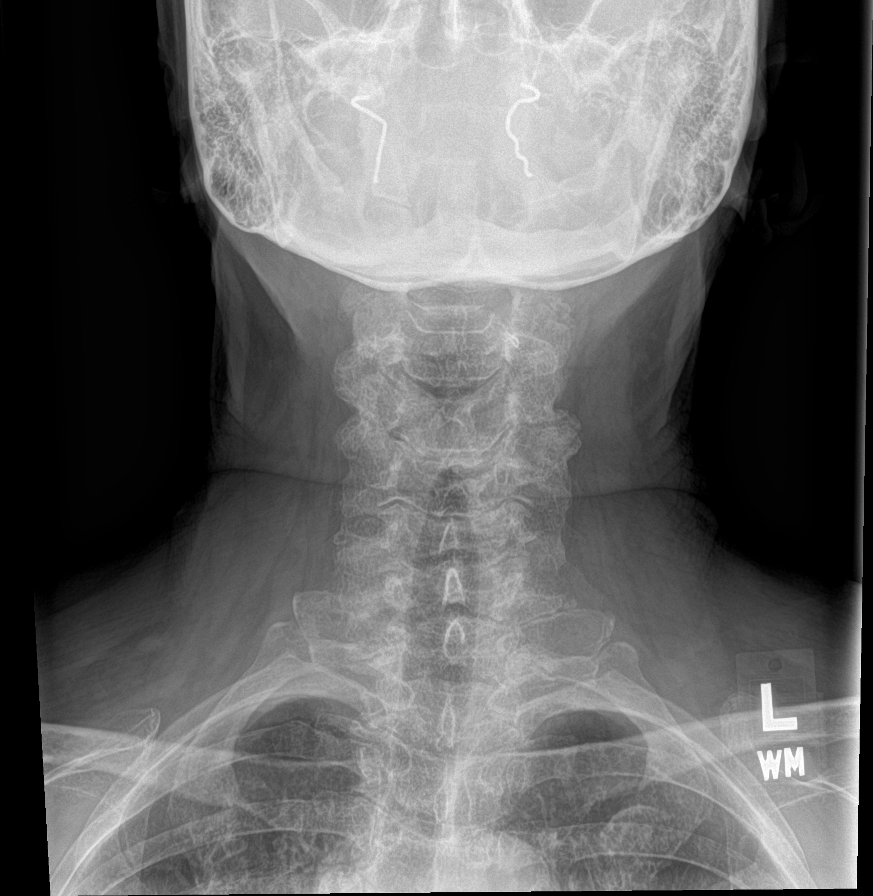

[c-spine open mouth]
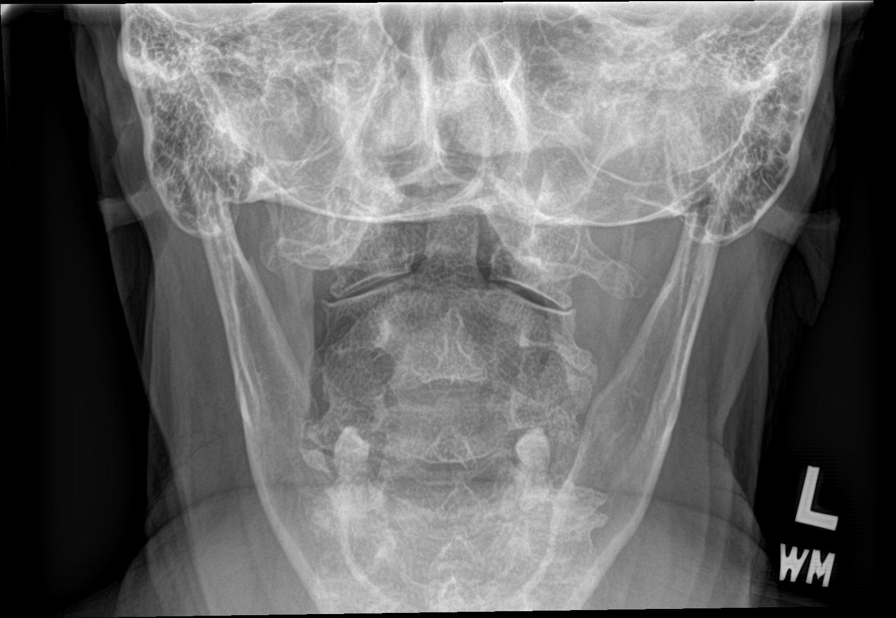

[[person_name]]
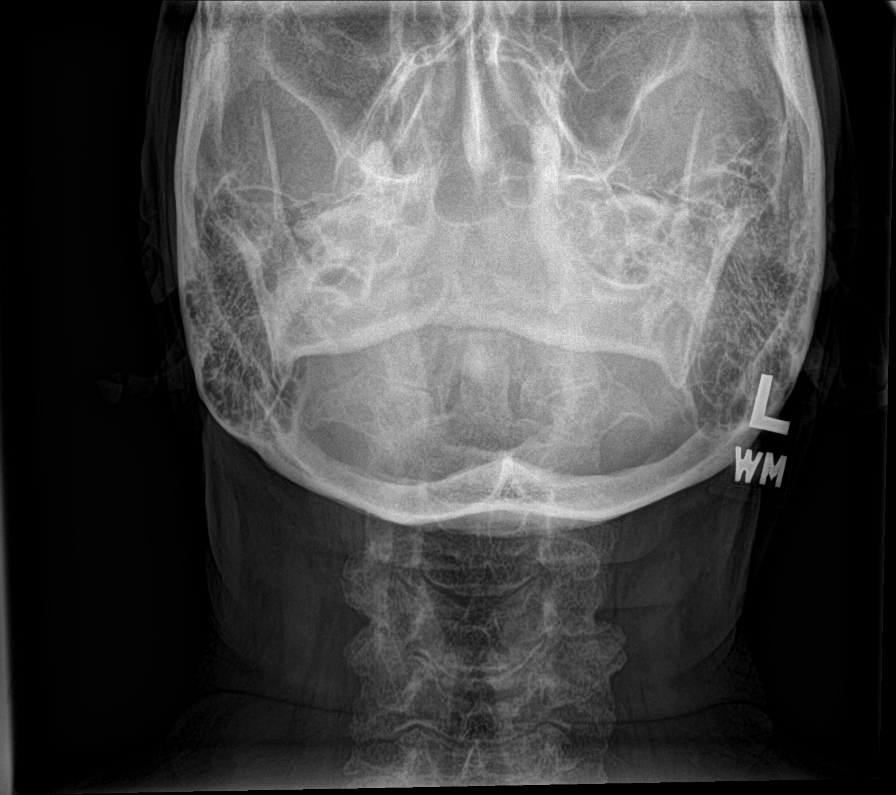

[c-spine swimmers]
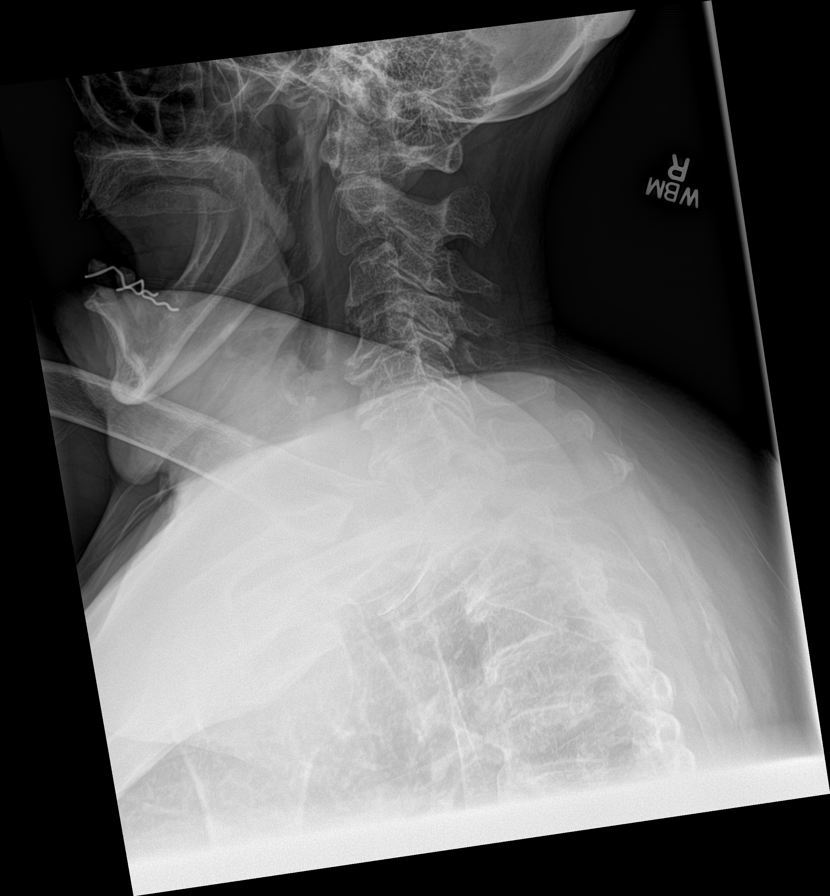

[c-spine obl (3 of 3)]
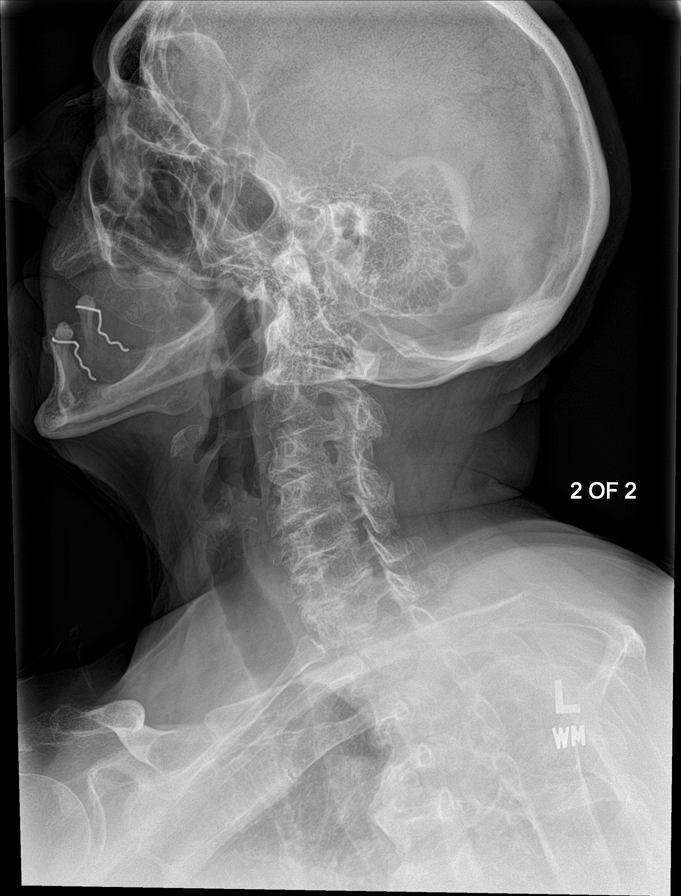

[8 of 8 positions shown; findings below may reference images not displayed]

FINDINGS: The alignment is maintained. No evidence of fracture. The dens is
grossly intact. Narrowing and endplate spurring throughout most
prominent at C4-C5, C5-C6, and C6-C7. There is multilevel facet
arthropathy. Combination of degenerative disc disease and facet
hypertrophy causes bilateral neural foraminal stenosis at multiple
levels. No prevertebral soft tissue edema.
IMPRESSION: 1. Multilevel degenerative disc disease and facet arthropathy
throughout the cervical spine.
2. Multifactorial bilateral neural foraminal stenosis at multiple
levels.
3. No acute osseous abnormality.

## 2019-04-19 IMAGING — DX DG SHOULDER 2+V*L*
4 series · 4 of 4 positions shown · non-contrast
Comparison: None.

CLINICAL DATA: Acute left shoulder pain.  Fall 3 days ago.

EXAM:
LEFT SHOULDER - 2+ VIEW

[shoulder ap]
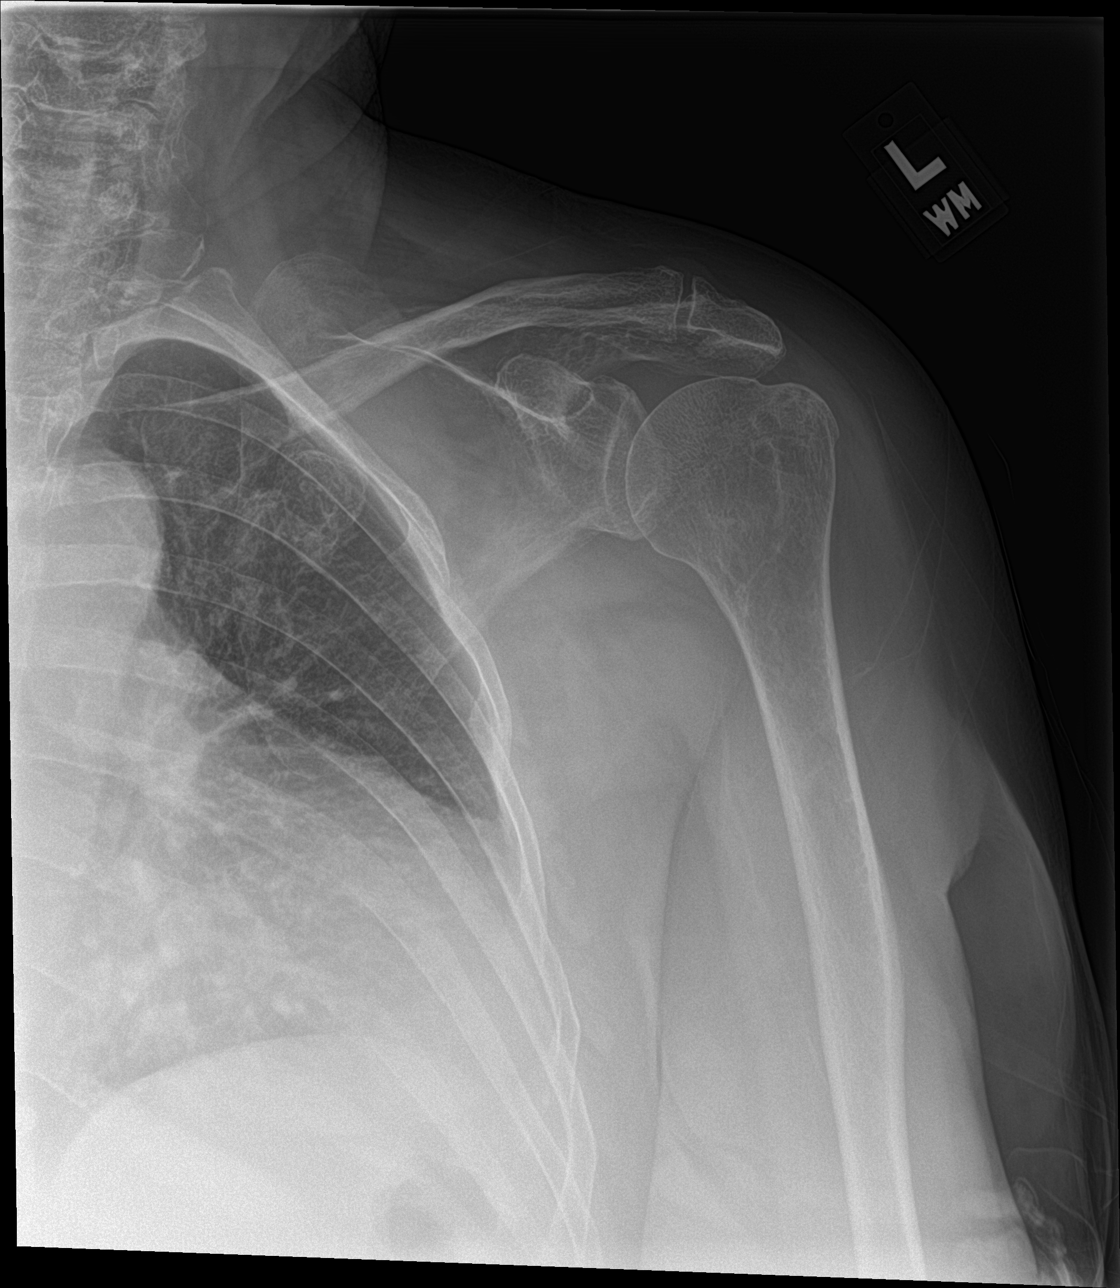

[shoulder y-view]
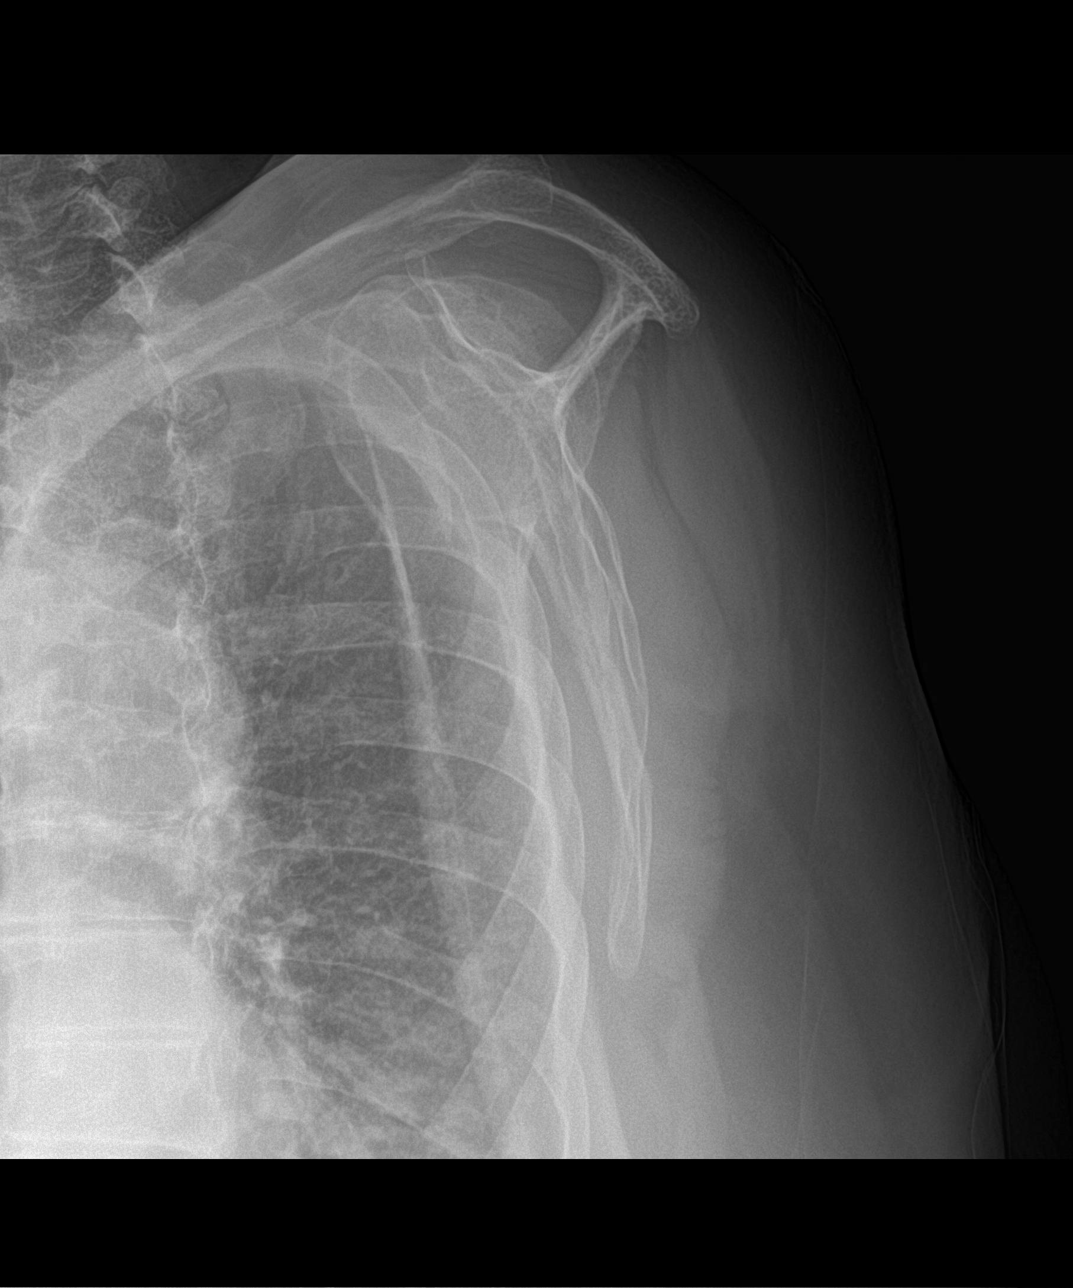

[shoulder axial (1 of 2)]
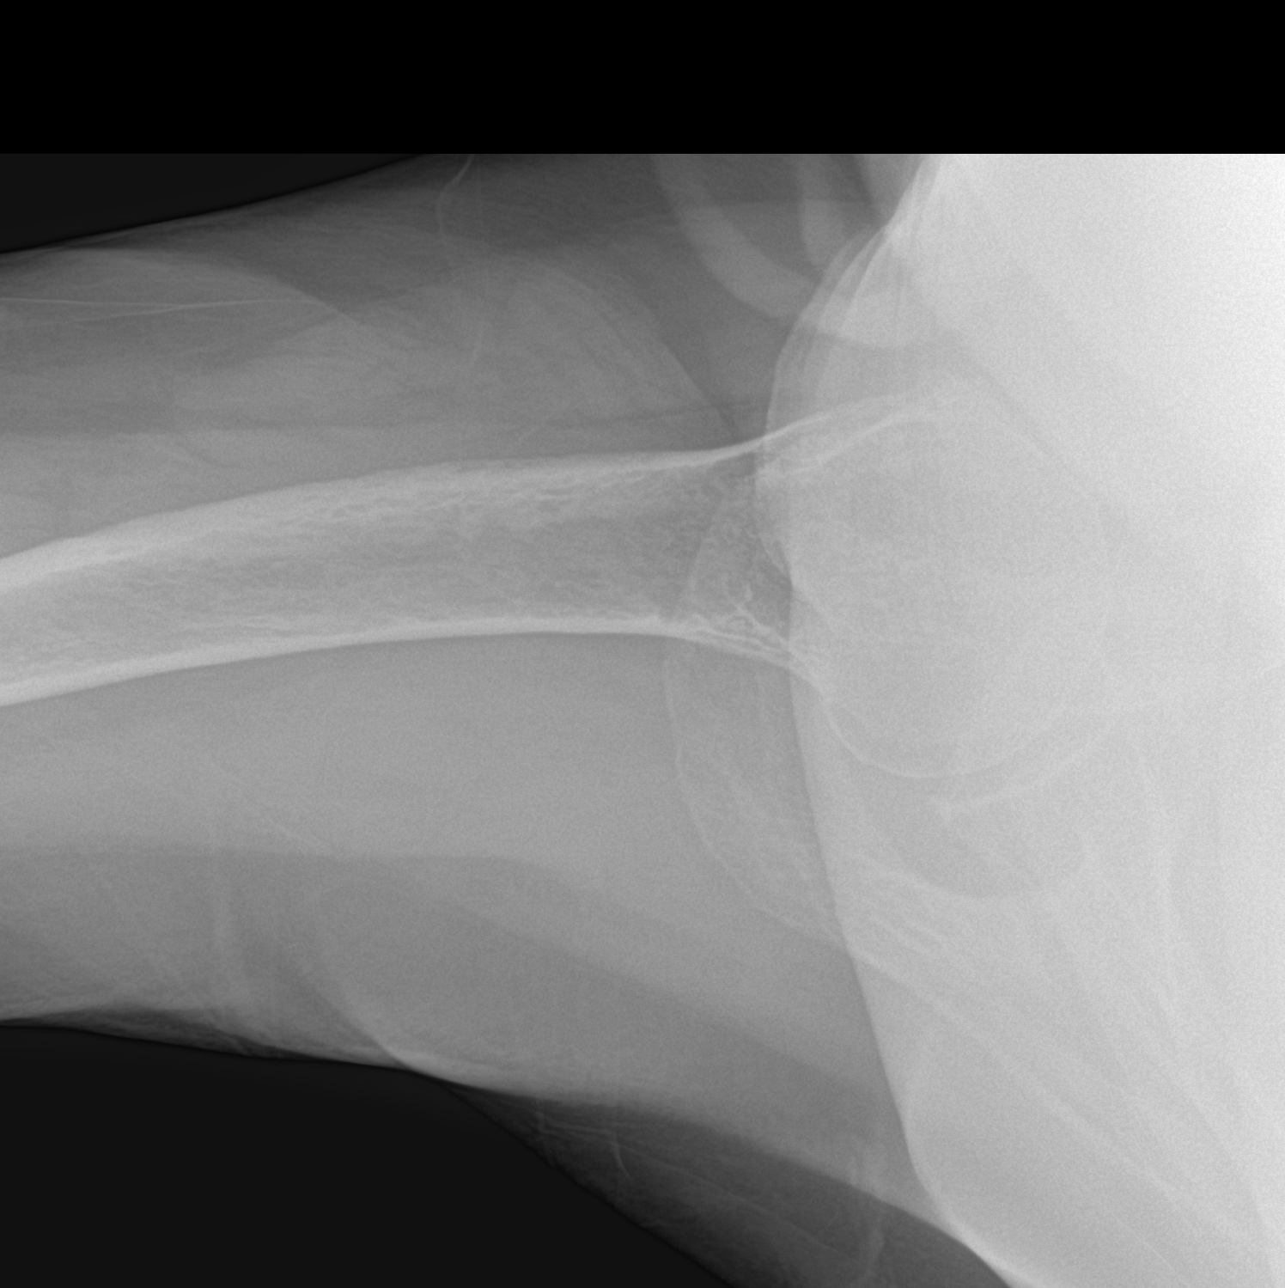

[shoulder axial (2 of 2)]
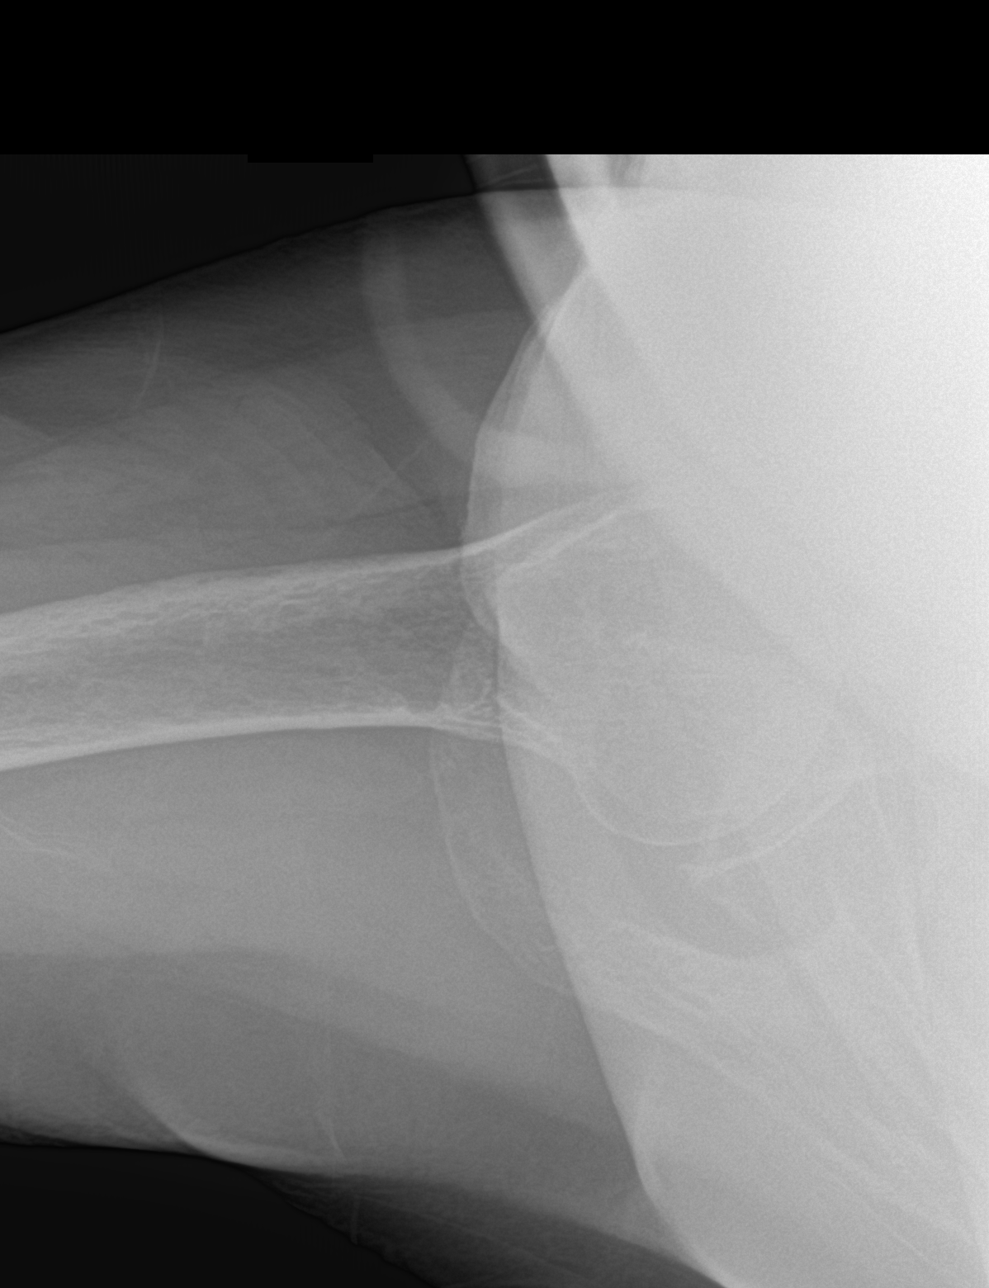

[4 of 4 positions shown; findings below may reference images not displayed]

FINDINGS: There is no evidence of fracture or dislocation. Trace degenerative
acromioclavicular and glenohumeral spurring. Soft tissues are
unremarkable. Visualized left ribs are intact.
IMPRESSION: Minimal osteoarthritis of the glenohumeral and acromioclavicular
joints. No acute osseous abnormality.

## 2019-04-21 ENCOUNTER — Other Ambulatory Visit: Payer: Self-pay

## 2019-04-21 ENCOUNTER — Ambulatory Visit
Admission: RE | Admit: 2019-04-21 | Discharge: 2019-04-21 | Disposition: A | Discharge status: Home / Self Care | Payer: Medicare HMO | Source: Ambulatory Visit | Attending: Family Medicine | Admitting: Family Medicine

## 2019-04-21 DIAGNOSIS — Z1231 Encounter for screening mammogram for malignant neoplasm of breast: Secondary | ICD-10-CM | POA: Diagnosis not present

## 2019-04-21 DIAGNOSIS — Z1239 Encounter for other screening for malignant neoplasm of breast: Secondary | ICD-10-CM

## 2019-08-19 ENCOUNTER — Other Ambulatory Visit: Payer: Self-pay | Admitting: Family Medicine

## 2019-08-19 NOTE — Telephone Encounter (Signed)
Requested medication (s) are due for refill today:yes  Requested medication (s) are on the active medication list: yes  Last refill: 02/10/2019  Future visit scheduled: yes  Notes to clinic: Not delegated    Requested Prescriptions  Pending Prescriptions Disp Refills   cyclobenzaprine (FLEXERIL) 5 MG tablet [Pharmacy Med Name: CYCLOBENZAPRINE 5 MG TABLET] 180 tablet 0    Sig: Take 1-2 tablets (5-10 mg total) by mouth at bedtime.     Not Delegated - Analgesics:  Muscle Relaxants Failed - 08/19/2019  5:06 PM      Failed - This refill cannot be delegated      Failed - Valid encounter within last 6 months    Recent Outpatient Visits          6 months ago Essential hypertension   [place], [place], DO   8 months ago Right arm cellulitis   [facility] [employer], [place]   1 year ago Routine general medical examination at a health care facility   [facility], [place], [place]   1 year ago Essential hypertension   [place], Sherwood P, DO   1 year ago Upper respiratory tract infection, unspecified type   [facility] [employer], [place]      Future Appointments            In 3 days Wynetta Emery, Barb Merino, DO [employer], [facility]

## 2019-08-22 ENCOUNTER — Ambulatory Visit (INDEPENDENT_AMBULATORY_CARE_PROVIDER_SITE_OTHER): Payer: Medicare HMO | Admitting: Family Medicine

## 2019-08-22 ENCOUNTER — Other Ambulatory Visit: Payer: Self-pay

## 2019-08-22 ENCOUNTER — Encounter: Payer: Self-pay | Admitting: Family Medicine

## 2019-08-22 VITALS — BP 134/85 | HR 76 | Temp 98.4°F | Ht 63.5 in | Wt 261.0 lb

## 2019-08-22 DIAGNOSIS — Z23 Encounter for immunization: Secondary | ICD-10-CM | POA: Diagnosis not present

## 2019-08-22 DIAGNOSIS — I10 Essential (primary) hypertension: Secondary | ICD-10-CM

## 2019-08-22 DIAGNOSIS — Z0000 Encounter for general adult medical examination without abnormal findings: Secondary | ICD-10-CM | POA: Diagnosis not present

## 2019-08-22 DIAGNOSIS — E7800 Pure hypercholesterolemia, unspecified: Secondary | ICD-10-CM

## 2019-08-22 DIAGNOSIS — N3281 Overactive bladder: Secondary | ICD-10-CM

## 2019-08-22 LAB — UA/M W/RFLX CULTURE, ROUTINE
Bilirubin, UA: NEGATIVE
Glucose, UA: NEGATIVE
Ketones, UA: NEGATIVE
Leukocytes,UA: NEGATIVE
Nitrite, UA: NEGATIVE
Protein,UA: NEGATIVE
RBC, UA: NEGATIVE
Specific Gravity, UA: 1.02 (ref 1.005–1.030)
Urobilinogen, Ur: 0.2 mg/dL (ref 0.2–1.0)
pH, UA: 5.5 (ref 5.0–7.5)

## 2019-08-22 LAB — MICROALBUMIN, URINE WAIVED
Creatinine, Urine Waived: 50 mg/dL (ref 10–300)
Microalb, Ur Waived: 10 mg/L (ref 0–19)
Microalb/Creat Ratio: <30 mg/g (ref <30)

## 2019-08-22 MED ORDER — CYCLOBENZAPRINE HCL 5 MG PO TABS: 5.0000 mg | ORAL_TABLET | Freq: Every day | ORAL | 0 refills | Status: DC

## 2019-08-22 MED ORDER — OXYBUTYNIN CHLORIDE 5 MG PO TABS: ORAL_TABLET | ORAL | 3 refills | Status: DC

## 2019-08-22 MED ORDER — PRAVASTATIN SODIUM 20 MG PO TABS: 20.0000 mg | ORAL_TABLET | Freq: Every day | ORAL | 1 refills | Status: DC

## 2019-08-22 MED ORDER — LISINOPRIL-HYDROCHLOROTHIAZIDE 20-25 MG PO TABS: 1.0000 | ORAL_TABLET | Freq: Every day | ORAL | 1 refills | Status: DC

## 2019-08-22 MED ORDER — FLUTICASONE PROPIONATE 50 MCG/ACT NA SUSP: 1.0000 | Freq: Every day | NASAL | 12 refills | Status: DC

## 2019-08-22 NOTE — Assessment & Plan Note (Signed)
Under good control on current regimen. Continue current regimen. Continue to monitor. Call with any concerns. Refills given. Labs drawn today.   

## 2019-08-22 NOTE — Progress Notes (Signed)
BP 134/85   Pulse 76   Temp 98.4 F (36.9 C) (Oral)   Ht 5' 3.5" (1.613 m)   Wt 261 lb (118.4 kg)   SpO2 97%   BMI 45.51 kg/m    Subjective:    Patient ID: Felicia Vargas, female    DOB: [birth year], 73 y.o.   MRN: [id number]  HPI: Felicia Vargas is a 73 y.o. female presenting on 08/22/2019 for comprehensive medical examination. Current medical complaints include:  HYPERTENSION / HYPERLIPIDEMIA Satisfied with current treatment? yes Duration of hypertension: chronic BP monitoring frequency: not checking BP medication side effects: no Past BP meds: lisinopril-HCTZ Duration of hyperlipidemia: chronic Cholesterol medication side effects: no Cholesterol supplements: none Past cholesterol medications: pravastatin Medication compliance: excellent compliance Aspirin: yes Recent stressors: yes Recurrent headaches: no Visual changes: no Palpitations: no Dyspnea: no Chest pain: no Lower extremity edema: no Dizzy/lightheaded: no  She currently lives with: her daughter Menopausal Symptoms: no  Depression Screen done today and results listed below:  Depression screen [facility] 2/9 08/22/2019 02/10/2019 08/09/2018 02/01/2018 02/02/2017  Decreased Interest 0 0 0 0 0  Down, Depressed, Hopeless 0 0 0 0 0  PHQ - 2 Score 0 0 0 0 0  Altered sleeping 0 - 0 - -  Tired, decreased energy 0 - 0 - -  Change in appetite 0 - 0 - -  Feeling bad or failure about yourself  0 - 0 - -  Trouble concentrating 0 - 0 - -  Moving slowly or fidgety/restless 0 - 0 - -  Suicidal thoughts 0 - 0 - -  PHQ-9 Score 0 - 0 - -  Difficult doing work/chores Not difficult at all - Not difficult at all - -    Past Medical History:  Past Medical History:  Diagnosis Date  . Allergy   . Arthritis   . Cancer ([place])    skin  . Diffuse cystic mastopathy 2013  . High cholesterol   . Hypertension 1990's  . Migraine   . Migraines   . Overactive bladder   . Urinary incontinence     Surgical History:  Past Surgical  History:  Procedure Laterality Date  . BREAST BIOPSY Right 05/31/93   neg/lump  . COLONOSCOPY  11/2004   Dr. Chauncey Reading  . COLONOSCOPY WITH PROPOFOL N/A 10/15/2016   Procedure: COLONOSCOPY WITH PROPOFOL;  Surgeon: Christene Lye, MD;  Location: [facility] ENDOSCOPY;  Service: Endoscopy;  Laterality: N/A;  . KIDNEY SURGERY  1998  . LITHOTRIPSY  1998    Medications:  Current Outpatient Medications on File Prior to Visit  Medication Sig  . aspirin 81 MG tablet Take 81 mg by mouth daily.  . calcium carbonate (OS-CAL) 600 MG TABS Take 600 mg by mouth daily with breakfast.   . Cholecalciferol (VITAMIN D PO) Take 1 capsule by mouth daily.  Marland Kitchen docusate sodium (COLACE) 100 MG capsule Take 100 mg by mouth daily.   Marland Kitchen loratadine (CLARITIN) 10 MG tablet Take 10 mg by mouth daily.  Marland Kitchen NAPROXEN PO Take by mouth daily.  . TURMERIC PO Take 1 tablet by mouth 2 (two) times daily.    No current facility-administered medications on file prior to visit.     Allergies:  No Known Allergies  Social History:  Social History   Socioeconomic History  . Marital status: Widowed    Spouse name: Not on file  . Number of children: Not on file  . Years of education: Not on file  .  Highest education level: 12th grade  Occupational History  . Occupation: daycare   Social Needs  . Financial resource strain: Not hard at all  . Food insecurity    Worry: Never true    Inability: Never true  . Transportation needs    Medical: No    Non-medical: No  Tobacco Use  . Smoking status: Never Smoker  . Smokeless tobacco: Never Used  Substance and Sexual Activity  . Alcohol use: No  . Drug use: No  . Sexual activity: Never  Lifestyle  . Physical activity    Days per week: 0 days    Minutes per session: 0 min  . Stress: Not at all  Relationships  . Social connections    Talks on phone: More than three times a week    Gets together: More than three times a week    Attends religious service: More than 4 times  per year    Active member of club or organization: No    Attends meetings of clubs or organizations: Never    Relationship status: Widowed  . Intimate partner violence    Fear of current or ex partner: No    Emotionally abused: No    Physically abused: No    Forced sexual activity: No  Other Topics Concern  . Not on file  Social History Narrative  . Not on file   Social History   Tobacco Use  Smoking Status Never Smoker  Smokeless Tobacco Never Used   Social History   Substance and Sexual Activity  Alcohol Use No    Family History:  Family History  Problem Relation Age of Onset  . Lung cancer Father   . Stroke Mother   . Hypertension Sister   . Arthritis Sister   . Dementia Sister   . Breast cancer Neg Hx     Past medical history, surgical history, medications, allergies, family history and social history reviewed with patient today and changes made to appropriate areas of the chart.   Review of Systems  Constitutional: Negative.   HENT: Negative.   Eyes: Negative.   Respiratory: Negative.   Cardiovascular: Positive for leg swelling (when she's on them a lot). Negative for chest pain, palpitations, orthopnea, claudication and PND.  Gastrointestinal: Negative.   Genitourinary: Negative.   Musculoskeletal: Negative.   Skin: Negative.   Neurological: Negative.   Endo/Heme/Allergies: Negative for environmental allergies and polydipsia. Bruises/bleeds easily.  Psychiatric/Behavioral: Negative.     All other ROS negative except what is listed above and in the HPI.      Objective:    BP 134/85   Pulse 76   Temp 98.4 F (36.9 C) (Oral)   Ht 5' 3.5" (1.613 m)   Wt 261 lb (118.4 kg)   SpO2 97%   BMI 45.51 kg/m   Wt Readings from Last 3 Encounters:  08/22/19 261 lb (118.4 kg)  02/10/19 257 lb (116.6 kg)  02/10/19 257 lb (116.6 kg)    Physical Exam Vitals signs and nursing note reviewed.  Constitutional:      General: She is not in acute distress.     Appearance: Normal appearance. She is not ill-appearing, toxic-appearing or diaphoretic.  HENT:     Head: Normocephalic and atraumatic.     Right Ear: Tympanic membrane, ear canal and external ear normal. There is no impacted cerumen.     Left Ear: Tympanic membrane, ear canal and external ear normal. There is no impacted cerumen.  Nose: Nose normal. No congestion or rhinorrhea.     Mouth/Throat:     Mouth: Mucous membranes are moist.     Pharynx: Oropharynx is clear. No oropharyngeal exudate or posterior oropharyngeal erythema.  Eyes:     General: No scleral icterus.       Right eye: No discharge.        Left eye: No discharge.     Extraocular Movements: Extraocular movements intact.     Conjunctiva/sclera: Conjunctivae normal.     Pupils: Pupils are equal, round, and reactive to light.  Neck:     Musculoskeletal: Normal range of motion and neck supple. No neck rigidity or muscular tenderness.     Vascular: No carotid bruit.  Cardiovascular:     Rate and Rhythm: Normal rate and regular rhythm.     Pulses: Normal pulses.     Heart sounds: No murmur. No friction rub. No gallop.   Pulmonary:     Effort: Pulmonary effort is normal. No respiratory distress.     Breath sounds: Normal breath sounds. No stridor. No wheezing, rhonchi or rales.  Chest:     Chest wall: No tenderness.  Abdominal:     General: Abdomen is flat. Bowel sounds are normal. There is no distension.     Palpations: Abdomen is soft. There is no mass.     Tenderness: There is no abdominal tenderness. There is no right CVA tenderness, left CVA tenderness, guarding or rebound.     Hernia: No hernia is present.  Genitourinary:    Comments: Breast and pelvic exams deferred with shared decision making Musculoskeletal:        General: No swelling, tenderness, deformity or signs of injury.     Right lower leg: No edema.     Left lower leg: No edema.  Lymphadenopathy:     Cervical: No cervical adenopathy.  Skin:     General: Skin is warm and dry.     Capillary Refill: Capillary refill takes less than 2 seconds.     Coloration: Skin is not jaundiced or pale.     Findings: No bruising, erythema, lesion or rash.  Neurological:     General: No focal deficit present.     Mental Status: She is alert and oriented to person, place, and time. Mental status is at baseline.     Cranial Nerves: No cranial nerve deficit.     Sensory: No sensory deficit.     Motor: No weakness.     Coordination: Coordination normal.     Gait: Gait normal.     Deep Tendon Reflexes: Reflexes normal.  Psychiatric:        Mood and Affect: Mood normal.        Behavior: Behavior normal.        Thought Content: Thought content normal.        Judgment: Judgment normal.     Results for orders placed or performed in visit on 02/14/19  Lipid Panel w/o Chol/HDL Ratio  Result Value Ref Range   Cholesterol, Total 150 100 - 199 mg/dL   Triglycerides 78 0 - 149 mg/dL   HDL 47 >39 mg/dL   VLDL Cholesterol Cal 16 5 - 40 mg/dL   LDL Calculated 87 0 - 99 mg/dL  Comprehensive metabolic panel  Result Value Ref Range   Glucose 88 65 - 99 mg/dL   BUN 22 8 - 27 mg/dL   Creatinine, Ser 0.78 0.57 - 1.00 mg/dL   GFR calc non Af  Amer 77 >59 mL/min/1.73   GFR calc Af Amer 88 >59 mL/min/1.73   BUN/Creatinine Ratio 28 12 - 28   Sodium 142 134 - 144 mmol/L   Potassium 4.4 3.5 - 5.2 mmol/L   Chloride 105 96 - 106 mmol/L   CO2 22 20 - 29 mmol/L   Calcium 9.5 8.7 - 10.3 mg/dL   Total Protein 6.3 6.0 - 8.5 g/dL   Albumin 4.0 3.7 - 4.7 g/dL   Globulin, Total 2.3 1.5 - 4.5 g/dL   Albumin/Globulin Ratio 1.7 1.2 - 2.2   Bilirubin Total 0.5 0.0 - 1.2 mg/dL   Alkaline Phosphatase 89 39 - 117 IU/L   AST 23 0 - 40 IU/L   ALT 23 0 - 32 IU/L      Assessment & Plan:   Problem List Items Addressed This Visit      Cardiovascular and Mediastinum   Hypertension    Under good control on current regimen. Continue current regimen. Continue to monitor.  Call with any concerns. Refills given. Labs drawn today.        Relevant Medications   lisinopril-hydrochlorothiazide (ZESTORETIC) 20-25 MG tablet   pravastatin (PRAVACHOL) 20 MG tablet   Other Relevant Orders   CBC with Differential/Platelet   Comprehensive metabolic panel   Microalbumin, Urine Waived   TSH     Genitourinary   OAB (overactive bladder)    Under good control on current regimen. Continue current regimen. Continue to monitor. Call with any concerns. Refills given. Labs drawn today.        Relevant Orders   CBC with Differential/Platelet   Comprehensive metabolic panel   UA/M w/rflx Culture, Routine     Other   High cholesterol    Under good control on current regimen. Continue current regimen. Continue to monitor. Call with any concerns. Refills given. Labs drawn today.        Relevant Medications   lisinopril-hydrochlorothiazide (ZESTORETIC) 20-25 MG tablet   pravastatin (PRAVACHOL) 20 MG tablet   Other Relevant Orders   CBC with Differential/Platelet   Comprehensive metabolic panel   Lipid Panel w/o Chol/HDL Ratio    Other Visit Diagnoses    Routine general medical examination at a health care facility    -  Primary   Vaccines up to date. Screening labs checked today. Mammogram and colonscopy up to date. Continue diet and exercise. Continue to monitor.    Flu vaccine need       Flu shot given today.    Relevant Orders   Flu Vaccine QUAD High Dose(Fluad) (Completed)       Follow up plan: Return in about 6 months (around 02/20/2020).   LABORATORY TESTING:  - Pap smear: not applicable  IMMUNIZATIONS:   - Tdap: Tetanus vaccination status reviewed: last tetanus booster within 10 years. - Influenza: Administered today - Pneumovax: Up to date - Prevnar: Up to date  SCREENING: -Mammogram: Up to date  - Colonoscopy: Up to date  - Bone Density: Up to date   PATIENT COUNSELING:   Advised to take 1 mg of folate supplement per day if capable of  pregnancy.   Sexuality: Discussed sexually transmitted diseases, partner selection, use of condoms, avoidance of unintended pregnancy  and contraceptive alternatives.   Advised to avoid cigarette smoking.  I discussed with the patient that most people either abstain from alcohol or drink within safe limits (<=14/week and <=4 drinks/occasion for males, <=7/weeks and <= 3 drinks/occasion for females) and that the risk for alcohol  disorders and other health effects rises proportionally with the number of drinks per week and how often a drinker exceeds daily limits.  Discussed cessation/primary prevention of drug use and availability of treatment for abuse.   Diet: Encouraged to adjust caloric intake to maintain  or achieve ideal body weight, to reduce intake of dietary saturated fat and total fat, to limit sodium intake by avoiding high sodium foods and not adding table salt, and to maintain adequate dietary potassium and calcium preferably from fresh fruits, vegetables, and low-fat dairy products.    stressed the importance of regular exercise  Injury prevention: Discussed safety belts, safety helmets, smoke detector, smoking near bedding or upholstery.   Dental health: Discussed importance of regular tooth brushing, flossing, and dental visits.    NEXT PREVENTATIVE PHYSICAL DUE IN 1 YEAR. Return in about 6 months (around 02/20/2020).

## 2019-08-23 LAB — CBC WITH DIFFERENTIAL/PLATELET
Basophils Absolute: 0.1 10*3/uL (ref 0.0–0.2)
Basos: 2 %
EOS (ABSOLUTE): 0.2 10*3/uL (ref 0.0–0.4)
Eos: 3 %
Hematocrit: 36.4 % (ref 34.0–46.6)
Hemoglobin: 12.5 g/dL (ref 11.1–15.9)
Immature Grans (Abs): 0 10*3/uL (ref 0.0–0.1)
Immature Granulocytes: 0 %
Lymphocytes Absolute: 2.1 10*3/uL (ref 0.7–3.1)
Lymphs: 33 %
MCH: 32.6 pg (ref 26.6–33.0)
MCHC: 34.3 g/dL (ref 31.5–35.7)
MCV: 95 fL (ref 79–97)
Monocytes Absolute: 0.6 10*3/uL (ref 0.1–0.9)
Monocytes: 10 %
Neutrophils Absolute: 3.4 10*3/uL (ref 1.4–7.0)
Neutrophils: 52 %
Platelets: 219 10*3/uL (ref 150–450)
RBC: 3.84 x10E6/uL (ref 3.77–5.28)
RDW: 11.9 % (ref 11.7–15.4)
WBC: 6.5 10*3/uL (ref 3.4–10.8)

## 2019-08-23 LAB — COMPREHENSIVE METABOLIC PANEL
ALT: 15 IU/L (ref 0–32)
AST: 20 IU/L (ref 0–40)
Albumin/Globulin Ratio: 1.5 (ref 1.2–2.2)
Albumin: 4.1 g/dL (ref 3.7–4.7)
Alkaline Phosphatase: 85 IU/L (ref 39–117)
BUN/Creatinine Ratio: 26 (ref 12–28)
BUN: 27 mg/dL (ref 8–27)
Bilirubin Total: 0.5 mg/dL (ref 0.0–1.2)
CO2: 22 mmol/L (ref 20–29)
Calcium: 9.8 mg/dL (ref 8.7–10.3)
Chloride: 103 mmol/L (ref 96–106)
Creatinine, Ser: 1.05 mg/dL — ABNORMAL HIGH (ref 0.57–1.00)
GFR calc Af Amer: 62 mL/min/{1.73_m2} (ref >59)
GFR calc non Af Amer: 54 mL/min/{1.73_m2} — ABNORMAL LOW (ref >59)
Globulin, Total: 2.8 g/dL (ref 1.5–4.5)
Glucose: 86 mg/dL (ref 65–99)
Potassium: 4.3 mmol/L (ref 3.5–5.2)
Sodium: 139 mmol/L (ref 134–144)
Total Protein: 6.9 g/dL (ref 6.0–8.5)

## 2019-08-23 LAB — LIPID PANEL W/O CHOL/HDL RATIO
Cholesterol, Total: 155 mg/dL (ref 100–199)
HDL: 47 mg/dL (ref >39)
LDL Chol Calc (NIH): 92 mg/dL (ref 0–99)
Triglycerides: 87 mg/dL (ref 0–149)
VLDL Cholesterol Cal: 16 mg/dL (ref 5–40)

## 2019-08-23 LAB — TSH: TSH: 2.97 u[IU]/mL (ref 0.450–4.500)

## 2019-12-23 ENCOUNTER — Other Ambulatory Visit: Payer: Medicare HMO

## 2020-01-01 ENCOUNTER — Ambulatory Visit: Payer: Medicare HMO | Attending: Internal Medicine

## 2020-01-01 DIAGNOSIS — Z23 Encounter for immunization: Secondary | ICD-10-CM

## 2020-01-01 NOTE — Progress Notes (Signed)
   Covid-19 Vaccination Clinic  Name:  Felicia Vargas    MRN: [id number] DOB: 1946-03-07  01/01/2020  Felicia Vargas was observed post Covid-19 immunization for 15 minutes without incidence. She was provided with Vaccine Information Sheet and instruction to access the V-Safe system.   Felicia Vargas was instructed to call 911 with any severe reactions post vaccine: Marland Kitchen Difficulty breathing  . Swelling of your face and throat  . A fast heartbeat  . A bad rash all over your body  . Dizziness and weakness    Immunizations Administered    Name Date Dose VIS Date Route   Pfizer COVID-19 Vaccine 01/01/2020  9:09 AM 0.3 mL 10/28/2019 Intramuscular   Manufacturer: [place]   Lot: [id number]   [place]: [id number]

## 2020-01-25 ENCOUNTER — Ambulatory Visit: Payer: Medicare HMO | Attending: Internal Medicine

## 2020-01-25 DIAGNOSIS — Z23 Encounter for immunization: Secondary | ICD-10-CM

## 2020-01-25 NOTE — Progress Notes (Signed)
   Covid-19 Vaccination Clinic  Name:  Felicia Vargas    MRN: [id number] DOB: [birth year]  01/25/2020  Felicia Vargas was observed post Covid-19 immunization for 15 minutes without incident. She was provided with Vaccine Information Sheet and instruction to access the V-Safe system.   Felicia Vargas was instructed to call 911 with any severe reactions post vaccine: Marland Kitchen Difficulty breathing  . Swelling of face and throat  . A fast heartbeat  . A bad rash all over body  . Dizziness and weakness   Immunizations Administered    Name Date Dose VIS Date Route   Pfizer COVID-19 Vaccine 01/25/2020  2:12 PM 0.3 mL 10/28/2019 Intramuscular   Manufacturer: [place]   Lot: [id number]   [place]: [id number]

## 2020-01-30 DIAGNOSIS — Z859 Personal history of malignant neoplasm, unspecified: Secondary | ICD-10-CM | POA: Diagnosis not present

## 2020-01-30 DIAGNOSIS — L570 Actinic keratosis: Secondary | ICD-10-CM | POA: Diagnosis not present

## 2020-01-30 DIAGNOSIS — L578 Other skin changes due to chronic exposure to nonionizing radiation: Secondary | ICD-10-CM | POA: Diagnosis not present

## 2020-01-30 DIAGNOSIS — Z872 Personal history of diseases of the skin and subcutaneous tissue: Secondary | ICD-10-CM | POA: Diagnosis not present

## 2020-01-30 DIAGNOSIS — L531 Erythema annulare centrifugum: Secondary | ICD-10-CM | POA: Diagnosis not present

## 2020-02-09 ENCOUNTER — Other Ambulatory Visit: Payer: Self-pay | Admitting: Family Medicine

## 2020-02-13 ENCOUNTER — Ambulatory Visit (INDEPENDENT_AMBULATORY_CARE_PROVIDER_SITE_OTHER): Payer: Medicare HMO

## 2020-02-13 VITALS — Ht 63.5 in | Wt 261.0 lb

## 2020-02-13 DIAGNOSIS — Z0000 Encounter for general adult medical examination without abnormal findings: Secondary | ICD-10-CM | POA: Diagnosis not present

## 2020-02-13 NOTE — Patient Instructions (Signed)
Felicia Vargas , Thank you for taking time to come for your Medicare Wellness Visit. I appreciate your ongoing commitment to your health goals. Please review the following plan we discussed and let me know if I can assist you in the future.   Screening recommendations/referrals: Colonoscopy: completed 2017, due 2027 Mammogram: completed 04/2019 Bone Density: completed  Recommended yearly ophthalmology/optometry visit for glaucoma screening and checkup Recommended yearly dental visit for hygiene and checkup  Vaccinations: Influenza vaccine: up to date  Pneumococcal vaccine: up to date  Tdap vaccine: up to date  Shingles vaccine: shingrix eligible    Covid-19: completed   Advanced directives: Please bring a copy of your health care power of attorney and living will to the office at your convenience..  Conditions/risks identified: none   Next appointment: Follow up in one year for your annual wellness visit.    Preventive Care 72 Years and Older, Female Preventive care refers to lifestyle choices and visits with your health care provider that can promote health and wellness. What does preventive care include?  A yearly physical exam. This is also called an annual well check.  Dental exams once or twice a year.  Routine eye exams. Ask your health care provider how often you should have your eyes checked.  Personal lifestyle choices, including:  Daily care of your teeth and gums.  Regular physical activity.  Eating a healthy diet.  Avoiding tobacco and drug use.  Limiting alcohol use.  Practicing safe sex.  Taking low-dose aspirin every day.  Taking vitamin and mineral supplements as recommended by your health care provider. What happens during an annual well check? The services and screenings done by your health care provider during your annual well check will depend on your age, overall health, lifestyle risk factors, and family history of disease. Counseling  Your  health care provider may ask you questions about your:  Alcohol use.  Tobacco use.  Drug use.  Emotional well-being.  Home and relationship well-being.  Sexual activity.  Eating habits.  History of falls.  Memory and ability to understand (cognition).  Work and work Statistician.  Reproductive health. Screening  You may have the following tests or measurements:  Height, weight, and BMI.  Blood pressure.  Lipid and cholesterol levels. These may be checked every 5 years, or more frequently if you are over 41 years old.  Skin check.  Lung cancer screening. You may have this screening every year starting at age 12 if you have a 30-pack-year history of smoking and currently smoke or have quit within the past 15 years.  Fecal occult blood test (FOBT) of the stool. You may have this test every year starting at age 71.  Flexible sigmoidoscopy or colonoscopy. You may have a sigmoidoscopy every 5 years or a colonoscopy every 10 years starting at age 26.  Hepatitis C blood test.  Hepatitis B blood test.  Sexually transmitted disease (STD) testing.  Diabetes screening. This is done by checking your blood sugar (glucose) after you have not eaten for a while (fasting). You may have this done every 1-3 years.  Bone density scan. This is done to screen for osteoporosis. You may have this done starting at age 49.  Mammogram. This may be done every 1-2 years. Talk to your health care provider about how often you should have regular mammograms. Talk with your health care provider about your test results, treatment options, and if necessary, the need for more tests. Vaccines  Your health care provider  may recommend certain vaccines, such as:  Influenza vaccine. This is recommended every year.  Tetanus, diphtheria, and acellular pertussis (Tdap, Td) vaccine. You may need a Td booster every 10 years.  Zoster vaccine. You may need this after age 72.  Pneumococcal 13-valent  conjugate (PCV13) vaccine. One dose is recommended after age 33.  Pneumococcal polysaccharide (PPSV23) vaccine. One dose is recommended after age 71. Talk to your health care provider about which screenings and vaccines you need and how often you need them. This information is not intended to replace advice given to you by your health care provider. Make sure you discuss any questions you have with your health care provider. Document Released: 11/30/2015 Document Revised: 07/23/2016 Document Reviewed: 09/04/2015 Elsevier Interactive Patient Education  2017 [place] Prevention in the Home Falls can cause injuries. They can happen to people of all ages. There are many things you can do to make your home safe and to help prevent falls. What can I do on the outside of my home?  Regularly fix the edges of walkways and driveways and fix any cracks.  Remove anything that might make you trip as you walk through a door, such as a raised step or threshold.  Trim any bushes or trees on the path to your home.  Use bright outdoor lighting.  Clear any walking paths of anything that might make someone trip, such as rocks or tools.  Regularly check to see if handrails are loose or broken. Make sure that both sides of any steps have handrails.  Any raised decks and porches should have guardrails on the edges.  Have any leaves, snow, or ice cleared regularly.  Use sand or salt on walking paths during winter.  Clean up any spills in your garage right away. This includes oil or grease spills. What can I do in the bathroom?  Use night lights.  Install grab bars by the toilet and in the tub and shower. Do not use towel bars as grab bars.  Use non-skid mats or decals in the tub or shower.  If you need to sit down in the shower, use a plastic, non-slip stool.  Keep the floor dry. Clean up any water that spills on the floor as soon as it happens.  Remove soap buildup in the tub or  shower regularly.  Attach bath mats securely with double-sided non-slip rug tape.  Do not have throw rugs and other things on the floor that can make you trip. What can I do in the bedroom?  Use night lights.  Make sure that you have a light by your bed that is easy to reach.  Do not use any sheets or blankets that are too big for your bed. They should not hang down onto the floor.  Have a firm chair that has side arms. You can use this for support while you get dressed.  Do not have throw rugs and other things on the floor that can make you trip. What can I do in the kitchen?  Clean up any spills right away.  Avoid walking on wet floors.  Keep items that you use a lot in easy-to-reach places.  If you need to reach something above you, use a strong step stool that has a grab bar.  Keep electrical cords out of the way.  Do not use floor polish or wax that makes floors slippery. If you must use wax, use non-skid floor wax.  Do not have throw rugs  and other things on the floor that can make you trip. What can I do with my stairs?  Do not leave any items on the stairs.  Make sure that there are handrails on both sides of the stairs and use them. Fix handrails that are broken or loose. Make sure that handrails are as long as the stairways.  Check any carpeting to make sure that it is firmly attached to the stairs. Fix any carpet that is loose or worn.  Avoid having throw rugs at the top or bottom of the stairs. If you do have throw rugs, attach them to the floor with carpet tape.  Make sure that you have a light switch at the top of the stairs and the bottom of the stairs. If you do not have them, ask someone to add them for you. What else can I do to help prevent falls?  Wear shoes that:  Do not have high heels.  Have rubber bottoms.  Are comfortable and fit you well.  Are closed at the toe. Do not wear sandals.  If you use a stepladder:  Make sure that it is fully  opened. Do not climb a closed stepladder.  Make sure that both sides of the stepladder are locked into place.  Ask someone to hold it for you, if possible.  Clearly mark and make sure that you can see:  Any grab bars or handrails.  First and last steps.  Where the edge of each step is.  Use tools that help you move around (mobility aids) if they are needed. These include:  Canes.  Walkers.  Scooters.  Crutches.  Turn on the lights when you go into a dark area. Replace any light bulbs as soon as they burn out.  Set up your furniture so you have a clear path. Avoid moving your furniture around.  If any of your floors are uneven, fix them.  If there are any pets around you, be aware of where they are.  Review your medicines with your doctor. Some medicines can make you feel dizzy. This can increase your chance of falling. Ask your doctor what other things that you can do to help prevent falls. This information is not intended to replace advice given to you by your health care provider. Make sure you discuss any questions you have with your health care provider. Document Released: 08/30/2009 Document Revised: 04/10/2016 Document Reviewed: 12/08/2014 Elsevier Interactive Patient Education  2017 [employer].

## 2020-02-13 NOTE — Progress Notes (Signed)
Subjective:   KYLAR FAHL is a 74 y.o. female who presents for Medicare Annual (Subsequent) preventive examination.  This visit is being conducted via phone call  - after an attmept to do on video chat - due to the COVID-19 pandemic. This patient has given me verbal consent via phone to conduct this visit, patient states they are participating from their home address. Some vital signs may be absent or patient reported.   Patient identification: identified by name, DOB, and current address.    Review of Systems:   Cardiac Risk Factors include: advanced age (>23men, >70 women);dyslipidemia;hypertension     Objective:     Vitals: Ht 5' 3.5" (1.613 m)   Wt 261 lb (118.4 kg)   BMI 45.51 kg/m   Body mass index is 45.51 kg/m.  Advanced Directives 02/13/2020 02/10/2019 02/01/2018 02/02/2017 03/23/2016 01/18/2016  Does Patient Have a Medical Advance Directive? Yes Yes Yes Yes Yes Yes  Type of Advance Directive Living will;Healthcare Power of Attorney Living will;Healthcare Power of [place];Living will [place];Living will [place];Living will Living will;Healthcare Power of Attorney  Does patient want to make changes to medical advance directive? - - - No - Patient declined - Yes - information given  Copy of [place] in Chart? No - copy requested - No - copy requested No - copy requested - No - copy requested    Tobacco Social History   Tobacco Use  Smoking Status Never Smoker  Smokeless Tobacco Never Used     Counseling given: Not Answered   Clinical Intake:  Pre-visit preparation completed: Yes  Pain : No/denies pain     Nutritional Status: BMI > 30  Obese Nutritional Risks: None Diabetes: No  How often do you need to have someone help you when you read instructions, pamphlets, or other written materials from your doctor or pharmacy?: 1 - Never  Interpreter Needed?:  No  Information entered by :: Shunte Senseney,LPN  Past Medical History:  Diagnosis Date  . Allergy   . Arthritis   . Cancer ([place])    skin  . Diffuse cystic mastopathy 2013  . High cholesterol   . Hypertension 1990's  . Migraine   . Migraines   . Overactive bladder   . Urinary incontinence    Past Surgical History:  Procedure Laterality Date  . BREAST BIOPSY Right 05/31/93   neg/lump  . COLONOSCOPY  11/2004   Dr. Chauncey [place]  . COLONOSCOPY WITH PROPOFOL N/A 10/15/2016   Procedure: COLONOSCOPY WITH PROPOFOL;  Surgeon: Christene Lye, MD;  Location: [facility] ENDOSCOPY;  Service: Endoscopy;  Laterality: N/A;  . KIDNEY SURGERY  1998  . LITHOTRIPSY  1998   Family History  Problem Relation Age of Onset  . Lung cancer Father   . Stroke Mother   . Hypertension Sister   . Arthritis Sister   . Dementia Sister   . Breast cancer Neg Hx    Social History   Socioeconomic History  . Marital status: Widowed    Spouse name: Not on file  . Number of children: Not on file  . Years of education: Not on file  . Highest education level: 12th grade  Occupational History  . Occupation: retired  Tobacco Use  . Smoking status: Never Smoker  . Smokeless tobacco: Never Used  Substance and Sexual Activity  . Alcohol use: No  . Drug use: No  . Sexual activity: Never  Other Topics  Concern  . Not on file  Social History Narrative  . Not on file   Social Determinants of Health   Financial Resource Strain: Low Risk   . Difficulty of Paying Living Expenses: Not hard at all  Food Insecurity: No Food Insecurity  . Worried About Charity fundraiser in the Last Year: Never true  . Ran Out of Food in the Last Year: Never true  Transportation Needs: No Transportation Needs  . Lack of Transportation (Medical): No  . Lack of Transportation (Non-Medical): No  Physical Activity: Inactive  . Days of Exercise per Week: 0 days  . Minutes of Exercise per Session: 0 min  Stress:   . Feeling of  Stress :   Social Connections: Slightly Isolated  . Frequency of Communication with Friends and Family: More than three times a week  . Frequency of Social Gatherings with Friends and Family: More than three times a week  . Attends Religious Services: More than 4 times per year  . Active Member of Clubs or Organizations: Yes  . Attends Archivist Meetings: More than 4 times per year  . Marital Status: Widowed    Outpatient Encounter Medications as of 02/13/2020  Medication Sig  . aspirin 81 MG tablet Take 81 mg by mouth daily.  . calcium carbonate (OS-CAL) 600 MG TABS Take 600 mg by mouth daily with breakfast.   . Cholecalciferol (VITAMIN D PO) Take 1 capsule by mouth daily.  . cyclobenzaprine (FLEXERIL) 5 MG tablet Take 1-2 tablets (5-10 mg total) by mouth at bedtime.  . docusate sodium (COLACE) 100 MG capsule Take 100 mg by mouth daily.   . fluticasone (FLONASE) 50 MCG/ACT nasal spray Place 1 spray into both nostrils daily.  Marland Kitchen lisinopril-hydrochlorothiazide (ZESTORETIC) 20-25 MG tablet TAKE 1 TABLET BY MOUTH EVERY DAY  . loratadine (CLARITIN) 10 MG tablet Take 10 mg by mouth daily.  Marland Kitchen NAPROXEN PO Take by mouth daily.  Marland Kitchen oxybutynin (DITROPAN) 5 MG tablet TAKE 1 TABLET (5 MG) BY ORAL ROUTE 2 TIMES PER DAY  . pravastatin (PRAVACHOL) 20 MG tablet TAKE 1 TABLET BY MOUTH EVERYDAY AT BEDTIME  . TURMERIC PO Take 1 tablet by mouth 2 (two) times daily.    No facility-administered encounter medications on file as of 02/13/2020.    Activities of Daily Living In your present state of health, do you have any difficulty performing the following activities: 02/13/2020 08/22/2019  Hearing? N N  Comment no hearing aids -  Vision? N N  Comment eyeglasses, [facility] -  Difficulty concentrating or making decisions? N N  Walking or climbing stairs? N Y  Comment take time -  Dressing or bathing? N N  Doing errands, shopping? N N  Preparing Food and eating ? N -  Using the Toilet? N -   In the past six months, have you accidently leaked urine? N -  Do you have problems with loss of bowel control? N -  Managing your Medications? N -  Managing your Finances? N -  Housekeeping or managing your Housekeeping? N -  Some recent data might be hidden    Patient Care Team: Valerie Roys, DO as PCP - General (Family Medicine) Rico Junker, RN as Registered Nurse Kem Parkinson, MD (Ophthalmology) Silvano Bilis, PA (Dermatology) Jannet Mantis, MD (Dermatology)    Assessment:   This is a routine wellness examination for Dhruvi.  Exercise Activities and Dietary recommendations Current Exercise Habits: The patient does  not participate in regular exercise at present, Exercise limited by: None identified  Goals Addressed   None     Fall Risk: Fall Risk  02/10/2019 12/08/2018 08/09/2018 02/01/2018 02/02/2017  Falls in the past year? 0 0 Yes Yes Yes  Number falls in past yr: - - 1 1 1   Injury with Fall? - - Yes Yes Yes  Comment - - - - stitches in forehead  Risk Factor Category  - - - High Fall Risk -  Risk for fall due to : - - History of fall(s) - -  Follow up - Falls evaluation completed - Falls prevention discussed -    FALL RISK PREVENTION PERTAINING TO THE HOME:  Any stairs in or around the home? Yes  If so, are there any without handrails? No   Home free of loose throw rugs in walkways, pet beds, electrical cords, etc? Yes  Adequate lighting in your home to reduce risk of falls? Yes   ASSISTIVE DEVICES UTILIZED TO PREVENT FALLS:  Life alert? No  Use of a cane, walker or w/c? Yes  cane as needed for long walks Grab bars in the bathroom? No  Shower chair or bench in shower? No  Elevated toilet seat or a handicapped toilet? No   DME ORDERS:  DME order needed?  No   TIMED UP AND GO:  Unable to perform   Depression Screen PHQ 2/9 Scores 02/13/2020 08/22/2019 02/10/2019 08/09/2018  PHQ - 2 Score 0 0 0 0  PHQ- 9 Score - 0 - 0      Cognitive Function     6CIT Screen 02/10/2019 02/10/2019 02/01/2018 02/02/2017  What Year? 0 points 0 points 0 points 0 points  What month? 0 points 0 points 0 points 0 points  What time? 0 points 0 points 0 points 0 points  Count back from 20 0 points 0 points 0 points 0 points  Months in reverse 0 points 0 points 0 points 0 points  Repeat phrase 0 points 0 points 0 points 4 points  Total Score 0 0 0 4    Immunization History  Administered Date(s) Administered  . Fluad Quad(high Dose 65+) 08/22/2019  . Influenza, High Dose Seasonal PF 08/25/2016, 08/07/2017, 08/09/2018  . Influenza-Unspecified 08/31/2015  . PFIZER SARS-COV-2 Vaccination 01/01/2020, 01/25/2020  . Pneumococcal Conjugate-13 01/18/2016  . Pneumococcal Polysaccharide-23 02/02/2017  . Td 01/18/2016    Qualifies for Shingles Vaccine? No  Zostavax completed n/a. Due for Shingrix. Education has been provided regarding the importance of this vaccine. Pt has been advised to call insurance company to determine out of pocket expense. Advised may also receive vaccine at local pharmacy or Health Dept. Verbalized acceptance and understanding.  Tdap: up to date   Flu Vaccine: up to date   Pneumococcal Vaccine:   Covid-19 Vaccine: Completed vaccines  Screening Tests Health Maintenance  Topic Date Due  . MAMMOGRAM  04/20/2021  . TETANUS/TDAP  01/17/2026  . COLONOSCOPY  10/15/2026  . INFLUENZA VACCINE  Completed  . DEXA SCAN  Completed  . Hepatitis C Screening  Completed  . PNA vac Low Risk Adult  Completed    Cancer Screenings:  Colorectal Screening: Completed 2017. Repeat every 10 years.  Mammogram: Completed 04/21/2019. Repeat every year  Bone Density: Completed 2018.   Lung Cancer Screening: (Low Dose CT Chest recommended if Age 24-80 years, 30 pack-year currently smoking OR have quit w/in 15years.) does not qualify.     Additional Screening:  Hepatitis C Screening: does  qualify; Completed 2018  Vision  Screening: Recommended annual ophthalmology exams for early detection of glaucoma and other disorders of the eye. Is the patient up to date with their annual eye exam?  Yes  Who is the provider or what is the name of the office in which the pt attends annual eye exams?[facility]   Dental Screening: Recommended annual dental exams for proper oral hygiene  Community Resource Referral:  CRR required this visit?  No       Plan:  I have personally reviewed and addressed the Medicare Annual Wellness questionnaire and have noted the following in the patient's chart:  A. Medical and social history B. Use of alcohol, tobacco or illicit drugs  C. Current medications and supplements D. Functional ability and status E.  Nutritional status F.  Physical activity G. Advance directives H. List of other physicians I.  Hospitalizations, surgeries, and ER visits in previous 12 months J.  [place] such as hearing and vision if needed, cognitive and depression L. Referrals and appointments   In addition, I have reviewed and discussed with patient certain preventive protocols, quality metrics, and best practice recommendations. A written personalized care plan for preventive services as well as general preventive health recommendations were provided to patient.  Signed,    Bevelyn Ngo, LPN  [id number] Nurse Health Advisor   Nurse Notes: none

## 2020-02-21 ENCOUNTER — Encounter: Payer: Self-pay | Admitting: Family Medicine

## 2020-02-21 ENCOUNTER — Other Ambulatory Visit: Payer: Self-pay

## 2020-02-21 ENCOUNTER — Ambulatory Visit (INDEPENDENT_AMBULATORY_CARE_PROVIDER_SITE_OTHER): Payer: Medicare HMO | Admitting: Family Medicine

## 2020-02-21 VITALS — BP 128/78 | HR 75 | Temp 97.5°F | Ht 64.37 in | Wt 267.4 lb

## 2020-02-21 DIAGNOSIS — N3281 Overactive bladder: Secondary | ICD-10-CM | POA: Diagnosis not present

## 2020-02-21 DIAGNOSIS — M62838 Other muscle spasm: Secondary | ICD-10-CM

## 2020-02-21 DIAGNOSIS — E7800 Pure hypercholesterolemia, unspecified: Secondary | ICD-10-CM | POA: Diagnosis not present

## 2020-02-21 DIAGNOSIS — I10 Essential (primary) hypertension: Secondary | ICD-10-CM | POA: Diagnosis not present

## 2020-02-21 LAB — UA/M W/RFLX CULTURE, ROUTINE
Bilirubin, UA: NEGATIVE
Glucose, UA: NEGATIVE
Ketones, UA: NEGATIVE
Leukocytes,UA: NEGATIVE
Nitrite, UA: NEGATIVE
Protein,UA: NEGATIVE
RBC, UA: NEGATIVE
Specific Gravity, UA: <1.005 — ABNORMAL LOW (ref 1.005–1.030)
Urobilinogen, Ur: 0.2 mg/dL (ref 0.2–1.0)
pH, UA: 5.5 (ref 5.0–7.5)

## 2020-02-21 MED ORDER — PRAVASTATIN SODIUM 20 MG PO TABS: ORAL_TABLET | ORAL | 1 refills | Status: DC

## 2020-02-21 MED ORDER — OXYBUTYNIN CHLORIDE 5 MG PO TABS: ORAL_TABLET | ORAL | 3 refills | Status: DC

## 2020-02-21 MED ORDER — CYCLOBENZAPRINE HCL 5 MG PO TABS: 5.0000 mg | ORAL_TABLET | Freq: Every day | ORAL | 0 refills | Status: DC

## 2020-02-21 MED ORDER — FLUTICASONE PROPIONATE 50 MCG/ACT NA SUSP: 1.0000 | Freq: Every day | NASAL | 3 refills | Status: DC

## 2020-02-21 MED ORDER — LISINOPRIL-HYDROCHLOROTHIAZIDE 20-25 MG PO TABS: 1.0000 | ORAL_TABLET | Freq: Every day | ORAL | 1 refills | Status: DC

## 2020-02-21 NOTE — Assessment & Plan Note (Signed)
Under good control on current regimen. Continue current regimen. Continue to monitor. Call with any concerns. Refills given. Labs drawn today.   

## 2020-02-21 NOTE — Progress Notes (Signed)
BP 128/78 (BP Location: Left Arm, Cuff Size: Normal)   Pulse 75   Temp (!) 97.5 F (36.4 C) (Oral)   Ht 5' 4.37" (1.635 m)   Wt 267 lb 6.4 oz (121.3 kg)   SpO2 97%   BMI 45.37 kg/m    Subjective:    Patient ID: Felicia Vargas, female    DOB: June 07, 1946, 74 y.o.   MRN: [id number]  HPI: Felicia Vargas is a 74 y.o. female  Chief Complaint  Patient presents with  . Arthritis    shoulder  . Hypertension  . Over Active Bladder  . Hyperlipidemia   HYPERTENSION / HYPERLIPIDEMIA Satisfied with current treatment? yes Duration of hypertension: chronic BP monitoring frequency: not checking BP medication side effects: no Past BP meds: lisinopril, hctz Duration of hyperlipidemia: chronic Cholesterol medication side effects: no Cholesterol supplements: none Past cholesterol medications: pravastatin Medication compliance: excellent compliance Aspirin: yes Recent stressors: yes Recurrent headaches: no Visual changes: no Palpitations: no Dyspnea: no Chest pain: no Lower extremity edema: no Dizzy/lightheaded: no  Relevant past medical, surgical, family and social history reviewed and updated as indicated. Interim medical history since our last visit reviewed. Allergies and medications reviewed and updated.  Review of Systems  Constitutional: Negative.   Respiratory: Negative.   Cardiovascular: Negative.   Gastrointestinal: Negative.   Musculoskeletal: Positive for arthralgias and myalgias. Negative for back pain, gait problem, joint swelling, neck pain and neck stiffness.  Skin: Negative.   Neurological: Negative.   Psychiatric/Behavioral: Negative.     Per HPI unless specifically indicated above     Objective:    BP 128/78 (BP Location: Left Arm, Cuff Size: Normal)   Pulse 75   Temp (!) 97.5 F (36.4 C) (Oral)   Ht 5' 4.37" (1.635 m)   Wt 267 lb 6.4 oz (121.3 kg)   SpO2 97%   BMI 45.37 kg/m   Wt Readings from Last 3 Encounters:  02/21/20 267 lb 6.4 oz  (121.3 kg)  02/13/20 261 lb (118.4 kg)  08/22/19 261 lb (118.4 kg)    Physical Exam Vitals and nursing note reviewed.  Constitutional:      General: She is not in acute distress.    Appearance: Normal appearance. She is not ill-appearing, toxic-appearing or diaphoretic.  HENT:     Head: Normocephalic and atraumatic.     Right Ear: External ear normal.     Left Ear: External ear normal.     Nose: Nose normal.     Mouth/Throat:     Mouth: Mucous membranes are moist.     Pharynx: Oropharynx is clear.  Eyes:     General: No scleral icterus.       Right eye: No discharge.        Left eye: No discharge.     Extraocular Movements: Extraocular movements intact.     Conjunctiva/sclera: Conjunctivae normal.     Pupils: Pupils are equal, round, and reactive to light.  Cardiovascular:     Rate and Rhythm: Normal rate and regular rhythm.     Pulses: Normal pulses.     Heart sounds: Normal heart sounds. No murmur. No friction rub. No gallop.   Pulmonary:     Effort: Pulmonary effort is normal. No respiratory distress.     Breath sounds: Normal breath sounds. No stridor. No wheezing, rhonchi or rales.  Chest:     Chest wall: No tenderness.  Musculoskeletal:        General: Normal range of motion.  Cervical back: Normal range of motion and neck supple.  Skin:    General: Skin is warm and dry.     Capillary Refill: Capillary refill takes less than 2 seconds.     Coloration: Skin is not jaundiced or pale.     Findings: No bruising, erythema, lesion or rash.  Neurological:     General: No focal deficit present.     Mental Status: She is alert and oriented to person, place, and time. Mental status is at baseline.  Psychiatric:        Mood and Affect: Mood normal.        Behavior: Behavior normal.        Thought Content: Thought content normal.        Judgment: Judgment normal.     Results for orders placed or performed in visit on 08/22/19  CBC with Differential/Platelet  Result  Value Ref Range   WBC 6.5 3.4 - 10.8 x10E3/uL   RBC 3.84 3.77 - 5.28 x10E6/uL   Hemoglobin 12.5 11.1 - 15.9 g/dL   Hematocrit 36.4 34.0 - 46.6 %   MCV 95 79 - 97 fL   MCH 32.6 26.6 - 33.0 pg   MCHC 34.3 31.5 - 35.7 g/dL   RDW 11.9 11.7 - 15.4 %   Platelets 219 150 - 450 x10E3/uL   Neutrophils 52 Not Estab. %   Lymphs 33 Not Estab. %   Monocytes 10 Not Estab. %   Eos 3 Not Estab. %   Basos 2 Not Estab. %   Neutrophils Absolute 3.4 1.4 - 7.0 x10E3/uL   Lymphocytes Absolute 2.1 0.7 - 3.1 x10E3/uL   Monocytes Absolute 0.6 0.1 - 0.9 x10E3/uL   EOS (ABSOLUTE) 0.2 0.0 - 0.4 x10E3/uL   Basophils Absolute 0.1 0.0 - 0.2 x10E3/uL   Immature Granulocytes 0 Not Estab. %   Immature Grans (Abs) 0.0 0.0 - 0.1 x10E3/uL  Comprehensive metabolic panel  Result Value Ref Range   Glucose 86 65 - 99 mg/dL   BUN 27 8 - 27 mg/dL   Creatinine, Ser 1.05 (H) 0.57 - 1.00 mg/dL   GFR calc non Af Amer 54 (L) >59 mL/min/1.73   GFR calc Af Amer 62 >59 mL/min/1.73   BUN/Creatinine Ratio 26 12 - 28   Sodium 139 134 - 144 mmol/L   Potassium 4.3 3.5 - 5.2 mmol/L   Chloride 103 96 - 106 mmol/L   CO2 22 20 - 29 mmol/L   Calcium 9.8 8.7 - 10.3 mg/dL   Total Protein 6.9 6.0 - 8.5 g/dL   Albumin 4.1 3.7 - 4.7 g/dL   Globulin, Total 2.8 1.5 - 4.5 g/dL   Albumin/Globulin Ratio 1.5 1.2 - 2.2   Bilirubin Total 0.5 0.0 - 1.2 mg/dL   Alkaline Phosphatase 85 39 - 117 IU/L   AST 20 0 - 40 IU/L   ALT 15 0 - 32 IU/L  Lipid Panel w/o Chol/HDL Ratio  Result Value Ref Range   Cholesterol, Total 155 100 - 199 mg/dL   Triglycerides 87 0 - 149 mg/dL   HDL 47 >39 mg/dL   VLDL Cholesterol Cal 16 5 - 40 mg/dL   LDL Chol Calc (NIH) 92 0 - 99 mg/dL  Microalbumin, Urine Waived  Result Value Ref Range   Microalb, Ur Waived 10 0 - 19 mg/L   Creatinine, Urine Waived 50 10 - 300 mg/dL   Microalb/Creat Ratio <30 <30 mg/g  TSH  Result Value Ref Range   TSH 2.970  0.450 - 4.500 uIU/mL  UA/M w/rflx Culture, Routine   Specimen:  Urine   URINE  Result Value Ref Range   Specific Gravity, UA 1.020 1.005 - 1.030   pH, UA 5.5 5.0 - 7.5   Color, UA Yellow Yellow   Appearance Ur Clear Clear   Leukocytes,UA Negative Negative   Protein,UA Negative Negative/Trace   Glucose, UA Negative Negative   Ketones, UA Negative Negative   RBC, UA Negative Negative   Bilirubin, UA Negative Negative   Urobilinogen, Ur 0.2 0.2 - 1.0 mg/dL   Nitrite, UA Negative Negative      Assessment & Plan:   Problem List Items Addressed This Visit      Cardiovascular and Mediastinum   Hypertension - Primary    Under good control on current regimen. Continue current regimen. Continue to monitor. Call with any concerns. Refills given. Labs drawn today.       Relevant Medications   pravastatin (PRAVACHOL) 20 MG tablet   lisinopril-hydrochlorothiazide (ZESTORETIC) 20-25 MG tablet   Other Relevant Orders   Comprehensive metabolic panel     Genitourinary   OAB (overactive bladder)    Under good control on current regimen. Continue current regimen. Continue to monitor. Call with any concerns. Refills given. Labs drawn today.       Relevant Orders   Comprehensive metabolic panel   UA/M w/rflx Culture, Routine     Other   High cholesterol    Under good control on current regimen. Continue current regimen. Continue to monitor. Call with any concerns. Refills given. Labs drawn today.       Relevant Medications   pravastatin (PRAVACHOL) 20 MG tablet   lisinopril-hydrochlorothiazide (ZESTORETIC) 20-25 MG tablet   Other Relevant Orders   Comprehensive metabolic panel   Lipid Panel w/o Chol/HDL Ratio    Other Visit Diagnoses    Trapezius muscle spasm       Will treat with exercises and flexeril and heat. Call if not getting better or getting worse.        Follow up plan: Return in about 6 months (around 08/22/2020) for physical.

## 2020-02-21 NOTE — Patient Instructions (Signed)
[facility] Ask your health care provider which exercises are safe for you. Do exercises exactly as told by your health care provider and adjust them as directed. It is normal to feel mild stretching, pulling, tightness, or discomfort as you do these exercises. Stop right away if you feel sudden pain or your pain gets worse. Do not begin these exercises until told by your health care provider. Stretching and range-of-motion exercises These exercises warm up your muscles and joints and improve the movement and flexibility of your shoulder. These exercises can also help to relieve pain, numbness, and tingling. Shoulder flexion, standing  1. Stand and hold a broomstick, a cane, or a similar object. Place your hands a little more than shoulder width apart on the object. Your left / right hand should be palm-up, and your other hand should be palm-down. 2. Keep your elbow straight and your shoulder muscles relaxed. Push the stick up with your healthy arm to raise your left / right arm in front of your body, and then over your head until you feel a stretch in your shoulder (flexion). ? Avoid shrugging your shoulder while you raise your arm. Keep your shoulder blade tucked down toward your spine. 3. Hold for __________ seconds. 4. Slowly return to the starting position. Repeat __________ times. Complete this exercise __________ times a day. Shoulder abduction, active-assisted You will need a stick, broom handle, or similar object to help you (assist) in doing this exercise. 1. Lie on your back. This is the supine position. Hold a broomstick, a cane, or a similar object. 2. Place your hands a little more than shoulder width apart on the object. Your left / right hand should be palm-up, and your other hand should be palm-down. 3. Keeping your shoulder relaxed, push the stick to raise your left / right arm out to your side (abduction) and then over your head. Use your other hand to help move the  stick. Stop when you feel a stretch in your shoulder, or when you reach the angle that is recommended by your health care provider. ? Avoid shrugging your shoulder while you raise your arm. Keep your shoulder blade tucked down toward the middle of your back. 4. Hold for __________ seconds. 5. Slowly return to the starting position. Repeat __________ times. Complete this exercise __________ times a day. Shoulder flexion, active-assisted  1. Lie on your back. You may bend your knees for comfort. 2. Hold a broomstick, a cane, or a similar object so your hands are about shoulder width apart. Your palms should face toward your feet. 3. Raise your left / right arm over your head, then behind your head toward the floor (flexion). Use your other hand to help you do this (active-assisted). Stop when you feel a gentle stretch in your shoulder, or when you reach the angle that is recommended by your health care provider. 4. Hold for __________ seconds. 5. Use the stick and your other arm to help you return your left / right arm to the starting position. Repeat __________ times. Complete this exercise __________ times a day. External rotation and abduction This exercise is sometimes called corner stretch. This exercise rotates your arm outward (external rotation) and moves your arm out from your body (abduction). 1. Stand in a door frame with one foot slightly in front of the other. This is called a staggered stance. If you cannot reach your forearms to the door frame, stand facing a corner of a room. 2. Choose one  of the following positions as told by your health care provider: ? Place your hands and forearms on the door frame above your head. ? Place your hands and forearms on the door frame at the height of your head. ? Place your hands on the door frame at the height of your elbows. 3. Slowly move your weight onto your front foot until you feel a stretch across your chest and in the front of your  shoulders. Keep your head and chest upright and keep your abdominal muscles tight. 4. Hold for __________ seconds. 5. To release the stretch, shift your weight to your back foot. Repeat __________ times. Complete this exercise __________ times a day. Strengthening exercises These exercises build strength and endurance in your shoulder. Endurance is the ability to use your muscles for a long time, even after they get tired. Scapular depression and adduction In this exercise, you push your shoulder blades down (depression) while pulling your arms toward the center of your spine (adduction). After you have practiced this exercise, try doing it without the arm support. Then, try the exercise while standing instead of sitting. 1. Sit on a stable chair. Support your arms in front of you with pillows, armrests, or a tabletop. Keep your elbows in line with the sides of your body (adduction). 2. Gently move your shoulder blades (scapulae) down toward your middle back. Relax the muscles on the tops of your shoulders and in the back of your neck. 3. Hold for __________ seconds. 4. Slowly release the tension and relax your muscles completely before you repeat the exercise. Repeat __________ times. Complete this exercise __________ times a day. Shoulder abduction, isometric In this exercise, you put pressure on your shoulder without moving your shoulder joint (isometric). 1. Stand or sit about 4-6 inches (10-15 cm) from a wall with your left / right side facing the wall. 2. Bend your left / right elbow and gently press your elbow against the wall as if you are trying to move your arm out to your side (abduction). 3. Increase the pressure slowly until you are pressing as hard as you can without shrugging or causing pain to your shoulder. 4. Hold for __________ seconds. 5. Slowly release the tension and relax your muscles completely before you repeat the exercise. Repeat __________ times. Complete this  exercise __________ times a day. Shoulder flexion, isometric  1. Stand or sit in a doorway, facing the door frame. 2. Keep your left / right arm straight and make a gentle fist with your hand. Place your fist against the door frame. Only your fist should be touching the frame. Keep your upper arm at your side. 3. Gently press your fist against the door frame, as if you are trying to raise your arm above your head (isometric shoulder flexion). ? Avoid shrugging your shoulder while you press your hand into the door frame. Keep your shoulder blade tucked down toward the middle of your back. 4. Hold for __________ seconds. 5. Slowly release the tension, and relax your muscles completely before you repeat the exercise. Repeat __________ times. Complete this exercise __________ times a day. Internal rotation, isotonic This is an exercise in which you use an exercise band and gently rotate your forearm toward your body (internal rotation) while moving your shoulder joint (isotonic). 1. Sit in a stable chair without armrests, or stand up. 2. At elbow height on your left / right side, secure an exercise band to a stable object. 3. Place a soft object,  such as a folded towel or a small pillow, between your left / right upper arm and your body so your elbow is about 4 inches (10 cm) away from your side. 4. Grasp the end of the exercise band in your left / right hand so it stretches with slight tension. 5. Keeping your elbow pressed against the soft object, slowly move your forearm in, toward your abdomen. Keep your body steady so the movement is only coming from your shoulder. 6. Hold for __________ seconds. 7. Slowly return to the starting position. Repeat __________ times. Complete this exercise __________ times a day. External rotation, isotonic This is an exercise in which you use an exercise band and gently rotate your forearm away from your body (external rotation) while moving your shoulder joint  (isotonic). 1. Sit in a stable chair without armrests, or stand up. 2. At elbow height on your left / right side, secure an exercise band to a stable object. 3. Place a soft object, such as a folded towel or a small pillow, between your left / right upper arm and your body so your elbow is about 4 inches (10 cm) away from your side. 4. Grasp the end of the exercise band in your left / right hand so it stretches with slight tension. 5. Keeping your elbow pressed against the soft object, slowly move your forearm out, away from your abdomen. Keep your body steady so the movement is only coming from your shoulder. 6. Hold for __________ seconds. 7. Slowly return to the starting position. Repeat __________ times. Complete this exercise __________ times a day. Shoulder extension  1. Sit in a stable chair without armrests, or stand up. Secure an exercise band to a stable object in front of you so the band is at shoulder height. 2. Hold one end of the exercise band in each hand. Your palms should face each other. 3. Straighten your elbows and lift your hands up to shoulder height. 4. Step back, away from the secured end of the exercise band, until the band stretches. 5. Squeeze your shoulder blades together as you pull your hands down to the sides of your thighs (extension). Stop when your hands are straight down by your sides. Do not let your hands go behind your body. 6. Hold for __________ seconds. 7. Slowly return to the starting position. Repeat __________ times. Complete this exercise __________ times a day. Shoulder extension, prone  1. Lie on your abdomen (prone position) on a firm surface so your left / right arm hangs over the edge. 2. Hold a __________ lb (kg) weight in your hand so your palm faces in toward your body. Your arm should be straight. 3. Squeeze your shoulder blade down toward the middle of your back. 4. Slowly raise your arm behind you, up to the height of the surface that  you are lying on (extension). Keep your arm straight. 5. Hold for __________ seconds. 6. Slowly return to the starting position and relax your muscles. Repeat __________ times. Complete this exercise __________ times a day. Prone horizontal abduction with external rotation 1. Lie on your abdomen (prone position) on a firm surface so your left / right arm hangs over the edge. 2. Hold a __________ lb (kg) weight in your hand so your palm faces toward your feet. Your arm should be straight. 3. Squeeze your shoulder blade down toward the middle of your back. 4. Bend your elbow so your hand moves up, until your elbow is bent to  a 90-degree angle (right angle). With your elbow bent, slowly move your forearm forward and up (horizontal abduction). Raise your hand up to the height of the surface that you are lying on. ? Your upper arm should not move, and your elbow should stay bent. ? At the top of the movement, your palm should face the floor. 5. Hold for __________ seconds. 6. Slowly return to the starting position and relax your muscles. Repeat __________ times. Complete this exercise __________ times a day. Standing horizontal abduction 1. Sit on a stable chair, or stand up. 2. Secure an exercise band to a stable object in front of you so the band is at shoulder height. 3. Hold one end of the exercise band in each hand. 4. Straighten your elbows and lift your hands straight in front of you, up to shoulder height. Your palms should face down, toward the floor. 5. Step back, away from the secured end of the exercise band, until the band stretches. 6. Move your arms out to your sides (horizontal abduction), and keep your arms straight. 7. Hold for __________ seconds. 8. Slowly return to the starting position. Repeat __________ times. Complete this exercise __________ times a day. Scapular retraction and elevation 1. Sit on a stable chair without armrests, or stand up. 2. Secure an exercise band to  a stable object in front of you so the band is at shoulder height. 3. Hold one end of the exercise band in each hand. Your palms should face each other. 4. Step back, away from the secured end of the exercise band, until the band stretches. 5. Squeeze your shoulder blades together (retraction) and lift your hands over your head (elevation). Keep your elbows straight. 6. Hold for __________ seconds. 7. Slowly return to the starting position. Repeat __________ times. Complete this exercise __________ times a day. This information is not intended to replace advice given to you by your health care provider. Make sure you discuss any questions you have with your health care provider. Document Revised: 02/15/2019 Document Reviewed: 02/15/2019 Elsevier Patient Education  [place].

## 2020-02-22 LAB — LIPID PANEL W/O CHOL/HDL RATIO
Cholesterol, Total: 174 mg/dL (ref 100–199)
HDL: 51 mg/dL (ref >39)
LDL Chol Calc (NIH): 110 mg/dL — ABNORMAL HIGH (ref 0–99)
Triglycerides: 71 mg/dL (ref 0–149)
VLDL Cholesterol Cal: 13 mg/dL (ref 5–40)

## 2020-02-22 LAB — COMPREHENSIVE METABOLIC PANEL
ALT: 16 IU/L (ref 0–32)
AST: 19 IU/L (ref 0–40)
Albumin/Globulin Ratio: 1.6 (ref 1.2–2.2)
Albumin: 4 g/dL (ref 3.7–4.7)
Alkaline Phosphatase: 90 IU/L (ref 39–117)
BUN/Creatinine Ratio: 30 — ABNORMAL HIGH (ref 12–28)
BUN: 29 mg/dL — ABNORMAL HIGH (ref 8–27)
Bilirubin Total: 0.8 mg/dL (ref 0.0–1.2)
CO2: 24 mmol/L (ref 20–29)
Calcium: 9.5 mg/dL (ref 8.7–10.3)
Chloride: 104 mmol/L (ref 96–106)
Creatinine, Ser: 0.98 mg/dL (ref 0.57–1.00)
GFR calc Af Amer: 67 mL/min/{1.73_m2} (ref >59)
GFR calc non Af Amer: 58 mL/min/{1.73_m2} — ABNORMAL LOW (ref >59)
Globulin, Total: 2.5 g/dL (ref 1.5–4.5)
Glucose: 95 mg/dL (ref 65–99)
Potassium: 4.5 mmol/L (ref 3.5–5.2)
Sodium: 140 mmol/L (ref 134–144)
Total Protein: 6.5 g/dL (ref 6.0–8.5)

## 2020-03-08 ENCOUNTER — Ambulatory Visit (INDEPENDENT_AMBULATORY_CARE_PROVIDER_SITE_OTHER): Payer: Medicare HMO | Admitting: Family Medicine

## 2020-03-08 ENCOUNTER — Encounter: Payer: Self-pay | Admitting: Family Medicine

## 2020-03-08 ENCOUNTER — Other Ambulatory Visit: Payer: Self-pay

## 2020-03-08 VITALS — BP 113/71 | HR 78 | Temp 98.1°F | Wt 265.8 lb

## 2020-03-08 DIAGNOSIS — M25562 Pain in left knee: Secondary | ICD-10-CM

## 2020-03-08 DIAGNOSIS — M1712 Unilateral primary osteoarthritis, left knee: Secondary | ICD-10-CM | POA: Diagnosis not present

## 2020-03-08 NOTE — Progress Notes (Signed)
BP 113/71 (BP Location: Left Arm, Patient Position: Sitting, Cuff Size: Normal)   Pulse 78   Temp 98.1 F (36.7 C) (Oral)   Wt 265 lb 12.8 oz (120.6 kg)   SpO2 99%   BMI 45.10 kg/m    Subjective:    Patient ID: Felicia Vargas, female    DOB: [birth year], 74 y.o.   MRN: [id number]  HPI: Felicia Vargas is a 74 y.o. female  Chief Complaint  Patient presents with  . Knee Pain    ongoing pain, worsedned monday evening.    KNEE PAIN Duration: weeks, worse the past couple of days Involved knee: left Mechanism of injury: unknown Location:anterior Onset: sudden Severity: severe  Quality:  sharp Frequency: constant Radiation: no Aggravating factors: weight bearing, walking, bending and movement  Alleviating factors: ice, NSAIDs, crutches and rest  Status: worse Treatments attempted: rest, ice, heat, APAP, ibuprofen and aleve  Relief with NSAIDs?:  no Weakness with weight bearing or walking: yes Sensation of giving way: yes Locking: no Popping: no Bruising: no Swelling: yes Redness: yes Paresthesias/decreased sensation: no Fevers: no  Relevant past medical, surgical, family and social history reviewed and updated as indicated. Interim medical history since our last visit reviewed. Allergies and medications reviewed and updated.  Review of Systems  Constitutional: Negative.   Respiratory: Negative.   Cardiovascular: Negative.   Gastrointestinal: Negative.   Musculoskeletal: Positive for arthralgias and joint swelling. Negative for back pain, gait problem, myalgias, neck pain and neck stiffness.  Skin: Negative.   Neurological: Negative.   Psychiatric/Behavioral: Negative.     Per HPI unless specifically indicated above     Objective:    BP 113/71 (BP Location: Left Arm, Patient Position: Sitting, Cuff Size: Normal)   Pulse 78   Temp 98.1 F (36.7 C) (Oral)   Wt 265 lb 12.8 oz (120.6 kg)   SpO2 99%   BMI 45.10 kg/m   Wt Readings from Last 3 Encounters:    03/08/20 265 lb 12.8 oz (120.6 kg)  02/21/20 267 lb 6.4 oz (121.3 kg)  02/13/20 261 lb (118.4 kg)    Physical Exam Vitals and nursing note reviewed.  Constitutional:      General: She is not in acute distress.    Appearance: Normal appearance. She is not ill-appearing, toxic-appearing or diaphoretic.  HENT:     Head: Normocephalic and atraumatic.     Right Ear: External ear normal.     Left Ear: External ear normal.     Nose: Nose normal.     Mouth/Throat:     Mouth: Mucous membranes are moist.     Pharynx: Oropharynx is clear.  Eyes:     General: No scleral icterus.       Right eye: No discharge.        Left eye: No discharge.     Extraocular Movements: Extraocular movements intact.     Conjunctiva/sclera: Conjunctivae normal.     Pupils: Pupils are equal, round, and reactive to light.  Cardiovascular:     Rate and Rhythm: Normal rate and regular rhythm.     Pulses: Normal pulses.     Heart sounds: Normal heart sounds. No murmur. No friction rub. No gallop.   Pulmonary:     Effort: Pulmonary effort is normal. No respiratory distress.     Breath sounds: Normal breath sounds. No stridor. No wheezing, rhonchi or rales.  Chest:     Chest wall: No tenderness.  Musculoskeletal:  General: Tenderness present. Normal range of motion.     Cervical back: Normal range of motion and neck supple.  Skin:    General: Skin is warm and dry.     Capillary Refill: Capillary refill takes less than 2 seconds.     Coloration: Skin is not jaundiced or pale.     Findings: No bruising, erythema, lesion or rash.  Neurological:     General: No focal deficit present.     Mental Status: She is alert and oriented to person, place, and time. Mental status is at baseline.  Psychiatric:        Mood and Affect: Mood normal.        Behavior: Behavior normal.        Thought Content: Thought content normal.        Judgment: Judgment normal.     Results for orders placed or performed in visit  on 02/21/20  Comprehensive metabolic panel  Result Value Ref Range   Glucose 95 65 - 99 mg/dL   BUN 29 (H) 8 - 27 mg/dL   Creatinine, Ser 0.98 0.57 - 1.00 mg/dL   GFR calc non Af Amer 58 (L) >59 mL/min/1.73   GFR calc Af Amer 67 >59 mL/min/1.73   BUN/Creatinine Ratio 30 (H) 12 - 28   Sodium 140 134 - 144 mmol/L   Potassium 4.5 3.5 - 5.2 mmol/L   Chloride 104 96 - 106 mmol/L   CO2 24 20 - 29 mmol/L   Calcium 9.5 8.7 - 10.3 mg/dL   Total Protein 6.5 6.0 - 8.5 g/dL   Albumin 4.0 3.7 - 4.7 g/dL   Globulin, Total 2.5 1.5 - 4.5 g/dL   Albumin/Globulin Ratio 1.6 1.2 - 2.2   Bilirubin Total 0.8 0.0 - 1.2 mg/dL   Alkaline Phosphatase 90 39 - 117 IU/L   AST 19 0 - 40 IU/L   ALT 16 0 - 32 IU/L  Lipid Panel w/o Chol/HDL Ratio  Result Value Ref Range   Cholesterol, Total 174 100 - 199 mg/dL   Triglycerides 71 0 - 149 mg/dL   HDL 51 >39 mg/dL   VLDL Cholesterol Cal 13 5 - 40 mg/dL   LDL Chol Calc (NIH) 110 (H) 0 - 99 mg/dL  UA/M w/rflx Culture, Routine   Specimen: Urine   URINE  Result Value Ref Range   Specific Gravity, UA <1.005 (L) 1.005 - 1.030   pH, UA 5.5 5.0 - 7.5   Color, UA Yellow Yellow   Appearance Ur Clear Clear   Leukocytes,UA Negative Negative   Protein,UA Negative Negative/Trace   Glucose, UA Negative Negative   Ketones, UA Negative Negative   RBC, UA Negative Negative   Bilirubin, UA Negative Negative   Urobilinogen, Ur 0.2 0.2 - 1.0 mg/dL   Nitrite, UA Negative Negative      Assessment & Plan:   Problem List Items Addressed This Visit    None    Visit Diagnoses    Acute pain of left knee    -  Primary   Has failed stretches and naproxen. Concern for pes anserine bursitis. Will get her into ortho. Referral generated today. Await their input.    Relevant Orders   Ambulatory referral to Orthopedic Surgery       Follow up plan: Return if symptoms worsen or fail to improve.

## 2020-03-21 ENCOUNTER — Other Ambulatory Visit: Payer: Self-pay | Admitting: Family Medicine

## 2020-03-21 DIAGNOSIS — Z1231 Encounter for screening mammogram for malignant neoplasm of breast: Secondary | ICD-10-CM

## 2020-04-30 ENCOUNTER — Ambulatory Visit
Admission: RE | Admit: 2020-04-30 | Discharge: 2020-04-30 | Disposition: A | Discharge status: Home / Self Care | Payer: Medicare HMO | Source: Ambulatory Visit | Attending: Family Medicine | Admitting: Family Medicine

## 2020-04-30 ENCOUNTER — Other Ambulatory Visit: Payer: Self-pay

## 2020-04-30 DIAGNOSIS — Z1231 Encounter for screening mammogram for malignant neoplasm of breast: Secondary | ICD-10-CM | POA: Diagnosis not present

## 2020-06-07 DIAGNOSIS — M1712 Unilateral primary osteoarthritis, left knee: Secondary | ICD-10-CM | POA: Diagnosis not present

## 2020-08-23 ENCOUNTER — Encounter: Payer: Medicare HMO | Admitting: Family Medicine

## 2020-08-26 ENCOUNTER — Other Ambulatory Visit: Payer: Self-pay | Admitting: Family Medicine

## 2020-09-10 ENCOUNTER — Other Ambulatory Visit: Payer: Self-pay

## 2020-09-10 ENCOUNTER — Ambulatory Visit (INDEPENDENT_AMBULATORY_CARE_PROVIDER_SITE_OTHER): Payer: Medicare HMO | Admitting: Family Medicine

## 2020-09-10 ENCOUNTER — Encounter: Payer: Self-pay | Admitting: Family Medicine

## 2020-09-10 VITALS — BP 139/77 | HR 80 | Temp 98.6°F | Ht 64.0 in | Wt 272.0 lb

## 2020-09-10 DIAGNOSIS — I10 Essential (primary) hypertension: Secondary | ICD-10-CM

## 2020-09-10 DIAGNOSIS — Z0000 Encounter for general adult medical examination without abnormal findings: Secondary | ICD-10-CM

## 2020-09-10 DIAGNOSIS — Z13820 Encounter for screening for osteoporosis: Secondary | ICD-10-CM

## 2020-09-10 DIAGNOSIS — Z1231 Encounter for screening mammogram for malignant neoplasm of breast: Secondary | ICD-10-CM

## 2020-09-10 DIAGNOSIS — N3281 Overactive bladder: Secondary | ICD-10-CM

## 2020-09-10 DIAGNOSIS — Z23 Encounter for immunization: Secondary | ICD-10-CM | POA: Diagnosis not present

## 2020-09-10 DIAGNOSIS — D692 Other nonthrombocytopenic purpura: Secondary | ICD-10-CM

## 2020-09-10 DIAGNOSIS — R609 Edema, unspecified: Secondary | ICD-10-CM | POA: Diagnosis not present

## 2020-09-10 DIAGNOSIS — E7800 Pure hypercholesterolemia, unspecified: Secondary | ICD-10-CM | POA: Diagnosis not present

## 2020-09-10 LAB — URINALYSIS, ROUTINE W REFLEX MICROSCOPIC
Bilirubin, UA: NEGATIVE
Glucose, UA: NEGATIVE
Ketones, UA: NEGATIVE
Leukocytes,UA: NEGATIVE
Nitrite, UA: NEGATIVE
Protein,UA: NEGATIVE
RBC, UA: NEGATIVE
Specific Gravity, UA: <1.005 — ABNORMAL LOW (ref 1.005–1.030)
Urobilinogen, Ur: 0.2 mg/dL (ref 0.2–1.0)
pH, UA: 5.5 (ref 5.0–7.5)

## 2020-09-10 LAB — MICROALBUMIN, URINE WAIVED
Creatinine, Urine Waived: 50 mg/dL (ref 10–300)
Microalb, Ur Waived: 10 mg/L (ref 0–19)
Microalb/Creat Ratio: <30 mg/g (ref <30)

## 2020-09-10 MED ORDER — PRAVASTATIN SODIUM 20 MG PO TABS
ORAL_TABLET | ORAL | 1 refills | Status: DC
Start: 2020-09-10 — End: 2021-03-11

## 2020-09-10 MED ORDER — LISINOPRIL-HYDROCHLOROTHIAZIDE 20-25 MG PO TABS
1.0000 | ORAL_TABLET | Freq: Every day | ORAL | 1 refills | Status: DC
Start: 2020-09-10 — End: 2021-03-11

## 2020-09-10 MED ORDER — OXYBUTYNIN CHLORIDE 5 MG PO TABS
ORAL_TABLET | ORAL | 3 refills | Status: DC
Start: 2020-09-10 — End: 2021-09-10

## 2020-09-10 MED ORDER — CYCLOBENZAPRINE HCL 5 MG PO TABS
5.0000 mg | ORAL_TABLET | Freq: Every day | ORAL | 0 refills | Status: DC
Start: 2020-09-10 — End: 2020-12-07

## 2020-09-10 MED ORDER — FLUTICASONE PROPIONATE 50 MCG/ACT NA SUSP
1.0000 | Freq: Every day | NASAL | 3 refills | Status: DC
Start: 2020-09-10 — End: 2021-10-28

## 2020-09-10 NOTE — Patient Instructions (Addendum)
Call to schedule your bone density and mammogram in June:  [facility]  Address: [address], [place], [place] [address]  Phone: [phone]   Health Maintenance After Age 72 After age 57, you are at a higher risk for certain long-term diseases and infections as well as injuries from falls. Falls are a major cause of broken bones and head injuries in people who are older than age 74. Getting regular preventive care can help to keep you healthy and well. Preventive care includes getting regular testing and making lifestyle changes as recommended by your health care provider. Talk with your health care provider about:  Which screenings and tests you should have. A screening is a test that checks for a disease when you have no symptoms.  A diet and exercise plan that is right for you. What should I know about screenings and tests to prevent falls? Screening and testing are the best ways to find a health problem early. Early diagnosis and treatment give you the best chance of managing medical conditions that are common after age 18. Certain conditions and lifestyle choices may make you more likely to have a fall. Your health care provider may recommend:  Regular vision checks. Poor vision and conditions such as cataracts can make you more likely to have a fall. If you wear glasses, make sure to get your prescription updated if your vision changes.  Medicine review. Work with your health care provider to regularly review all of the medicines you are taking, including over-the-counter medicines. Ask your health care provider about any side effects that may make you more likely to have a fall. Tell your health care provider if any medicines that you take make you feel dizzy or sleepy.  Osteoporosis screening. Osteoporosis is a condition that causes the bones to get weaker. This can make the bones weak and cause them to break more easily.  Blood pressure  screening. Blood pressure changes and medicines to control blood pressure can make you feel dizzy.  Strength and balance checks. Your health care provider may recommend certain tests to check your strength and balance while standing, walking, or changing positions.  Foot health exam. Foot pain and numbness, as well as not wearing proper footwear, can make you more likely to have a fall.  Depression screening. You may be more likely to have a fall if you have a fear of falling, feel emotionally low, or feel unable to do activities that you used to do.  Alcohol use screening. Using too much alcohol can affect your balance and may make you more likely to have a fall. What actions can I take to lower my risk of falls? General instructions  Talk with your health care provider about your risks for falling. Tell your health care provider if: ? You fall. Be sure to tell your health care provider about all falls, even ones that seem minor. ? You feel dizzy, sleepy, or off-balance.  Take over-the-counter and prescription medicines only as told by your health care provider. These include any supplements.  Eat a healthy diet and maintain a healthy weight. A healthy diet includes low-fat dairy products, low-fat (lean) meats, and fiber from whole grains, beans, and lots of fruits and vegetables. Home safety  Remove any tripping hazards, such as rugs, cords, and clutter.  Install safety equipment such as grab bars in bathrooms and safety rails on stairs.  Keep rooms and walkways well-lit. Activity   Follow a  regular exercise program to stay fit. This will help you maintain your balance. Ask your health care provider what types of exercise are appropriate for you.  If you need a cane or walker, use it as recommended by your health care provider.  Wear supportive shoes that have nonskid soles. Lifestyle  Do not drink alcohol if your health care provider tells you not to drink.  If you drink  alcohol, limit how much you have: ? 0-1 drink a day for women. ? 0-2 drinks a day for men.  Be aware of how much alcohol is in your drink. In the [place] one drink equals one typical bottle of beer (12 oz), one-half glass of wine (5 oz), or one shot of hard liquor (1 oz).  Do not use any products that contain nicotine or tobacco, such as cigarettes and e-cigarettes. If you need help quitting, ask your health care provider. Summary  Having a healthy lifestyle and getting preventive care can help to protect your health and wellness after age 31.  Screening and testing are the best way to find a health problem early and help you avoid having a fall. Early diagnosis and treatment give you the best chance for managing medical conditions that are more common for people who are older than age 43.  Falls are a major cause of broken bones and head injuries in people who are older than age 24. Take precautions to prevent a fall at home.  Work with your health care provider to learn what changes you can make to improve your health and wellness and to prevent falls. This information is not intended to replace advice given to you by your health care provider. Make sure you discuss any questions you have with your health care provider. Document Revised: 02/24/2019 Document Reviewed: 09/16/2017 Elsevier Patient Education  2020 [employer].

## 2020-09-10 NOTE — Assessment & Plan Note (Signed)
Under good control on current regimen. Continue current regimen. Continue to monitor. Call with any concerns. Refills given. Labs drawn today.   

## 2020-09-10 NOTE — Progress Notes (Signed)
BP 139/77 (BP Location: Left Arm, Patient Position: Sitting)   Pulse 80   Temp 98.6 F (37 C) (Oral)   Ht 5\' 4"  (1.626 m)   Wt 272 lb (123.4 kg)   SpO2 99%   BMI 46.69 kg/m    Subjective:    Patient ID: Felicia Vargas, female    DOB: 1946/01/26, 74 y.o.   MRN: [id number]  HPI: Felicia Vargas is a 74 y.o. female presenting on 09/10/2020 for comprehensive medical examination. Current medical complaints include:  Has been moving and has been on her feet a lot because of that. Her legs have been swelling. She has not been wearing her compression hose. Her L knee has been swelling   HYPERTENSION / HYPERLIPIDEMIA Satisfied with current treatment? yes Duration of hypertension: chronic BP monitoring frequency: not checking BP medication side effects: no Past BP meds: lisinopril-HCTZ Duration of hyperlipidemia: chronic Cholesterol medication side effects: no Cholesterol supplements: fish oil Past cholesterol medications: pravastatin Medication compliance: excellent compliance Aspirin: yes Recent stressors: no Recurrent headaches: no Visual changes: no Palpitations: no Dyspnea: no Chest pain: no Lower extremity edema: yes Dizzy/lightheaded: no  She currently lives with: daughter Menopausal Symptoms: no  Depression Screen done today and results listed below:  Depression screen [facility] 2/9 09/10/2020 02/13/2020 08/22/2019 02/10/2019 08/09/2018  Decreased Interest 0 0 0 0 0  Down, Depressed, Hopeless 0 0 0 0 0  PHQ - 2 Score 0 0 0 0 0  Altered sleeping 0 - 0 - 0  Tired, decreased energy 0 - 0 - 0  Change in appetite 0 - 0 - 0  Feeling bad or failure about yourself  0 - 0 - 0  Trouble concentrating 0 - 0 - 0  Moving slowly or fidgety/restless 0 - 0 - 0  Suicidal thoughts 0 - 0 - 0  PHQ-9 Score 0 - 0 - 0  Difficult doing work/chores Not difficult at all - Not difficult at all - Not difficult at all    Past Medical History:  Past Medical History:  Diagnosis Date  . Allergy     . Arthritis   . Cancer ([place])    skin  . Diffuse cystic mastopathy 2013  . High cholesterol   . Hypertension 1990's  . Migraine   . Migraines   . Overactive bladder   . Urinary incontinence     Surgical History:  Past Surgical History:  Procedure Laterality Date  . BREAST BIOPSY Right 05/31/93   neg/lump  . COLONOSCOPY  11/2004   Dr. Chauncey Reading  . COLONOSCOPY WITH PROPOFOL N/A 10/15/2016   Procedure: COLONOSCOPY WITH PROPOFOL;  Surgeon: Christene Lye, MD;  Location: [facility] ENDOSCOPY;  Service: Endoscopy;  Laterality: N/A;  . KIDNEY SURGERY  1998  . LITHOTRIPSY  1998    Medications:  Current Outpatient Medications on File Prior to Visit  Medication Sig  . aspirin 81 MG tablet Take 81 mg by mouth daily.  . calcium carbonate (OS-CAL) 600 MG TABS Take 600 mg by mouth daily with breakfast.   . Cholecalciferol (VITAMIN D PO) Take 1 capsule by mouth daily.  Marland Kitchen docusate sodium (COLACE) 100 MG capsule Take 100 mg by mouth daily.   Marland Kitchen ketoconazole (NIZORAL) 2 % cream Apply topically 2 (two) times daily.  Marland Kitchen loratadine (CLARITIN) 10 MG tablet Take 10 mg by mouth daily.  . meloxicam (MOBIC) 15 MG tablet Take 15 mg by mouth daily.  Marland Kitchen NAPROXEN PO Take by mouth daily.  . Omega-3  Fatty Acids (FISH OIL PO) Take by mouth in the morning and at bedtime.  . TURMERIC PO Take 1 tablet by mouth 2 (two) times daily.    No current facility-administered medications on file prior to visit.    Allergies:  No Known Allergies  Social History:  Social History   Socioeconomic History  . Marital status: Widowed    Spouse name: Not on file  . Number of children: Not on file  . Years of education: Not on file  . Highest education level: 12th grade  Occupational History  . Occupation: retired  Tobacco Use  . Smoking status: Never Smoker  . Smokeless tobacco: Never Used  Vaping Use  . Vaping Use: Never used  Substance and Sexual Activity  . Alcohol use: No  . Drug use: No  . Sexual activity:  Never  Other Topics Concern  . Not on file  Social History Narrative  . Not on file   Social Determinants of Health   Financial Resource Strain: Low Risk   . Difficulty of Paying Living Expenses: Not hard at all  Food Insecurity: No Food Insecurity  . Worried About Charity fundraiser in the Last Year: Never true  . Ran Out of Food in the Last Year: Never true  Transportation Needs: No Transportation Needs  . Lack of Transportation (Medical): No  . Lack of Transportation (Non-Medical): No  Physical Activity: Inactive  . Days of Exercise per Week: 0 days  . Minutes of Exercise per Session: 0 min  Stress:   . Feeling of Stress : Not on file  Social Connections: Moderately Integrated  . Frequency of Communication with Friends and Family: More than three times a week  . Frequency of Social Gatherings with Friends and Family: More than three times a week  . Attends Religious Services: More than 4 times per year  . Active Member of Clubs or Organizations: Yes  . Attends Archivist Meetings: More than 4 times per year  . Marital Status: Widowed  Intimate Partner Violence:   . Fear of Current or Ex-Partner: Not on file  . Emotionally Abused: Not on file  . Physically Abused: Not on file  . Sexually Abused: Not on file   Social History   Tobacco Use  Smoking Status Never Smoker  Smokeless Tobacco Never Used   Social History   Substance and Sexual Activity  Alcohol Use No    Family History:  Family History  Problem Relation Age of Onset  . Lung cancer Father   . Stroke Mother   . Hypertension Sister   . Arthritis Sister   . Dementia Sister   . Breast cancer Neg Hx     Past medical history, surgical history, medications, allergies, family history and social history reviewed with patient today and changes made to appropriate areas of the chart.   Review of Systems  Constitutional: Negative.   HENT: Negative.   Eyes: Negative.   Respiratory: Negative.     Cardiovascular: Positive for leg swelling. Negative for chest pain, palpitations, orthopnea, claudication and PND.  Gastrointestinal: Negative.   Genitourinary: Negative.   Musculoskeletal: Positive for joint pain. Negative for back pain, falls, myalgias and neck pain.  Skin: Negative.   Endo/Heme/Allergies: Negative for environmental allergies and polydipsia. Bruises/bleeds easily.    All other ROS negative except what is listed above and in the HPI.      Objective:    BP 139/77 (BP Location: Left Arm, Patient Position: Sitting)  Pulse 80   Temp 98.6 F (37 C) (Oral)   Ht 5\' 4"  (1.626 m)   Wt 272 lb (123.4 kg)   SpO2 99%   BMI 46.69 kg/m   Wt Readings from Last 3 Encounters:  09/10/20 272 lb (123.4 kg)  03/08/20 265 lb 12.8 oz (120.6 kg)  02/21/20 267 lb 6.4 oz (121.3 kg)    Physical Exam Vitals and nursing note reviewed.  Constitutional:      General: She is not in acute distress.    Appearance: Normal appearance. She is obese. She is not ill-appearing, toxic-appearing or diaphoretic.  HENT:     Head: Normocephalic and atraumatic.     Right Ear: Tympanic membrane, ear canal and external ear normal. There is no impacted cerumen.     Left Ear: Tympanic membrane, ear canal and external ear normal. There is no impacted cerumen.     Nose: Nose normal. No congestion or rhinorrhea.     Mouth/Throat:     Mouth: Mucous membranes are moist.     Pharynx: Oropharynx is clear. No oropharyngeal exudate or posterior oropharyngeal erythema.  Eyes:     General: No scleral icterus.       Right eye: No discharge.        Left eye: No discharge.     Extraocular Movements: Extraocular movements intact.     Conjunctiva/sclera: Conjunctivae normal.     Pupils: Pupils are equal, round, and reactive to light.  Neck:     Vascular: No carotid bruit.  Cardiovascular:     Rate and Rhythm: Normal rate and regular rhythm.     Pulses: Normal pulses.     Heart sounds: No murmur heard.  No  friction rub. No gallop.   Pulmonary:     Effort: Pulmonary effort is normal. No respiratory distress.     Breath sounds: Normal breath sounds. No stridor. No wheezing, rhonchi or rales.  Chest:     Chest wall: No tenderness.  Abdominal:     General: Abdomen is flat. Bowel sounds are normal. There is no distension.     Palpations: Abdomen is soft. There is no mass.     Tenderness: There is no abdominal tenderness. There is no right CVA tenderness, left CVA tenderness, guarding or rebound.     Hernia: No hernia is present.  Genitourinary:    Comments: Breast and pelvic exams deferred with shared decision making Musculoskeletal:        General: No swelling, tenderness, deformity or signs of injury.     Cervical back: Normal range of motion and neck supple. No rigidity. No muscular tenderness.     Right lower leg: No edema.     Left lower leg: No edema.  Lymphadenopathy:     Cervical: No cervical adenopathy.  Skin:    General: Skin is warm and dry.     Capillary Refill: Capillary refill takes less than 2 seconds.     Coloration: Skin is not jaundiced or pale.     Findings: Bruising present. No erythema, lesion or rash.  Neurological:     General: No focal deficit present.     Mental Status: She is alert and oriented to person, place, and time. Mental status is at baseline.     Cranial Nerves: No cranial nerve deficit.     Sensory: No sensory deficit.     Motor: No weakness.     Coordination: Coordination normal.     Gait: Gait normal.  Deep Tendon Reflexes: Reflexes normal.  Psychiatric:        Mood and Affect: Mood normal.        Behavior: Behavior normal.        Thought Content: Thought content normal.        Judgment: Judgment normal.     Results for orders placed or performed in visit on 02/21/20  Comprehensive metabolic panel  Result Value Ref Range   Glucose 95 65 - 99 mg/dL   BUN 29 (H) 8 - 27 mg/dL   Creatinine, Ser 0.98 0.57 - 1.00 mg/dL   GFR calc non Af Amer  58 (L) >59 mL/min/1.73   GFR calc Af Amer 67 >59 mL/min/1.73   BUN/Creatinine Ratio 30 (H) 12 - 28   Sodium 140 134 - 144 mmol/L   Potassium 4.5 3.5 - 5.2 mmol/L   Chloride 104 96 - 106 mmol/L   CO2 24 20 - 29 mmol/L   Calcium 9.5 8.7 - 10.3 mg/dL   Total Protein 6.5 6.0 - 8.5 g/dL   Albumin 4.0 3.7 - 4.7 g/dL   Globulin, Total 2.5 1.5 - 4.5 g/dL   Albumin/Globulin Ratio 1.6 1.2 - 2.2   Bilirubin Total 0.8 0.0 - 1.2 mg/dL   Alkaline Phosphatase 90 39 - 117 IU/L   AST 19 0 - 40 IU/L   ALT 16 0 - 32 IU/L  Lipid Panel w/o Chol/HDL Ratio  Result Value Ref Range   Cholesterol, Total 174 100 - 199 mg/dL   Triglycerides 71 0 - 149 mg/dL   HDL 51 >39 mg/dL   VLDL Cholesterol Cal 13 5 - 40 mg/dL   LDL Chol Calc (NIH) 110 (H) 0 - 99 mg/dL  UA/M w/rflx Culture, Routine   Specimen: Urine   URINE  Result Value Ref Range   Specific Gravity, UA <1.005 (L) 1.005 - 1.030   pH, UA 5.5 5.0 - 7.5   Color, UA Yellow Yellow   Appearance Ur Clear Clear   Leukocytes,UA Negative Negative   Protein,UA Negative Negative/Trace   Glucose, UA Negative Negative   Ketones, UA Negative Negative   RBC, UA Negative Negative   Bilirubin, UA Negative Negative   Urobilinogen, Ur 0.2 0.2 - 1.0 mg/dL   Nitrite, UA Negative Negative      Assessment & Plan:   Problem List Items Addressed This Visit      Cardiovascular and Mediastinum   Hypertension    Under good control on current regimen. Continue current regimen. Continue to monitor. Call with any concerns. Refills given. Labs drawn today.       Relevant Medications   pravastatin (PRAVACHOL) 20 MG tablet   lisinopril-hydrochlorothiazide (ZESTORETIC) 20-25 MG tablet   Other Relevant Orders   CBC with Differential/Platelet   Comprehensive metabolic panel   Microalbumin, Urine Waived   TSH     Genitourinary   OAB (overactive bladder)    Under good control on current regimen. Continue current regimen. Continue to monitor. Call with any concerns.  Refills given. Labs drawn today.      Relevant Orders   Urinalysis, Routine w reflex microscopic     Other   High cholesterol    Under good control on current regimen. Continue current regimen. Continue to monitor. Call with any concerns. Refills given. Labs drawn today.      Relevant Medications   pravastatin (PRAVACHOL) 20 MG tablet   lisinopril-hydrochlorothiazide (ZESTORETIC) 20-25 MG tablet   Other Relevant Orders   CBC with Differential/Platelet  Comprehensive metabolic panel   Lipid Panel w/o Chol/HDL Ratio    Other Visit Diagnoses    Routine general medical examination at a health care facility    -  Primary   Vaccines up to date. Screening labs checked today. Mammogram, DEXA and colonscopy up to date. Continue diet and exercise. Call with any concerns.    Senile purpura ([place])       Reassured patient. Continue to monitor. Call with any concerns.    Relevant Medications   pravastatin (PRAVACHOL) 20 MG tablet   lisinopril-hydrochlorothiazide (ZESTORETIC) 20-25 MG tablet   Other Relevant Orders   CBC with Differential/Platelet   Comprehensive metabolic panel   Need for immunization against influenza       Flu shot given today.   Relevant Orders   Flu Vaccine QUAD High Dose(Fluad) (Completed)   Screening for osteoporosis       DEXA ordered today.   Relevant Orders   DG Bone Density   Encounter for screening mammogram for malignant neoplasm of breast       Mammogram ordered today.   Relevant Orders   MM 3D SCREEN BREAST BILATERAL   Peripheral edema       Wear compression hose. Call if not getting better. Labs drawn today.       Follow up plan: Return in about 6 months (around 03/11/2021).   LABORATORY TESTING:  - Pap smear: not applicable  IMMUNIZATIONS:   - Tdap: Tetanus vaccination status reviewed: last tetanus booster within 10 years. - Influenza: Administered today - Pneumovax: Up to date - Prevnar: Up to date - COVID: Up to  date  SCREENING: -Mammogram: Up to date  - Colonoscopy: Up to date  - Bone Density: Ordered today   PATIENT COUNSELING:   Advised to take 1 mg of folate supplement per day if capable of pregnancy.   Sexuality: Discussed sexually transmitted diseases, partner selection, use of condoms, avoidance of unintended pregnancy  and contraceptive alternatives.   Advised to avoid cigarette smoking.  I discussed with the patient that most people either abstain from alcohol or drink within safe limits (<=14/week and <=4 drinks/occasion for males, <=7/weeks and <= 3 drinks/occasion for females) and that the risk for alcohol disorders and other health effects rises proportionally with the number of drinks per week and how often a drinker exceeds daily limits.  Discussed cessation/primary prevention of drug use and availability of treatment for abuse.   Diet: Encouraged to adjust caloric intake to maintain  or achieve ideal body weight, to reduce intake of dietary saturated fat and total fat, to limit sodium intake by avoiding high sodium foods and not adding table salt, and to maintain adequate dietary potassium and calcium preferably from fresh fruits, vegetables, and low-fat dairy products.    stressed the importance of regular exercise  Injury prevention: Discussed safety belts, safety helmets, smoke detector, smoking near bedding or upholstery.   Dental health: Discussed importance of regular tooth brushing, flossing, and dental visits.    NEXT PREVENTATIVE PHYSICAL DUE IN 1 YEAR. Return in about 6 months (around 03/11/2021).

## 2020-09-11 LAB — CBC WITH DIFFERENTIAL/PLATELET
Basophils Absolute: 0.1 10*3/uL (ref 0.0–0.2)
Basos: 2 %
EOS (ABSOLUTE): 0.1 10*3/uL (ref 0.0–0.4)
Eos: 2 %
Hematocrit: 36.1 % (ref 34.0–46.6)
Hemoglobin: 12.2 g/dL (ref 11.1–15.9)
Immature Grans (Abs): 0 10*3/uL (ref 0.0–0.1)
Immature Granulocytes: 0 %
Lymphocytes Absolute: 1.3 10*3/uL (ref 0.7–3.1)
Lymphs: 23 %
MCH: 32.6 pg (ref 26.6–33.0)
MCHC: 33.8 g/dL (ref 31.5–35.7)
MCV: 97 fL (ref 79–97)
Monocytes Absolute: 0.5 10*3/uL (ref 0.1–0.9)
Monocytes: 9 %
Neutrophils Absolute: 3.6 10*3/uL (ref 1.4–7.0)
Neutrophils: 64 %
Platelets: 236 10*3/uL (ref 150–450)
RBC: 3.74 x10E6/uL — ABNORMAL LOW (ref 3.77–5.28)
RDW: 12.1 % (ref 11.7–15.4)
WBC: 5.7 10*3/uL (ref 3.4–10.8)

## 2020-09-11 LAB — COMPREHENSIVE METABOLIC PANEL
ALT: 20 IU/L (ref 0–32)
AST: 26 IU/L (ref 0–40)
Albumin/Globulin Ratio: 1.6 (ref 1.2–2.2)
Albumin: 4 g/dL (ref 3.7–4.7)
Alkaline Phosphatase: 90 IU/L (ref 44–121)
BUN/Creatinine Ratio: 21 (ref 12–28)
BUN: 19 mg/dL (ref 8–27)
Bilirubin Total: 0.6 mg/dL (ref 0.0–1.2)
CO2: 22 mmol/L (ref 20–29)
Calcium: 9.6 mg/dL (ref 8.7–10.3)
Chloride: 104 mmol/L (ref 96–106)
Creatinine, Ser: 0.89 mg/dL (ref 0.57–1.00)
GFR calc Af Amer: 75 mL/min/{1.73_m2} (ref >59)
GFR calc non Af Amer: 65 mL/min/{1.73_m2} (ref >59)
Globulin, Total: 2.5 g/dL (ref 1.5–4.5)
Glucose: 94 mg/dL (ref 65–99)
Potassium: 4.1 mmol/L (ref 3.5–5.2)
Sodium: 140 mmol/L (ref 134–144)
Total Protein: 6.5 g/dL (ref 6.0–8.5)

## 2020-09-11 LAB — LIPID PANEL W/O CHOL/HDL RATIO
Cholesterol, Total: 159 mg/dL (ref 100–199)
HDL: 50 mg/dL (ref >39)
LDL Chol Calc (NIH): 96 mg/dL (ref 0–99)
Triglycerides: 67 mg/dL (ref 0–149)
VLDL Cholesterol Cal: 13 mg/dL (ref 5–40)

## 2020-09-11 LAB — TSH: TSH: 2.77 u[IU]/mL (ref 0.450–4.500)

## 2020-10-15 DIAGNOSIS — M1712 Unilateral primary osteoarthritis, left knee: Secondary | ICD-10-CM | POA: Diagnosis not present

## 2020-12-06 ENCOUNTER — Other Ambulatory Visit: Payer: Self-pay | Admitting: Family Medicine

## 2020-12-06 NOTE — Telephone Encounter (Signed)
Requested medications are due for refill today yes  Requested medications are on the active medication list yes  Last refill 10/25  Last visit 09/11/19  Future visit scheduled 03/11/21  Notes to clinic Not Delegated

## 2020-12-07 NOTE — Telephone Encounter (Signed)
Patient last seen 09/10/20 and has appointment 03/11/21

## 2020-12-18 DIAGNOSIS — D0472 Carcinoma in situ of skin of left lower limb, including hip: Secondary | ICD-10-CM | POA: Diagnosis not present

## 2021-01-03 DIAGNOSIS — Z859 Personal history of malignant neoplasm, unspecified: Secondary | ICD-10-CM | POA: Diagnosis not present

## 2021-01-03 DIAGNOSIS — L570 Actinic keratosis: Secondary | ICD-10-CM | POA: Diagnosis not present

## 2021-01-03 DIAGNOSIS — D485 Neoplasm of uncertain behavior of skin: Secondary | ICD-10-CM | POA: Diagnosis not present

## 2021-01-03 DIAGNOSIS — D0472 Carcinoma in situ of skin of left lower limb, including hip: Secondary | ICD-10-CM | POA: Diagnosis not present

## 2021-01-03 DIAGNOSIS — L578 Other skin changes due to chronic exposure to nonionizing radiation: Secondary | ICD-10-CM | POA: Diagnosis not present

## 2021-01-03 DIAGNOSIS — Z872 Personal history of diseases of the skin and subcutaneous tissue: Secondary | ICD-10-CM | POA: Diagnosis not present

## 2021-01-21 DIAGNOSIS — L0390 Cellulitis, unspecified: Secondary | ICD-10-CM | POA: Diagnosis not present

## 2021-02-04 DIAGNOSIS — L0390 Cellulitis, unspecified: Secondary | ICD-10-CM | POA: Diagnosis not present

## 2021-02-04 DIAGNOSIS — C44729 Squamous cell carcinoma of skin of left lower limb, including hip: Secondary | ICD-10-CM | POA: Diagnosis not present

## 2021-02-04 HISTORY — PX: OTHER SURGICAL HISTORY: SHX169

## 2021-02-04 HISTORY — PX: SQUAMOUS CELL CARCINOMA EXCISION: SHX2433

## 2021-02-21 ENCOUNTER — Ambulatory Visit: Payer: Medicare HMO

## 2021-02-22 ENCOUNTER — Ambulatory Visit (INDEPENDENT_AMBULATORY_CARE_PROVIDER_SITE_OTHER): Payer: Medicare HMO

## 2021-02-22 VITALS — Ht 64.0 in | Wt 267.0 lb

## 2021-02-22 DIAGNOSIS — Z0000 Encounter for general adult medical examination without abnormal findings: Secondary | ICD-10-CM

## 2021-02-22 NOTE — Patient Instructions (Signed)
[name] , Thank you for taking time to come for your Medicare Wellness Visit. I appreciate your ongoing commitment to your health goals. Please review the following plan we discussed and let me know if I can assist you in the future.   Screening recommendations/referrals: Colonoscopy: completed 10/15/2016 Mammogram: scheduled 05/01/2021 Bone Density: scheduled 05/01/2021 Recommended yearly ophthalmology/optometry visit for glaucoma screening and checkup Recommended yearly dental visit for hygiene and checkup  Vaccinations: Influenza vaccine: completed 09/10/2020, due 06/17/2021 Pneumococcal vaccine: completed 02/02/2017 Tdap vaccine: completed 01/18/2016, due 01/17/2026 Shingles vaccine:  discussed   Covid-19: 09/25/2020, 01/25/2020, 01/01/2020  Advanced directives: Please bring a copy of your POA (Power of Attorney) and/or Living Will to your next appointment.   Conditions/risks identified: none  Next appointment: Follow up in one year for your annual wellness visit    Preventive Care 73 Years and Older, Female Preventive care refers to lifestyle choices and visits with your health care provider that can promote health and wellness. What does preventive care include?  A yearly physical exam. This is also called an annual well check.  Dental exams once or twice a year.  Routine eye exams. Ask your health care provider how often you should have your eyes checked.  Personal lifestyle choices, including:  Daily care of your teeth and gums.  Regular physical activity.  Eating a healthy diet.  Avoiding tobacco and drug use.  Limiting alcohol use.  Practicing safe sex.  Taking low-dose aspirin every day.  Taking vitamin and mineral supplements as recommended by your health care provider. What happens during an annual well check? The services and screenings done by your health care provider during your annual well check will depend on your age, overall health, lifestyle risk  factors, and family history of disease. Counseling  Your health care provider may ask you questions about your:  Alcohol use.  Tobacco use.  Drug use.  Emotional well-being.  Home and relationship well-being.  Sexual activity.  Eating habits.  History of falls.  Memory and ability to understand (cognition).  Work and work Statistician.  Reproductive health. Screening  You may have the following tests or measurements:  Height, weight, and BMI.  Blood pressure.  Lipid and cholesterol levels. These may be checked every 5 years, or more frequently if you are over 73 years old.  Skin check.  Lung cancer screening. You may have this screening every year starting at age 73 if you have a 30-pack-year history of smoking and currently smoke or have quit within the past 15 years.  Fecal occult blood test (FOBT) of the stool. You may have this test every year starting at age 73.  Flexible sigmoidoscopy or colonoscopy. You may have a sigmoidoscopy every 5 years or a colonoscopy every 10 years starting at age 73.  Hepatitis C blood test.  Hepatitis B blood test.  Sexually transmitted disease (STD) testing.  Diabetes screening. This is done by checking your blood sugar (glucose) after you have not eaten for a while (fasting). You may have this done every 1-3 years.  Bone density scan. This is done to screen for osteoporosis. You may have this done starting at age 73.  Mammogram. This may be done every 1-2 years. Talk to your health care provider about how often you should have regular mammograms. Talk with your health care provider about your test results, treatment options, and if necessary, the need for more tests. Vaccines  Your health care provider may recommend certain vaccines, such as:  Influenza vaccine. This is recommended every year.  Tetanus, diphtheria, and acellular pertussis (Tdap, Td) vaccine. You may need a Td booster every 10 years.  Zoster vaccine. You  may need this after age 73.  Pneumococcal 13-valent conjugate (PCV13) vaccine. One dose is recommended after age 25.  Pneumococcal polysaccharide (PPSV23) vaccine. One dose is recommended after age 73. Talk to your health care provider about which screenings and vaccines you need and how often you need them. This information is not intended to replace advice given to you by your health care provider. Make sure you discuss any questions you have with your health care provider. Document Released: 11/30/2015 Document Revised: 07/23/2016 Document Reviewed: 09/04/2015 Elsevier Interactive Patient Education  2017 [place] Prevention in the Home Falls can cause injuries. They can happen to people of all ages. There are many things you can do to make your home safe and to help prevent falls. What can I do on the outside of my home?  Regularly fix the edges of walkways and driveways and fix any cracks.  Remove anything that might make you trip as you walk through a door, such as a raised step or threshold.  Trim any bushes or trees on the path to your home.  Use bright outdoor lighting.  Clear any walking paths of anything that might make someone trip, such as rocks or tools.  Regularly check to see if handrails are loose or broken. Make sure that both sides of any steps have handrails.  Any raised decks and porches should have guardrails on the edges.  Have any leaves, snow, or ice cleared regularly.  Use sand or salt on walking paths during winter.  Clean up any spills in your garage right away. This includes oil or grease spills. What can I do in the bathroom?  Use night lights.  Install grab bars by the toilet and in the tub and shower. Do not use towel bars as grab bars.  Use non-skid mats or decals in the tub or shower.  If you need to sit down in the shower, use a plastic, non-slip stool.  Keep the floor dry. Clean up any water that spills on the floor as soon as  it happens.  Remove soap buildup in the tub or shower regularly.  Attach bath mats securely with double-sided non-slip rug tape.  Do not have throw rugs and other things on the floor that can make you trip. What can I do in the bedroom?  Use night lights.  Make sure that you have a light by your bed that is easy to reach.  Do not use any sheets or blankets that are too big for your bed. They should not hang down onto the floor.  Have a firm chair that has side arms. You can use this for support while you get dressed.  Do not have throw rugs and other things on the floor that can make you trip. What can I do in the kitchen?  Clean up any spills right away.  Avoid walking on wet floors.  Keep items that you use a lot in easy-to-reach places.  If you need to reach something above you, use a strong step stool that has a grab bar.  Keep electrical cords out of the way.  Do not use floor polish or wax that makes floors slippery. If you must use wax, use non-skid floor wax.  Do not have throw rugs and other things on the floor that  can make you trip. What can I do with my stairs?  Do not leave any items on the stairs.  Make sure that there are handrails on both sides of the stairs and use them. Fix handrails that are broken or loose. Make sure that handrails are as long as the stairways.  Check any carpeting to make sure that it is firmly attached to the stairs. Fix any carpet that is loose or worn.  Avoid having throw rugs at the top or bottom of the stairs. If you do have throw rugs, attach them to the floor with carpet tape.  Make sure that you have a light switch at the top of the stairs and the bottom of the stairs. If you do not have them, ask someone to add them for you. What else can I do to help prevent falls?  Wear shoes that:  Do not have high heels.  Have rubber bottoms.  Are comfortable and fit you well.  Are closed at the toe. Do not wear sandals.  If  you use a stepladder:  Make sure that it is fully opened. Do not climb a closed stepladder.  Make sure that both sides of the stepladder are locked into place.  Ask someone to hold it for you, if possible.  Clearly mark and make sure that you can see:  Any grab bars or handrails.  First and last steps.  Where the edge of each step is.  Use tools that help you move around (mobility aids) if they are needed. These include:  Canes.  Walkers.  Scooters.  Crutches.  Turn on the lights when you go into a dark area. Replace any light bulbs as soon as they burn out.  Set up your furniture so you have a clear path. Avoid moving your furniture around.  If any of your floors are uneven, fix them.  If there are any pets around you, be aware of where they are.  Review your medicines with your doctor. Some medicines can make you feel dizzy. This can increase your chance of falling. Ask your doctor what other things that you can do to help prevent falls. This information is not intended to replace advice given to you by your health care provider. Make sure you discuss any questions you have with your health care provider. Document Released: 08/30/2009 Document Revised: 04/10/2016 Document Reviewed: 12/08/2014 Elsevier Interactive Patient Education  2017 [employer].

## 2021-02-22 NOTE — Progress Notes (Signed)
I connected with Felicia Vargas today by telephone and verified that I am speaking with the correct person using two identifiers. Location patient: home Location provider: work Persons participating in the virtual visit: Felicia Vargas, Felicia Durand LPN.   I discussed the limitations, risks, security and privacy concerns of performing an evaluation and management service by telephone and the availability of in person appointments. I also discussed with the patient that there may be a patient responsible charge related to this service. The patient expressed understanding and verbally consented to this telephonic visit.    Interactive audio and video telecommunications were attempted between this provider and patient, however failed, due to patient having technical difficulties OR patient did not have access to video capability.  We continued and completed visit with audio only.     Vital signs may be patient reported or missing.  Subjective:   Felicia Vargas is a 75 y.o. female who presents for Medicare Annual (Subsequent) preventive examination.  Review of Systems     Cardiac Risk Factors include: advanced age (>7men, >1 women);hypertension;obesity (BMI >30kg/m2);sedentary lifestyle     Objective:    Today's Vitals   02/22/21 1027  Weight: 267 lb (121.1 kg)  Height: 5\' 4"  (1.626 m)   Body mass index is 45.83 kg/m.  Advanced Directives 02/22/2021 02/13/2020 02/10/2019 02/01/2018 02/02/2017 03/23/2016 01/18/2016  Does Patient Have a Medical Advance Directive? Yes Yes Yes Yes Yes Yes Yes  Type of Paramedic of [place];Living will Living will;Healthcare Power of Attorney Living will;Healthcare Power of [place];Living will [place];Living will [place];Living will Living will;Healthcare Power of Attorney  Does patient want to make changes to medical advance directive? - - - - No - Patient declined -  Yes - information given  Copy of [place] in Chart? No - copy requested No - copy requested - No - copy requested No - copy requested - No - copy requested    Current Medications (verified) Outpatient Encounter Medications as of 02/22/2021  Medication Sig  . aspirin 81 MG tablet Take 81 mg by mouth daily.  . calcium carbonate (OS-CAL) 600 MG TABS Take 600 mg by mouth daily with breakfast.   . Cholecalciferol (VITAMIN D PO) Take 1 capsule by mouth daily.  . cyclobenzaprine (FLEXERIL) 5 MG tablet TAKE 1-2 TABLETS (5-10 MG TOTAL) BY MOUTH AT BEDTIME.  Marland Kitchen docusate sodium (COLACE) 100 MG capsule Take 100 mg by mouth daily.   . fluticasone (FLONASE) 50 MCG/ACT nasal spray Place 1 spray into both nostrils daily.  Marland Kitchen ketoconazole (NIZORAL) 2 % cream Apply topically 2 (two) times daily.  Marland Kitchen lisinopril-hydrochlorothiazide (ZESTORETIC) 20-25 MG tablet Take 1 tablet by mouth daily.  Marland Kitchen loratadine (CLARITIN) 10 MG tablet Take 10 mg by mouth daily.  . meloxicam (MOBIC) 15 MG tablet Take 15 mg by mouth daily.  Marland Kitchen NAPROXEN PO Take by mouth daily.  . Omega-3 Fatty Acids (FISH OIL PO) Take by mouth in the morning and at bedtime.  Marland Kitchen oxybutynin (DITROPAN) 5 MG tablet TAKE 1 TABLET (5 MG) BY ORAL ROUTE 2 TIMES PER DAY  . pravastatin (PRAVACHOL) 20 MG tablet TAKE 1 TABLET BY MOUTH EVERYDAY AT BEDTIME  . TURMERIC PO Take 1 tablet by mouth 2 (two) times daily.    No facility-administered encounter medications on file as of 02/22/2021.    Allergies (verified) Patient has no known allergies.   History: Past Medical History:  Diagnosis Date  .  Allergy   . Arthritis   . Cancer ([place])    skin  . Diffuse cystic mastopathy 2013  . High cholesterol   . Hypertension 1990's  . Migraine   . Migraines   . Overactive bladder   . Urinary incontinence    Past Surgical History:  Procedure Laterality Date  . BREAST BIOPSY Right 05/31/93   neg/lump  . COLONOSCOPY  11/2004   Dr. Chauncey [place]  . COLONOSCOPY  WITH PROPOFOL N/A 10/15/2016   Procedure: COLONOSCOPY WITH PROPOFOL;  Surgeon: Christene Lye, MD;  Location: [facility] ENDOSCOPY;  Service: Endoscopy;  Laterality: N/A;  . KIDNEY SURGERY  1998  . LITHOTRIPSY  1998  . shin surgery Left 02/04/2021   Family History  Problem Relation Age of Onset  . Lung cancer Father   . Stroke Mother   . Hypertension Sister   . Arthritis Sister   . Dementia Sister   . Breast cancer Neg Hx    Social History   Socioeconomic History  . Marital status: Widowed    Spouse name: Not on file  . Number of children: Not on file  . Years of education: Not on file  . Highest education level: 12th grade  Occupational History  . Occupation: retired  Tobacco Use  . Smoking status: Never Smoker  . Smokeless tobacco: Never Used  Vaping Use  . Vaping Use: Never used  Substance and Sexual Activity  . Alcohol use: No  . Drug use: No  . Sexual activity: Not Currently  Other Topics Concern  . Not on file  Social History Narrative  . Not on file   Social Determinants of Health   Financial Resource Strain: Low Risk   . Difficulty of Paying Living Expenses: Not hard at all  Food Insecurity: No Food Insecurity  . Worried About Charity fundraiser in the Last Year: Never true  . Ran Out of Food in the Last Year: Never true  Transportation Needs: No Transportation Needs  . Lack of Transportation (Medical): No  . Lack of Transportation (Non-Medical): No  Physical Activity: Inactive  . Days of Exercise per Week: 0 days  . Minutes of Exercise per Session: 0 min  Stress: No Stress Concern Present  . Feeling of Stress : Not at all  Social Connections: Not on file    Tobacco Counseling Counseling given: Not Answered   Clinical Intake:  Pre-visit preparation completed: Yes  Pain : No/denies pain     Nutritional Status: BMI > 30  Obese Nutritional Risks: None Diabetes: No  How often do you need to have someone help you when you read  instructions, pamphlets, or other written materials from your doctor or pharmacy?: 1 - Never What is the last grade level you completed in school?: 12th grade  Diabetic? no  Interpreter Needed?: No  Information entered by :: NAllen LPN   Activities of Daily Living In your present state of health, do you have any difficulty performing the following activities: 02/22/2021  Hearing? N  Vision? N  Difficulty concentrating or making decisions? N  Walking or climbing stairs? Y  Dressing or bathing? N  Doing errands, shopping? N  Preparing Food and eating ? N  Using the Toilet? N  In the past six months, have you accidently leaked urine? Y  Comment wears a pad  Do you have problems with loss of bowel control? N  Managing your Medications? N  Managing your Finances? N  Housekeeping or managing your Housekeeping? N  Some recent data might be hidden    Patient Care Team: Valerie Roys, DO as PCP - General (Family Medicine) Rico Junker, RN as Registered Nurse Kem Parkinson, MD (Ophthalmology) Silvano Bilis, PA (Dermatology) Jannet Mantis, MD (Dermatology)  Indicate any recent Medical Services you may have received from other than [facility] providers in the past year (date may be approximate).     Assessment:   This is a routine wellness examination for [place].  Hearing/Vision screen No exam data present  Dietary issues and exercise activities discussed: Current Exercise Habits: The patient does not participate in regular exercise at present  Goals    . DIET - INCREASE WATER INTAKE     Recommend continue drinking at least 6-8 glasses of water a day     . Patient Stated     02/22/2021, no goals      Depression Screen PHQ 2/9 Scores 02/22/2021 09/10/2020 02/13/2020 08/22/2019 02/10/2019 08/09/2018 02/01/2018  PHQ - 2 Score 0 0 0 0 0 0 0  PHQ- 9 Score - 0 - 0 - 0 -    Fall Risk Fall Risk  02/22/2021 09/10/2020 02/13/2020 02/10/2019 12/08/2018  Falls in the past  year? 0 0 0 0 0  Number falls in past yr: - 0 0 - -  Injury with Fall? - 0 0 - -  Comment - - - - -  Risk Factor Category  - - - - -  Risk for fall due to : Medication side effect - - - -  Follow up Falls evaluation completed;Education provided;Falls prevention discussed Falls evaluation completed - - Falls evaluation completed    FALL RISK PREVENTION PERTAINING TO THE HOME:  Any stairs in or around the home? Yes  If so, are there any without handrails? No  Home free of loose throw rugs in walkways, pet beds, electrical cords, etc? Yes  Adequate lighting in your home to reduce risk of falls? Yes   ASSISTIVE DEVICES UTILIZED TO PREVENT FALLS:  Life alert? No  Use of a cane, walker or w/c? Yes  Grab bars in the bathroom? No  Shower chair or bench in shower? Yes  Elevated toilet seat or a handicapped toilet? Yes   TIMED UP AND GO:  Was the test performed? No .   Cognitive Function:     6CIT Screen 02/22/2021 02/10/2019 02/10/2019 02/01/2018 02/02/2017  What Year? 0 points 0 points 0 points 0 points 0 points  What month? 0 points 0 points 0 points 0 points 0 points  What time? 0 points 0 points 0 points 0 points 0 points  Count back from 20 0 points 0 points 0 points 0 points 0 points  Months in reverse 0 points 0 points 0 points 0 points 0 points  Repeat phrase 0 points 0 points 0 points 0 points 4 points  Total Score 0 0 0 0 4    Immunizations Immunization History  Administered Date(s) Administered  . Fluad Quad(high Dose 65+) 08/22/2019, 09/10/2020  . Influenza, High Dose Seasonal PF 08/25/2016, 08/07/2017, 08/09/2018  . Influenza-Unspecified 08/31/2015  . PFIZER(Purple Top)SARS-COV-2 Vaccination 01/01/2020, 01/25/2020  . Pneumococcal Conjugate-13 01/18/2016  . Pneumococcal Polysaccharide-23 02/02/2017  . Td 01/18/2016    TDAP status: Up to date  Flu Vaccine status: Up to date  Pneumococcal vaccine status: Up to date  Covid-19 vaccine status: Completed  vaccines  Qualifies for Shingles Vaccine? Yes   Zostavax completed No   Shingrix Completed?: No.    Education  has been provided regarding the importance of this vaccine. Patient has been advised to call insurance company to determine out of pocket expense if they have not yet received this vaccine. Advised may also receive vaccine at local pharmacy or Health Dept. Verbalized acceptance and understanding.  Screening Tests Health Maintenance  Topic Date Due  . COVID-19 Vaccine (3 - Booster for Pfizer series) 07/27/2020  . INFLUENZA VACCINE  06/17/2021  . MAMMOGRAM  04/30/2022  . TETANUS/TDAP  01/17/2026  . COLONOSCOPY (Pts 45-41yrs Insurance coverage will need to be confirmed)  10/15/2026  . DEXA SCAN  Completed  . Hepatitis C Screening  Completed  . PNA vac Low Risk Adult  Completed  . HPV VACCINES  Aged Out    Health Maintenance  Health Maintenance Due  Topic Date Due  . COVID-19 Vaccine (3 - Booster for Pfizer series) 07/27/2020    Colorectal cancer screening: Type of screening: Colonoscopy. Completed 10/15/2016. Repeat every 10 years  Mammogram status: 05/01/2021  Bone Density status: 05/01/2021  Lung Cancer Screening: (Low Dose CT Chest recommended if Age 27-80 years, 30 pack-year currently smoking OR have quit w/in 15years.) does not qualify.   Lung Cancer Screening Referral: no  Additional Screening:  Hepatitis C Screening: does qualify; Completed 02/02/2017  Vision Screening: Recommended annual ophthalmology exams for early detection of glaucoma and other disorders of the eye. Is the patient up to date with their annual eye exam?  No  Who is the provider or what is the name of the office in which the patient attends annual eye exams? none If pt is not established with a provider, would they like to be referred to a provider to establish care? No .   Dental Screening: Recommended annual dental exams for proper oral hygiene  Community Resource Referral / Chronic Care  Management: CRR required this visit?  No   CCM required this visit?  No      Plan:     I have personally reviewed and noted the following in the patient's chart:   . Medical and social history . Use of alcohol, tobacco or illicit drugs  . Current medications and supplements . Functional ability and status . Nutritional status . Physical activity . Advanced directives . List of other physicians . Hospitalizations, surgeries, and ER visits in previous 12 months . Vitals . Screenings to include cognitive, depression, and falls . Referrals and appointments  In addition, I have reviewed and discussed with patient certain preventive protocols, quality metrics, and best practice recommendations. A written personalized care plan for preventive services as well as general preventive health recommendations were provided to patient.     Kellie Simmering, LPN   01/19/3613   Nurse Notes:

## 2021-03-11 ENCOUNTER — Ambulatory Visit (INDEPENDENT_AMBULATORY_CARE_PROVIDER_SITE_OTHER): Payer: Medicare HMO | Admitting: Family Medicine

## 2021-03-11 ENCOUNTER — Encounter: Payer: Self-pay | Admitting: Family Medicine

## 2021-03-11 ENCOUNTER — Other Ambulatory Visit: Payer: Self-pay

## 2021-03-11 VITALS — BP 130/72 | HR 86 | Temp 98.3°F | Wt 278.8 lb

## 2021-03-11 DIAGNOSIS — E7800 Pure hypercholesterolemia, unspecified: Secondary | ICD-10-CM | POA: Diagnosis not present

## 2021-03-11 DIAGNOSIS — D692 Other nonthrombocytopenic purpura: Secondary | ICD-10-CM | POA: Insufficient documentation

## 2021-03-11 DIAGNOSIS — I10 Essential (primary) hypertension: Secondary | ICD-10-CM | POA: Diagnosis not present

## 2021-03-11 MED ORDER — LISINOPRIL-HYDROCHLOROTHIAZIDE 20-25 MG PO TABS: 1.0000 | ORAL_TABLET | Freq: Every day | ORAL | 1 refills | Status: DC

## 2021-03-11 MED ORDER — PRAVASTATIN SODIUM 20 MG PO TABS: ORAL_TABLET | ORAL | 1 refills | Status: DC

## 2021-03-11 MED ORDER — CYCLOBENZAPRINE HCL 5 MG PO TABS: 5.0000 mg | ORAL_TABLET | Freq: Every day | ORAL | 0 refills | Status: DC

## 2021-03-11 NOTE — Assessment & Plan Note (Signed)
Reassured patient. Continue to monitor.  

## 2021-03-11 NOTE — Assessment & Plan Note (Signed)
Under good control on current regimen. Continue current regimen. Continue to monitor. Call with any concerns. Refills given. Labs drawn today.   

## 2021-03-11 NOTE — Progress Notes (Signed)
BP 130/72   Pulse 86   Temp 98.3 F (36.8 C) (Oral)   Wt 278 lb 12.8 oz (126.5 kg)   SpO2 97%   BMI 47.86 kg/m    Subjective:    Patient ID: Felicia Vargas, female    DOB: Mar 31, 1946, 75 y.o.   MRN: [id number]  HPI: Felicia Vargas is a 75 y.o. female  Chief Complaint  Patient presents with  . Hyperlipidemia  . Hypertension   HYPERTENSION / [place] Satisfied with current treatment? yes Duration of hypertension: chronic BP monitoring frequency: not checking BP medication side effects: no Past BP meds: lisinopril- HCTZ Duration of hyperlipidemia: chronic Cholesterol medication side effects: no Cholesterol supplements: fish oil Past cholesterol medications: pravastatin Medication compliance: excellent compliance Aspirin: yes Recent stressors: no Recurrent headaches: no Visual changes: no Palpitations: no Dyspnea: no Chest pain: no Lower extremity edema: no Dizzy/lightheaded: no  Relevant past medical, surgical, family and social history reviewed and updated as indicated. Interim medical history since our last visit reviewed. Allergies and medications reviewed and updated.  Review of Systems  Constitutional: Negative.   Respiratory: Negative.   Cardiovascular: Negative.   Gastrointestinal: Negative.   Musculoskeletal: Negative.   Psychiatric/Behavioral: Negative.     Per HPI unless specifically indicated above     Objective:    BP 130/72   Pulse 86   Temp 98.3 F (36.8 C) (Oral)   Wt 278 lb 12.8 oz (126.5 kg)   SpO2 97%   BMI 47.86 kg/m   Wt Readings from Last 3 Encounters:  03/11/21 278 lb 12.8 oz (126.5 kg)  02/22/21 267 lb (121.1 kg)  09/10/20 272 lb (123.4 kg)    Physical Exam Vitals and nursing note reviewed.  Constitutional:      General: She is not in acute distress.    Appearance: Normal appearance. She is not ill-appearing, toxic-appearing or diaphoretic.  HENT:     Head: Normocephalic and atraumatic.     Right Ear:  External ear normal.     Left Ear: External ear normal.     Nose: Nose normal.     Mouth/Throat:     Mouth: Mucous membranes are moist.     Pharynx: Oropharynx is clear.  Eyes:     General: No scleral icterus.       Right eye: No discharge.        Left eye: No discharge.     Extraocular Movements: Extraocular movements intact.     Conjunctiva/sclera: Conjunctivae normal.     Pupils: Pupils are equal, round, and reactive to light.  Cardiovascular:     Rate and Rhythm: Normal rate and regular rhythm.     Pulses: Normal pulses.     Heart sounds: Normal heart sounds. No murmur heard. No friction rub. No gallop.   Pulmonary:     Effort: Pulmonary effort is normal. No respiratory distress.     Breath sounds: Normal breath sounds. No stridor. No wheezing, rhonchi or rales.  Chest:     Chest wall: No tenderness.  Musculoskeletal:        General: Normal range of motion.     Cervical back: Normal range of motion and neck supple.  Skin:    General: Skin is warm and dry.     Capillary Refill: Capillary refill takes less than 2 seconds.     Coloration: Skin is not jaundiced or pale.     Findings: No bruising, erythema, lesion or rash.  Neurological:  General: No focal deficit present.     Mental Status: She is alert and oriented to person, place, and time. Mental status is at baseline.  Psychiatric:        Mood and Affect: Mood normal.        Behavior: Behavior normal.        Thought Content: Thought content normal.        Judgment: Judgment normal.     Results for orders placed or performed in visit on 09/10/20  CBC with Differential/Platelet  Result Value Ref Range   WBC 5.7 3.4 - 10.8 x10E3/uL   RBC 3.74 (L) 3.77 - 5.28 x10E6/uL   Hemoglobin 12.2 11.1 - 15.9 g/dL   Hematocrit 36.1 34.0 - 46.6 %   MCV 97 79 - 97 fL   MCH 32.6 26.6 - 33.0 pg   MCHC 33.8 31.5 - 35.7 g/dL   RDW 12.1 11.7 - 15.4 %   Platelets 236 150 - 450 x10E3/uL   Neutrophils 64 Not Estab. %   Lymphs 23  Not Estab. %   Monocytes 9 Not Estab. %   Eos 2 Not Estab. %   Basos 2 Not Estab. %   Neutrophils Absolute 3.6 1.4 - 7.0 x10E3/uL   Lymphocytes Absolute 1.3 0.7 - 3.1 x10E3/uL   Monocytes Absolute 0.5 0.1 - 0.9 x10E3/uL   EOS (ABSOLUTE) 0.1 0.0 - 0.4 x10E3/uL   Basophils Absolute 0.1 0.0 - 0.2 x10E3/uL   Immature Granulocytes 0 Not Estab. %   Immature Grans (Abs) 0.0 0.0 - 0.1 x10E3/uL  Comprehensive metabolic panel  Result Value Ref Range   Glucose 94 65 - 99 mg/dL   BUN 19 8 - 27 mg/dL   Creatinine, Ser 0.89 0.57 - 1.00 mg/dL   GFR calc non Af Amer 65 >59 mL/min/1.73   GFR calc Af Amer 75 >59 mL/min/1.73   BUN/Creatinine Ratio 21 12 - 28   Sodium 140 134 - 144 mmol/L   Potassium 4.1 3.5 - 5.2 mmol/L   Chloride 104 96 - 106 mmol/L   CO2 22 20 - 29 mmol/L   Calcium 9.6 8.7 - 10.3 mg/dL   Total Protein 6.5 6.0 - 8.5 g/dL   Albumin 4.0 3.7 - 4.7 g/dL   Globulin, Total 2.5 1.5 - 4.5 g/dL   Albumin/Globulin Ratio 1.6 1.2 - 2.2   Bilirubin Total 0.6 0.0 - 1.2 mg/dL   Alkaline Phosphatase 90 44 - 121 IU/L   AST 26 0 - 40 IU/L   ALT 20 0 - 32 IU/L  Lipid Panel w/o Chol/HDL Ratio  Result Value Ref Range   Cholesterol, Total 159 100 - 199 mg/dL   Triglycerides 67 0 - 149 mg/dL   HDL 50 >39 mg/dL   VLDL Cholesterol Cal 13 5 - 40 mg/dL   LDL Chol Calc (NIH) 96 0 - 99 mg/dL  Microalbumin, Urine Waived  Result Value Ref Range   Microalb, Ur Waived 10 0 - 19 mg/L   Creatinine, Urine Waived 50 10 - 300 mg/dL   Microalb/Creat Ratio <30 <30 mg/g  TSH  Result Value Ref Range   TSH 2.770 0.450 - 4.500 uIU/mL  Urinalysis, Routine w reflex microscopic  Result Value Ref Range   Specific Gravity, UA <1.005 (L) 1.005 - 1.030   pH, UA 5.5 5.0 - 7.5   Color, UA Yellow Yellow   Appearance Ur Clear Clear   Leukocytes,UA Negative Negative   Protein,UA Negative Negative/Trace   Glucose, UA Negative Negative  Ketones, UA Negative Negative   RBC, UA Negative Negative   Bilirubin, UA  Negative Negative   Urobilinogen, Ur 0.2 0.2 - 1.0 mg/dL   Nitrite, UA Negative Negative      Assessment & Plan:   Problem List Items Addressed This Visit      Cardiovascular and Mediastinum   Hypertension - Primary    Under good control on current regimen. Continue current regimen. Continue to monitor. Call with any concerns. Refills given. Labs drawn today.       Relevant Medications   pravastatin (PRAVACHOL) 20 MG tablet   lisinopril-hydrochlorothiazide (ZESTORETIC) 20-25 MG tablet   Other Relevant Orders   Comprehensive metabolic panel   Senile purpura ([place])    Reassured patient. Continue to monitor.       Relevant Medications   pravastatin (PRAVACHOL) 20 MG tablet   lisinopril-hydrochlorothiazide (ZESTORETIC) 20-25 MG tablet     Other   High cholesterol    Under good control on current regimen. Continue current regimen. Continue to monitor. Call with any concerns. Refills given. Labs drawn today.       Relevant Medications   pravastatin (PRAVACHOL) 20 MG tablet   lisinopril-hydrochlorothiazide (ZESTORETIC) 20-25 MG tablet   Other Relevant Orders   Comprehensive metabolic panel   Lipid Panel w/o Chol/HDL Ratio       Follow up plan: Return in about 6 months (around 09/10/2021) for physical.

## 2021-03-12 LAB — COMPREHENSIVE METABOLIC PANEL
ALT: 17 IU/L (ref 0–32)
AST: 17 IU/L (ref 0–40)
Albumin/Globulin Ratio: 1.5 (ref 1.2–2.2)
Albumin: 4 g/dL (ref 3.7–4.7)
Alkaline Phosphatase: 101 IU/L (ref 44–121)
BUN/Creatinine Ratio: 29 — ABNORMAL HIGH (ref 12–28)
BUN: 25 mg/dL (ref 8–27)
Bilirubin Total: 0.4 mg/dL (ref 0.0–1.2)
CO2: 20 mmol/L (ref 20–29)
Calcium: 9.4 mg/dL (ref 8.7–10.3)
Chloride: 105 mmol/L (ref 96–106)
Creatinine, Ser: 0.85 mg/dL (ref 0.57–1.00)
Globulin, Total: 2.6 g/dL (ref 1.5–4.5)
Glucose: 108 mg/dL — ABNORMAL HIGH (ref 65–99)
Potassium: 4 mmol/L (ref 3.5–5.2)
Sodium: 141 mmol/L (ref 134–144)
Total Protein: 6.6 g/dL (ref 6.0–8.5)
eGFR: 72 mL/min/{1.73_m2} (ref >59)

## 2021-03-12 LAB — LIPID PANEL W/O CHOL/HDL RATIO
Cholesterol, Total: 163 mg/dL (ref 100–199)
HDL: 46 mg/dL (ref >39)
LDL Chol Calc (NIH): 94 mg/dL (ref 0–99)
Triglycerides: 131 mg/dL (ref 0–149)
VLDL Cholesterol Cal: 23 mg/dL (ref 5–40)

## 2021-04-30 DIAGNOSIS — L249 Irritant contact dermatitis, unspecified cause: Secondary | ICD-10-CM | POA: Diagnosis not present

## 2021-04-30 DIAGNOSIS — L821 Other seborrheic keratosis: Secondary | ICD-10-CM | POA: Diagnosis not present

## 2021-05-01 ENCOUNTER — Ambulatory Visit
Admission: RE | Admit: 2021-05-01 | Discharge: 2021-05-01 | Disposition: A | Discharge status: Home / Self Care | Payer: Medicare HMO | Source: Ambulatory Visit | Attending: Family Medicine | Admitting: Family Medicine

## 2021-05-01 ENCOUNTER — Other Ambulatory Visit: Payer: Self-pay

## 2021-05-01 DIAGNOSIS — Z1231 Encounter for screening mammogram for malignant neoplasm of breast: Secondary | ICD-10-CM

## 2021-05-01 DIAGNOSIS — Z13820 Encounter for screening for osteoporosis: Secondary | ICD-10-CM | POA: Diagnosis not present

## 2021-05-01 DIAGNOSIS — M85852 Other specified disorders of bone density and structure, left thigh: Secondary | ICD-10-CM | POA: Diagnosis not present

## 2021-05-01 DIAGNOSIS — M8589 Other specified disorders of bone density and structure, multiple sites: Secondary | ICD-10-CM | POA: Diagnosis not present

## 2021-05-01 DIAGNOSIS — M85832 Other specified disorders of bone density and structure, left forearm: Secondary | ICD-10-CM | POA: Diagnosis not present

## 2021-05-01 DIAGNOSIS — Z780 Asymptomatic menopausal state: Secondary | ICD-10-CM | POA: Insufficient documentation

## 2021-06-04 DIAGNOSIS — M1712 Unilateral primary osteoarthritis, left knee: Secondary | ICD-10-CM | POA: Diagnosis not present

## 2021-06-17 ENCOUNTER — Other Ambulatory Visit: Payer: Self-pay | Admitting: Family Medicine

## 2021-06-17 NOTE — Telephone Encounter (Signed)
Patient was seen in April and has appointment in October.

## 2021-06-17 NOTE — Telephone Encounter (Signed)
Requested medication (s) are due for refill today: Yes  Requested medication (s) are on the active medication list: Yes  Last refill:  03/11/21  Future visit scheduled: Yes  Notes to clinic:  See request.    Requested Prescriptions  Pending Prescriptions Disp Refills   cyclobenzaprine (FLEXERIL) 5 MG tablet [Pharmacy Med Name: CYCLOBENZAPRINE 5 MG TABLET] 180 tablet 0    Sig: Take 1-2 tablets (5-10 mg total) by mouth at bedtime.      Not Delegated - Analgesics:  Muscle Relaxants Failed - 06/17/2021 10:46 AM      Failed - This refill cannot be delegated      Passed - Valid encounter within last 6 months    Recent Outpatient Visits           3 months ago Primary hypertension   [place], Megan P, DO   9 months ago Routine general medical examination at a health care facility   [facility], [place] P, DO   1 year ago Acute pain of left knee   [place], [place], DO   1 year ago Essential hypertension   [place], [place], DO   1 year ago Routine general medical examination at a health care facility   [place], Barb Merino, DO       Future Appointments             In 2 months Wynetta Emery, Barb Merino, DO [place], [place]   In 8 months  [employer], [place]

## 2021-07-05 ENCOUNTER — Telehealth: Payer: Self-pay

## 2021-07-05 NOTE — Telephone Encounter (Signed)
PA for Cyclobenazeprine initiated and submitted via Cover My Meds. Key: [id number]

## 2021-07-08 DIAGNOSIS — Z872 Personal history of diseases of the skin and subcutaneous tissue: Secondary | ICD-10-CM | POA: Diagnosis not present

## 2021-07-08 DIAGNOSIS — L570 Actinic keratosis: Secondary | ICD-10-CM | POA: Diagnosis not present

## 2021-07-08 DIAGNOSIS — Z859 Personal history of malignant neoplasm, unspecified: Secondary | ICD-10-CM | POA: Diagnosis not present

## 2021-07-08 DIAGNOSIS — L578 Other skin changes due to chronic exposure to nonionizing radiation: Secondary | ICD-10-CM | POA: Diagnosis not present

## 2021-09-10 ENCOUNTER — Encounter: Payer: Self-pay | Admitting: Family Medicine

## 2021-09-10 ENCOUNTER — Other Ambulatory Visit: Payer: Self-pay

## 2021-09-10 ENCOUNTER — Ambulatory Visit (INDEPENDENT_AMBULATORY_CARE_PROVIDER_SITE_OTHER): Payer: Medicare HMO | Admitting: Family Medicine

## 2021-09-10 VITALS — BP 128/79 | HR 67 | Ht 64.0 in | Wt 278.0 lb

## 2021-09-10 DIAGNOSIS — D692 Other nonthrombocytopenic purpura: Secondary | ICD-10-CM

## 2021-09-10 DIAGNOSIS — E7800 Pure hypercholesterolemia, unspecified: Secondary | ICD-10-CM | POA: Diagnosis not present

## 2021-09-10 DIAGNOSIS — N3281 Overactive bladder: Secondary | ICD-10-CM | POA: Diagnosis not present

## 2021-09-10 DIAGNOSIS — I10 Essential (primary) hypertension: Secondary | ICD-10-CM

## 2021-09-10 DIAGNOSIS — Z23 Encounter for immunization: Secondary | ICD-10-CM | POA: Diagnosis not present

## 2021-09-10 DIAGNOSIS — Z0000 Encounter for general adult medical examination without abnormal findings: Secondary | ICD-10-CM

## 2021-09-10 LAB — URINALYSIS, ROUTINE W REFLEX MICROSCOPIC
Bilirubin, UA: NEGATIVE
Glucose, UA: NEGATIVE
Ketones, UA: NEGATIVE
Nitrite, UA: NEGATIVE
Protein,UA: NEGATIVE
RBC, UA: NEGATIVE
Specific Gravity, UA: 1.015 (ref 1.005–1.030)
Urobilinogen, Ur: 0.2 mg/dL (ref 0.2–1.0)
pH, UA: 5.5 (ref 5.0–7.5)

## 2021-09-10 LAB — MICROSCOPIC EXAMINATION: RBC, Urine: NONE SEEN /hpf (ref 0–2)

## 2021-09-10 LAB — MICROALBUMIN, URINE WAIVED
Creatinine, Urine Waived: 100 mg/dL (ref 10–300)
Microalb, Ur Waived: 30 mg/L — ABNORMAL HIGH (ref 0–19)
Microalb/Creat Ratio: <30 mg/g (ref <30)

## 2021-09-10 MED ORDER — OXYBUTYNIN CHLORIDE 5 MG PO TABS: ORAL_TABLET | ORAL | 3 refills | Status: DC

## 2021-09-10 MED ORDER — CYCLOBENZAPRINE HCL 5 MG PO TABS: 5.0000 mg | ORAL_TABLET | Freq: Every day | ORAL | 0 refills | Status: DC

## 2021-09-10 MED ORDER — LISINOPRIL-HYDROCHLOROTHIAZIDE 20-25 MG PO TABS: 1.0000 | ORAL_TABLET | Freq: Every day | ORAL | 1 refills | Status: DC

## 2021-09-10 MED ORDER — PRAVASTATIN SODIUM 20 MG PO TABS: ORAL_TABLET | ORAL | 1 refills | Status: DC

## 2021-09-10 NOTE — Assessment & Plan Note (Signed)
Under good control on current regimen. Continue current regimen. Continue to monitor. Call with any concerns. Refills given. Labs drawn today.   

## 2021-09-10 NOTE — Progress Notes (Signed)
BP 128/79   Pulse 67   Ht 5' 4"  (1.626 m)   Wt 278 lb (126.1 kg)   BMI 47.72 kg/m    Subjective:    Patient ID: Felicia Vargas, female    DOB: [birth year], 75 y.o.   MRN: [id number]  HPI: Felicia Vargas is a 75 y.o. female presenting on 09/10/2021 for comprehensive medical examination. Current medical complaints include:  HYPERTENSION / HYPERLIPIDEMIA Satisfied with current treatment? yes Duration of hypertension: chronic BP monitoring frequency: not checking BP medication side effects: no Past BP meds: lisinopril-HCTZ Duration of hyperlipidemia: chronic Cholesterol medication side effects: no Cholesterol supplements: none Past cholesterol medications: pravastatin Medication compliance: excellent compliance Aspirin: yes Recent stressors: no Recurrent headaches: no Visual changes: no Palpitations: no Dyspnea: no Chest pain: no Lower extremity edema: no Dizzy/lightheaded: no  She currently lives with: daughter Menopausal Symptoms: no  Depression Screen done today and results listed below:  Depression screen [facility] 2/9 09/10/2021 09/10/2021 02/22/2021 09/10/2020 02/13/2020  Decreased Interest 0 0 0 0 0  Down, Depressed, Hopeless 0 0 0 0 0  PHQ - 2 Score 0 0 0 0 0  Altered sleeping 0 0 - 0 -  Tired, decreased energy 1 0 - 0 -  Change in appetite 0 0 - 0 -  Feeling bad or failure about yourself  0 0 - 0 -  Trouble concentrating 0 0 - 0 -  Moving slowly or fidgety/restless 0 0 - 0 -  Suicidal thoughts 0 0 - 0 -  PHQ-9 Score 1 0 - 0 -  Difficult doing work/chores - Not difficult at all - Not difficult at all -    Past Medical History:  Past Medical History:  Diagnosis Date   Allergy    Arthritis    Cancer ([place])    skin   Diffuse cystic mastopathy 2013   High cholesterol    Hypertension 1990's   Migraine    Migraines    Overactive bladder    Urinary incontinence     Surgical History:  Past Surgical History:  Procedure Laterality Date   BREAST BIOPSY Right  05/31/93   neg/lump   COLONOSCOPY  11/2004   Dr. Chauncey [place]   COLONOSCOPY WITH PROPOFOL N/A 10/15/2016   Procedure: COLONOSCOPY WITH PROPOFOL;  Surgeon: Christene Lye, MD;  Location: [facility] ENDOSCOPY;  Service: Endoscopy;  Laterality: N/A;   KIDNEY SURGERY  1998   LITHOTRIPSY  1998   shin surgery Left 02/04/2021   SQUAMOUS CELL CARCINOMA EXCISION  02/04/2021   left leg    Medications:  Current Outpatient Medications on File Prior to Visit  Medication Sig   aspirin 81 MG tablet Take 81 mg by mouth daily.   calcium carbonate (OS-CAL) 600 MG TABS Take 600 mg by mouth daily with breakfast.    Cholecalciferol (VITAMIN D PO) Take 1 capsule by mouth daily.   docusate sodium (COLACE) 100 MG capsule Take 100 mg by mouth daily.    fluticasone (FLONASE) 50 MCG/ACT nasal spray Place 1 spray into both nostrils daily.   ketoconazole (NIZORAL) 2 % cream Apply topically 2 (two) times daily.   loratadine (CLARITIN) 10 MG tablet Take 10 mg by mouth daily.   meloxicam (MOBIC) 15 MG tablet Take 15 mg by mouth daily.   NAPROXEN PO Take by mouth daily.   Omega-3 Fatty Acids (FISH OIL PO) Take by mouth in the morning and at bedtime.   TURMERIC PO Take 1 tablet by mouth 2 (two) times  daily.    No current facility-administered medications on file prior to visit.    Allergies:  No Known Allergies  Social History:  Social History   Socioeconomic History   Marital status: Widowed    Spouse name: Not on file   Number of children: Not on file   Years of education: Not on file   Highest education level: 12th grade  Occupational History   Occupation: retired  Tobacco Use   Smoking status: Never   Smokeless tobacco: Never  Vaping Use   Vaping Use: Never used  Substance and Sexual Activity   Alcohol use: No   Drug use: No   Sexual activity: Not Currently  Other Topics Concern   Not on file  Social History Narrative   Not on file   Social Determinants of Health   Financial Resource Strain:  Low Risk    Difficulty of Paying Living Expenses: Not hard at all  Food Insecurity: No Food Insecurity   Worried About Charity fundraiser in the Last Year: Never true   [place] in the Last Year: Never true  Transportation Needs: No Transportation Needs   Lack of Transportation (Medical): No   Lack of Transportation (Non-Medical): No  Physical Activity: Inactive   Days of Exercise per Week: 0 days   Minutes of Exercise per Session: 0 min  Stress: No Stress Concern Present   Feeling of Stress : Not at all  Social Connections: Not on file  Intimate Partner Violence: Not on file   Social History   Tobacco Use  Smoking Status Never  Smokeless Tobacco Never   Social History   Substance and Sexual Activity  Alcohol Use No    Family History:  Family History  Problem Relation Age of Onset   Lung cancer Father    Stroke Mother    Hypertension Sister    Arthritis Sister    Dementia Sister    Breast cancer Neg Hx     Past medical history, surgical history, medications, allergies, family history and social history reviewed with patient today and changes made to appropriate areas of the chart.   Review of Systems  Constitutional: Negative.   HENT: Negative.    Eyes: Negative.   Respiratory: Negative.    Cardiovascular: Negative.   Gastrointestinal:  Positive for heartburn. Negative for abdominal pain, blood in stool, constipation, diarrhea, melena, nausea and vomiting.  Genitourinary: Negative.   Musculoskeletal: Negative.   Skin: Negative.   Neurological: Negative.   Endo/Heme/Allergies: Negative.   Psychiatric/Behavioral: Negative.    All other ROS negative except what is listed above and in the HPI.      Objective:    BP 128/79   Pulse 67   Ht 5' 4"  (1.626 m)   Wt 278 lb (126.1 kg)   BMI 47.72 kg/m   Wt Readings from Last 3 Encounters:  09/10/21 278 lb (126.1 kg)  03/11/21 278 lb 12.8 oz (126.5 kg)  02/22/21 267 lb (121.1 kg)    Physical  Exam Vitals and nursing note reviewed.  Constitutional:      General: She is not in acute distress.    Appearance: Normal appearance. She is not ill-appearing, toxic-appearing or diaphoretic.  HENT:     Head: Normocephalic and atraumatic.     Right Ear: Tympanic membrane, ear canal and external ear normal. There is no impacted cerumen.     Left Ear: Tympanic membrane, ear canal and external ear normal. There is no impacted  cerumen.     Nose: Nose normal. No congestion or rhinorrhea.     Mouth/Throat:     Mouth: Mucous membranes are moist.     Pharynx: Oropharynx is clear. No oropharyngeal exudate or posterior oropharyngeal erythema.  Eyes:     General: No scleral icterus.       Right eye: No discharge.        Left eye: No discharge.     Extraocular Movements: Extraocular movements intact.     Conjunctiva/sclera: Conjunctivae normal.     Pupils: Pupils are equal, round, and reactive to light.  Neck:     Vascular: No carotid bruit.  Cardiovascular:     Rate and Rhythm: Normal rate and regular rhythm.     Pulses: Normal pulses.     Heart sounds: No murmur heard.   No friction rub. No gallop.  Pulmonary:     Effort: Pulmonary effort is normal. No respiratory distress.     Breath sounds: Normal breath sounds. No stridor. No wheezing, rhonchi or rales.  Chest:     Chest wall: No tenderness.  Abdominal:     General: Abdomen is flat. Bowel sounds are normal. There is no distension.     Palpations: Abdomen is soft. There is no mass.     Tenderness: There is no abdominal tenderness. There is no right CVA tenderness, left CVA tenderness, guarding or rebound.     Hernia: No hernia is present.  Genitourinary:    Comments: Breast and pelvic exams deferred with shared decision making Musculoskeletal:        General: No swelling, tenderness, deformity or signs of injury.     Cervical back: Normal range of motion and neck supple. No rigidity. No muscular tenderness.     Right lower leg: No  edema.     Left lower leg: No edema.  Lymphadenopathy:     Cervical: No cervical adenopathy.  Skin:    General: Skin is warm and dry.     Capillary Refill: Capillary refill takes less than 2 seconds.     Coloration: Skin is not jaundiced or pale.     Findings: No bruising, erythema, lesion or rash.  Neurological:     General: No focal deficit present.     Mental Status: She is alert and oriented to person, place, and time. Mental status is at baseline.     Cranial Nerves: No cranial nerve deficit.     Sensory: No sensory deficit.     Motor: No weakness.     Coordination: Coordination normal.     Gait: Gait normal.     Deep Tendon Reflexes: Reflexes normal.  Psychiatric:        Mood and Affect: Mood normal.        Behavior: Behavior normal.        Thought Content: Thought content normal.        Judgment: Judgment normal.    Results for orders placed or performed in visit on 03/11/21  Comprehensive metabolic panel  Result Value Ref Range   Glucose 108 (H) 65 - 99 mg/dL   BUN 25 8 - 27 mg/dL   Creatinine, Ser 0.85 0.57 - 1.00 mg/dL   eGFR 72 >59 mL/min/1.73   BUN/Creatinine Ratio 29 (H) 12 - 28   Sodium 141 134 - 144 mmol/L   Potassium 4.0 3.5 - 5.2 mmol/L   Chloride 105 96 - 106 mmol/L   CO2 20 20 - 29 mmol/L   Calcium 9.4 8.7 -  10.3 mg/dL   Total Protein 6.6 6.0 - 8.5 g/dL   Albumin 4.0 3.7 - 4.7 g/dL   Globulin, Total 2.6 1.5 - 4.5 g/dL   Albumin/Globulin Ratio 1.5 1.2 - 2.2   Bilirubin Total 0.4 0.0 - 1.2 mg/dL   Alkaline Phosphatase 101 44 - 121 IU/L   AST 17 0 - 40 IU/L   ALT 17 0 - 32 IU/L  Lipid Panel w/o Chol/HDL Ratio  Result Value Ref Range   Cholesterol, Total 163 100 - 199 mg/dL   Triglycerides 131 0 - 149 mg/dL   HDL 46 >39 mg/dL   VLDL Cholesterol Cal 23 5 - 40 mg/dL   LDL Chol Calc (NIH) 94 0 - 99 mg/dL      Assessment & Plan:   Problem List Items Addressed This Visit       Cardiovascular and Mediastinum   Hypertension    Under good control  on current regimen. Continue current regimen. Continue to monitor. Call with any concerns. Refills given. Labs drawn today.       Relevant Medications   pravastatin (PRAVACHOL) 20 MG tablet   lisinopril-hydrochlorothiazide (ZESTORETIC) 20-25 MG tablet   Other Relevant Orders   Microalbumin, Urine Waived (STAT)   TSH   Lipid Profile   CBC w/Diff   Urinalysis, Routine w reflex microscopic   Comprehensive metabolic panel   Senile purpura ([facility])    Stable. Reassured patient. Continue to monitor.       Relevant Medications   pravastatin (PRAVACHOL) 20 MG tablet   lisinopril-hydrochlorothiazide (ZESTORETIC) 20-25 MG tablet     Genitourinary   OAB (overactive bladder)    Under good control on current regimen. Continue current regimen. Continue to monitor. Call with any concerns. Refills given. Labs drawn today.       Relevant Orders   Microalbumin, Urine Waived (STAT)   TSH   Lipid Profile   CBC w/Diff   Urinalysis, Routine w reflex microscopic   Comprehensive metabolic panel     Other   High cholesterol    Under good control on current regimen. Continue current regimen. Continue to monitor. Call with any concerns. Refills given. Labs drawn today.       Relevant Medications   pravastatin (PRAVACHOL) 20 MG tablet   lisinopril-hydrochlorothiazide (ZESTORETIC) 20-25 MG tablet   Other Relevant Orders   Microalbumin, Urine Waived (STAT)   TSH   Lipid Profile   CBC w/Diff   Urinalysis, Routine w reflex microscopic   Comprehensive metabolic panel   Other Visit Diagnoses     Routine general medical examination at a health care facility    -  Primary   Vaccines up to date. Screening labs checked today. Mammogram, DEXA and colonoscopy up to date. Continue diet and exercise. Call with any concerns.   Essential hypertension       Relevant Medications   pravastatin (PRAVACHOL) 20 MG tablet   lisinopril-hydrochlorothiazide (ZESTORETIC) 20-25 MG tablet   Other Relevant Orders    Microalbumin, Urine Waived (STAT)   TSH   Lipid Profile   CBC w/Diff   Urinalysis, Routine w reflex microscopic   Comprehensive metabolic panel   Need for immunization against influenza       Flu shot given today.   Relevant Orders   Flu Vaccine QUAD High Dose(Fluad) (Completed)   Microalbumin, Urine Waived (STAT)   TSH   Lipid Profile   CBC w/Diff   Urinalysis, Routine w reflex microscopic   Comprehensive metabolic panel  Follow up plan: Return in about 6 months (around 03/11/2022).   LABORATORY TESTING:  - Pap smear: not applicable  IMMUNIZATIONS:   - Tdap: Tetanus vaccination status reviewed: last tetanus booster within 10 years. - Influenza: Administered today - Pneumovax: Up to date - Prevnar: Up to date - COVID: Up to date - Shingrix vaccine: Refused  SCREENING: -Mammogram: Up to date  - Colonoscopy: Up to date  - Bone Density: Up to date   PATIENT COUNSELING:   Advised to take 1 mg of folate supplement per day if capable of pregnancy.   Sexuality: Discussed sexually transmitted diseases, partner selection, use of condoms, avoidance of unintended pregnancy  and contraceptive alternatives.   Advised to avoid cigarette smoking.  I discussed with the patient that most people either abstain from alcohol or drink within safe limits (<=14/week and <=4 drinks/occasion for males, <=7/weeks and <= 3 drinks/occasion for females) and that the risk for alcohol disorders and other health effects rises proportionally with the number of drinks per week and how often a drinker exceeds daily limits.  Discussed cessation/primary prevention of drug use and availability of treatment for abuse.   Diet: Encouraged to adjust caloric intake to maintain  or achieve ideal body weight, to reduce intake of dietary saturated fat and total fat, to limit sodium intake by avoiding high sodium foods and not adding table salt, and to maintain adequate dietary potassium and calcium  preferably from fresh fruits, vegetables, and low-fat dairy products.    stressed the importance of regular exercise  Injury prevention: Discussed safety belts, safety helmets, smoke detector, smoking near bedding or upholstery.   Dental health: Discussed importance of regular tooth brushing, flossing, and dental visits.    NEXT PREVENTATIVE PHYSICAL DUE IN 1 YEAR. Return in about 6 months (around 03/11/2022).

## 2021-09-10 NOTE — Assessment & Plan Note (Signed)
Stable. Reassured patient. Continue to monitor.  

## 2021-09-11 LAB — CBC WITH DIFFERENTIAL/PLATELET
Basophils Absolute: 0.1 10*3/uL (ref 0.0–0.2)
Basos: 2 %
EOS (ABSOLUTE): 0.3 10*3/uL (ref 0.0–0.4)
Eos: 6 %
Hematocrit: 37.3 % (ref 34.0–46.6)
Hemoglobin: 12.4 g/dL (ref 11.1–15.9)
Immature Grans (Abs): 0 10*3/uL (ref 0.0–0.1)
Immature Granulocytes: 0 %
Lymphocytes Absolute: 1.3 10*3/uL (ref 0.7–3.1)
Lymphs: 28 %
MCH: 32.7 pg (ref 26.6–33.0)
MCHC: 33.2 g/dL (ref 31.5–35.7)
MCV: 98 fL — ABNORMAL HIGH (ref 79–97)
Monocytes Absolute: 0.5 10*3/uL (ref 0.1–0.9)
Monocytes: 10 %
Neutrophils Absolute: 2.5 10*3/uL (ref 1.4–7.0)
Neutrophils: 54 %
Platelets: 213 10*3/uL (ref 150–450)
RBC: 3.79 x10E6/uL (ref 3.77–5.28)
RDW: 11.8 % (ref 11.7–15.4)
WBC: 4.8 10*3/uL (ref 3.4–10.8)

## 2021-09-11 LAB — COMPREHENSIVE METABOLIC PANEL
ALT: 20 IU/L (ref 0–32)
AST: 23 IU/L (ref 0–40)
Albumin/Globulin Ratio: 1.7 (ref 1.2–2.2)
Albumin: 4 g/dL (ref 3.7–4.7)
Alkaline Phosphatase: 94 IU/L (ref 44–121)
BUN/Creatinine Ratio: 25 (ref 12–28)
BUN: 23 mg/dL (ref 8–27)
Bilirubin Total: 0.6 mg/dL (ref 0.0–1.2)
CO2: 23 mmol/L (ref 20–29)
Calcium: 9.5 mg/dL (ref 8.7–10.3)
Chloride: 103 mmol/L (ref 96–106)
Creatinine, Ser: 0.92 mg/dL (ref 0.57–1.00)
Globulin, Total: 2.4 g/dL (ref 1.5–4.5)
Glucose: 90 mg/dL (ref 70–99)
Potassium: 4.1 mmol/L (ref 3.5–5.2)
Sodium: 140 mmol/L (ref 134–144)
Total Protein: 6.4 g/dL (ref 6.0–8.5)
eGFR: 66 mL/min/{1.73_m2} (ref >59)

## 2021-09-11 LAB — LIPID PANEL
Chol/HDL Ratio: 3.6 ratio (ref 0.0–4.4)
Cholesterol, Total: 175 mg/dL (ref 100–199)
HDL: 48 mg/dL (ref >39)
LDL Chol Calc (NIH): 110 mg/dL — ABNORMAL HIGH (ref 0–99)
Triglycerides: 92 mg/dL (ref 0–149)
VLDL Cholesterol Cal: 17 mg/dL (ref 5–40)

## 2021-09-11 LAB — TSH: TSH: 3.15 u[IU]/mL (ref 0.450–4.500)

## 2021-10-28 ENCOUNTER — Other Ambulatory Visit: Payer: Self-pay | Admitting: Family Medicine

## 2021-10-28 NOTE — Telephone Encounter (Signed)
Requested Prescriptions  Pending Prescriptions Disp Refills  . fluticasone (FLONASE) 50 MCG/ACT nasal spray [Pharmacy Med Name: FLUTICASONE PROP 50 MCG SPRAY] 32 mL 1    Sig: SPRAY 1 SPRAY INTO BOTH NOSTRILS DAILY.     Ear, Nose, and Throat: Nasal Preparations - Corticosteroids Passed - 10/28/2021 11:23 AM      Passed - Valid encounter within last 12 months    Recent Outpatient Visits          1 month ago Routine general medical examination at a health care facility   [place], [place] P, DO   7 months ago Primary hypertension   [place], [place], DO   1 year ago Routine general medical examination at a health care facility   [facility], [place], DO   1 year ago Acute pain of left knee   [place], [place], DO   1 year ago Essential hypertension   [place], Barb Merino, DO      Future Appointments            In 3 months  [place], [place]   In 4 months [place], Barb Merino, DO [employer], [place]

## 2021-10-31 DIAGNOSIS — M170 Bilateral primary osteoarthritis of knee: Secondary | ICD-10-CM | POA: Diagnosis not present

## 2021-11-28 ENCOUNTER — Telehealth: Payer: Self-pay | Admitting: Family Medicine

## 2021-11-28 NOTE — Telephone Encounter (Signed)
Patient states she spoke with someone that was a Industrial/product designer from a [facility] facility and her name was Vicente Males. Whomever the patient spoken with had her confused in regards to her blood pressure medication. Went over medication and refill information with patient and advised her before she speaks with anyone else to be sure to give our office a call back to discuss with clinical staff or provider. Patient verbalized understanding and has no further questions at this time.

## 2021-11-28 NOTE — Telephone Encounter (Signed)
I don't know what this means. I don't have any concerns about her taking her medicine- where did this come from?

## 2021-11-28 NOTE — Telephone Encounter (Signed)
Copied from [place] [id number]. Topic: General - Other >> Nov 28, 2021  1:56 PM Tessa Lerner A wrote: Reason for CRM: The patient has been previously contacted about their medications   The patient has been in contact with their pharmacy and been told that their medications have been filled regularly and they are up to date on their prescriptions  The patient would like to know if the practice has any additional concerns related to their medications   Please contact further if needed

## 2022-01-08 DIAGNOSIS — D485 Neoplasm of uncertain behavior of skin: Secondary | ICD-10-CM | POA: Diagnosis not present

## 2022-01-08 DIAGNOSIS — L814 Other melanin hyperpigmentation: Secondary | ICD-10-CM | POA: Diagnosis not present

## 2022-01-08 DIAGNOSIS — Z872 Personal history of diseases of the skin and subcutaneous tissue: Secondary | ICD-10-CM | POA: Diagnosis not present

## 2022-01-08 DIAGNOSIS — L570 Actinic keratosis: Secondary | ICD-10-CM | POA: Diagnosis not present

## 2022-01-08 DIAGNOSIS — Z859 Personal history of malignant neoplasm, unspecified: Secondary | ICD-10-CM | POA: Diagnosis not present

## 2022-01-08 DIAGNOSIS — D239 Other benign neoplasm of skin, unspecified: Secondary | ICD-10-CM | POA: Diagnosis not present

## 2022-01-08 DIAGNOSIS — D692 Other nonthrombocytopenic purpura: Secondary | ICD-10-CM | POA: Diagnosis not present

## 2022-01-20 DIAGNOSIS — M170 Bilateral primary osteoarthritis of knee: Secondary | ICD-10-CM | POA: Diagnosis not present

## 2022-02-07 DIAGNOSIS — M170 Bilateral primary osteoarthritis of knee: Secondary | ICD-10-CM | POA: Diagnosis not present

## 2022-02-10 NOTE — Progress Notes (Signed)
? ?BP 132/78 (BP Location: Left Arm, Cuff Size: Normal)   Pulse 73   Temp 98.3 ?F (36.8 ?C)   Wt 251 lb (113.9 kg)   SpO2 99%   BMI 43.08 kg/m?   ? ?Subjective:  ? ? Patient ID: Felicia Vargas, female    DOB: 08-17-1946, 76 y.o.   MRN: [id number] ? ?HPI: ?Felicia Vargas is a 76 y.o. female ? ?Chief Complaint  ?Patient presents with  ? Hypertension  ? Hyperlipidemia  ? ?Having lots of issues with her knees and possibly going to have to have surgery. Has been doing PT and has had shots. She has been having a lot of issues getting out of her chairs. She has been able to get out of a hard bottomed chair, but has been having issues with her recliner and arm chair. She notes that her knees are aching and sore.  ? ?HYPERTENSION / HYPERLIPIDEMIA ?Satisfied with current treatment? yes ?Duration of hypertension: chronic ?BP monitoring frequency: not checking ?BP medication side effects: no ?Past BP meds: lisinopril-HCTZ ?Duration of hyperlipidemia: chronic ?Cholesterol medication side effects: no ?Cholesterol supplements: none ?Past cholesterol medications: pravastatin ?Medication compliance: excellent compliance ?Aspirin: yes ?Recent stressors: no ?Recurrent headaches: no ?Visual changes: no ?Palpitations: no ?Dyspnea: no ?Chest pain: no ?Lower extremity edema: no ?Dizzy/lightheaded: no ? ?Relevant past medical, surgical, family and social history reviewed and updated as indicated. Interim medical history since our last visit reviewed. ?Allergies and medications reviewed and updated. ? ?Review of Systems  ?Constitutional: Negative.   ?Respiratory: Negative.    ?Cardiovascular: Negative.   ?Gastrointestinal: Negative.   ?Musculoskeletal:  Positive for arthralgias, gait problem and joint swelling. Negative for back pain, myalgias, neck pain and neck stiffness.  ?Skin: Negative.   ?Psychiatric/Behavioral: Negative.    ? ?Per HPI unless specifically indicated above ? ?   ?Objective:  ?  ?BP 132/78 (BP Location: Left Arm,  Cuff Size: Normal)   Pulse 73   Temp 98.3 ?F (36.8 ?C)   Wt 251 lb (113.9 kg)   SpO2 99%   BMI 43.08 kg/m?   ?Wt Readings from Last 3 Encounters:  ?02/11/22 251 lb (113.9 kg)  ?09/10/21 278 lb (126.1 kg)  ?03/11/21 278 lb 12.8 oz (126.5 kg)  ?  ?Physical Exam ?Vitals and nursing note reviewed.  ?Constitutional:   ?   General: She is not in acute distress. ?   Appearance: Normal appearance. She is not ill-appearing, toxic-appearing or diaphoretic.  ?HENT:  ?   Head: Normocephalic and atraumatic.  ?   Right Ear: External ear normal.  ?   Left Ear: External ear normal.  ?   Nose: Nose normal.  ?   Mouth/Throat:  ?   Mouth: Mucous membranes are moist.  ?   Pharynx: Oropharynx is clear.  ?Eyes:  ?   General: No scleral icterus.    ?   Right eye: No discharge.     ?   Left eye: No discharge.  ?   Extraocular Movements: Extraocular movements intact.  ?   Conjunctiva/sclera: Conjunctivae normal.  ?   Pupils: Pupils are equal, round, and reactive to light.  ?Cardiovascular:  ?   Rate and Rhythm: Normal rate and regular rhythm.  ?   Pulses: Normal pulses.  ?   Heart sounds: Normal heart sounds. No murmur heard. ?  No friction rub. No gallop.  ?Pulmonary:  ?   Effort: Pulmonary effort is normal. No respiratory distress.  ?   Breath sounds:  Normal breath sounds. No stridor. No wheezing, rhonchi or rales.  ?Chest:  ?   Chest wall: No tenderness.  ?Musculoskeletal:     ?   General: Swelling and tenderness present.  ?   Cervical back: Normal range of motion and neck supple.  ?Skin: ?   General: Skin is warm and dry.  ?   Capillary Refill: Capillary refill takes less than 2 seconds.  ?   Coloration: Skin is not jaundiced or pale.  ?   Findings: No bruising, erythema, lesion or rash.  ?Neurological:  ?   General: No focal deficit present.  ?   Mental Status: She is alert and oriented to person, place, and time. Mental status is at baseline.  ?Psychiatric:     ?   Mood and Affect: Mood normal.     ?   Behavior: Behavior normal.      ?   Thought Content: Thought content normal.     ?   Judgment: Judgment normal.  ? ? ?Results for orders placed or performed in visit on 09/10/21  ?Microscopic Examination  ? Urine  ?Result Value Ref Range  ? WBC, UA 0-5 0 - 5 /hpf  ? RBC None seen 0 - 2 /hpf  ? Epithelial Cells (non renal) 0-10 0 - 10 /hpf  ? Mucus, UA Present (A) Not Estab.  ? Bacteria, UA Few (A) None seen/Few  ?Comprehensive metabolic panel  ?Result Value Ref Range  ? Glucose 90 70 - 99 mg/dL  ? BUN 23 8 - 27 mg/dL  ? Creatinine, Ser 0.92 0.57 - 1.00 mg/dL  ? eGFR 66 >59 mL/min/1.73  ? BUN/Creatinine Ratio 25 12 - 28  ? Sodium 140 134 - 144 mmol/L  ? Potassium 4.1 3.5 - 5.2 mmol/L  ? Chloride 103 96 - 106 mmol/L  ? CO2 23 20 - 29 mmol/L  ? Calcium 9.5 8.7 - 10.3 mg/dL  ? Total Protein 6.4 6.0 - 8.5 g/dL  ? Albumin 4.0 3.7 - 4.7 g/dL  ? Globulin, Total 2.4 1.5 - 4.5 g/dL  ? Albumin/Globulin Ratio 1.7 1.2 - 2.2  ? Bilirubin Total 0.6 0.0 - 1.2 mg/dL  ? Alkaline Phosphatase 94 44 - 121 IU/L  ? AST 23 0 - 40 IU/L  ? ALT 20 0 - 32 IU/L  ?Urinalysis, Routine w reflex microscopic  ?Result Value Ref Range  ? Specific Gravity, UA 1.015 1.005 - 1.030  ? pH, UA 5.5 5.0 - 7.5  ? Color, UA Yellow Yellow  ? Appearance Ur Clear Clear  ? Leukocytes,UA 1+ (A) Negative  ? Protein,UA Negative Negative/Trace  ? Glucose, UA Negative Negative  ? Ketones, UA Negative Negative  ? RBC, UA Negative Negative  ? Bilirubin, UA Negative Negative  ? Urobilinogen, Ur 0.2 0.2 - 1.0 mg/dL  ? Nitrite, UA Negative Negative  ? Microscopic Examination See below:   ?Microalbumin, Urine Waived  ?Result Value Ref Range  ? Microalb, Ur Waived 30 (H) 0 - 19 mg/L  ? Creatinine, Urine Waived 100 10 - 300 mg/dL  ? Microalb/Creat Ratio <30 <30 mg/g  ?CBC with Differential/Platelet  ?Result Value Ref Range  ? WBC 4.8 3.4 - 10.8 x10E3/uL  ? RBC 3.79 3.77 - 5.28 x10E6/uL  ? Hemoglobin 12.4 11.1 - 15.9 g/dL  ? Hematocrit 37.3 34.0 - 46.6 %  ? MCV 98 (H) 79 - 97 fL  ? MCH 32.7 26.6 - 33.0 pg   ? MCHC 33.2 31.5 - 35.7 g/dL  ?  RDW 11.8 11.7 - 15.4 %  ? Platelets 213 150 - 450 x10E3/uL  ? Neutrophils 54 Not Estab. %  ? Lymphs 28 Not Estab. %  ? Monocytes 10 Not Estab. %  ? Eos 6 Not Estab. %  ? Basos 2 Not Estab. %  ? Neutrophils Absolute 2.5 1.4 - 7.0 x10E3/uL  ? Lymphocytes Absolute 1.3 0.7 - 3.1 x10E3/uL  ? Monocytes Absolute 0.5 0.1 - 0.9 x10E3/uL  ? EOS (ABSOLUTE) 0.3 0.0 - 0.4 x10E3/uL  ? Basophils Absolute 0.1 0.0 - 0.2 x10E3/uL  ? Immature Granulocytes 0 Not Estab. %  ? Immature Grans (Abs) 0.0 0.0 - 0.1 x10E3/uL  ?Lipid panel  ?Result Value Ref Range  ? Cholesterol, Total 175 100 - 199 mg/dL  ? Triglycerides 92 0 - 149 mg/dL  ? HDL 48 >39 mg/dL  ? VLDL Cholesterol Cal 17 5 - 40 mg/dL  ? LDL Chol Calc (NIH) 110 (H) 0 - 99 mg/dL  ? Chol/HDL Ratio 3.6 0.0 - 4.4 ratio  ?TSH  ?Result Value Ref Range  ? TSH 3.150 0.450 - 4.500 uIU/mL  ? ?   ?Assessment & Plan:  ? ?Problem List Items Addressed This Visit   ? ?  ? Cardiovascular and Mediastinum  ? Hypertension - Primary  ?  Under good control on current regimen. Continue current regimen. Continue to monitor. Call with any concerns. Refills given. Labs drawn today. ? ?  ?  ? Relevant Medications  ? pravastatin (PRAVACHOL) 20 MG tablet  ? lisinopril-hydrochlorothiazide (ZESTORETIC) 20-25 MG tablet  ? Senile purpura ([place])  ?  Stable. Continue to monitor.  ?  ?  ? Relevant Medications  ? pravastatin (PRAVACHOL) 20 MG tablet  ? lisinopril-hydrochlorothiazide (ZESTORETIC) 20-25 MG tablet  ?  ? Musculoskeletal and Integument  ? Primary osteoarthritis of both knees  ?  Continue to follow with orthopedics. Continue PT. Continue to monitor. Call with any concerns. Will get Rx for lift chair. Call with any concerns.  ?  ?  ? Relevant Medications  ? cyclobenzaprine (FLEXERIL) 5 MG tablet  ? Other Relevant Orders  ? Comprehensive metabolic panel  ?  ? Other  ? High cholesterol  ?  Under good control on current regimen. Continue current regimen. Continue to monitor.  Call with any concerns. Refills given. Labs drawn today. ? ?  ?  ? Relevant Medications  ? pravastatin (PRAVACHOL) 20 MG tablet  ? lisinopril-hydrochlorothiazide (ZESTORETIC) 20-25 MG tablet  ? Other Relevant Order

## 2022-02-11 ENCOUNTER — Ambulatory Visit (INDEPENDENT_AMBULATORY_CARE_PROVIDER_SITE_OTHER): Payer: Medicare HMO | Admitting: Family Medicine

## 2022-02-11 ENCOUNTER — Encounter: Payer: Self-pay | Admitting: Family Medicine

## 2022-02-11 ENCOUNTER — Other Ambulatory Visit: Payer: Self-pay

## 2022-02-11 VITALS — BP 132/78 | HR 73 | Temp 98.3°F | Wt 251.0 lb

## 2022-02-11 DIAGNOSIS — E7800 Pure hypercholesterolemia, unspecified: Secondary | ICD-10-CM

## 2022-02-11 DIAGNOSIS — I10 Essential (primary) hypertension: Secondary | ICD-10-CM | POA: Diagnosis not present

## 2022-02-11 DIAGNOSIS — D692 Other nonthrombocytopenic purpura: Secondary | ICD-10-CM | POA: Diagnosis not present

## 2022-02-11 DIAGNOSIS — M170 Bilateral primary osteoarthritis of knee: Secondary | ICD-10-CM

## 2022-02-11 MED ORDER — LISINOPRIL-HYDROCHLOROTHIAZIDE 20-25 MG PO TABS: 1.0000 | ORAL_TABLET | Freq: Every day | ORAL | 1 refills | Status: DC

## 2022-02-11 MED ORDER — CYCLOBENZAPRINE HCL 5 MG PO TABS: 5.0000 mg | ORAL_TABLET | Freq: Every day | ORAL | 0 refills | Status: DC

## 2022-02-11 MED ORDER — PRAVASTATIN SODIUM 20 MG PO TABS: ORAL_TABLET | ORAL | 1 refills | Status: DC

## 2022-02-11 NOTE — Assessment & Plan Note (Signed)
Continue to follow with orthopedics. Continue PT. Continue to monitor. Call with any concerns. Will get Rx for lift chair. Call with any concerns.  ?

## 2022-02-11 NOTE — Patient Instructions (Addendum)
[facility] ?[address], [place], [place] [address] ?Phone: [phone] ?

## 2022-02-11 NOTE — Assessment & Plan Note (Signed)
Stable.       - Continue to monitor

## 2022-02-11 NOTE — Assessment & Plan Note (Signed)
Under good control on current regimen. Continue current regimen. Continue to monitor. Call with any concerns. Refills given. Labs drawn today.   

## 2022-02-12 LAB — COMPREHENSIVE METABOLIC PANEL
ALT: 18 IU/L (ref 0–32)
AST: 17 IU/L (ref 0–40)
Albumin/Globulin Ratio: 1.4 (ref 1.2–2.2)
Albumin: 3.8 g/dL (ref 3.7–4.7)
Alkaline Phosphatase: 84 IU/L (ref 44–121)
BUN/Creatinine Ratio: 26 (ref 12–28)
BUN: 28 mg/dL — ABNORMAL HIGH (ref 8–27)
Bilirubin Total: 0.7 mg/dL (ref 0.0–1.2)
CO2: 18 mmol/L — ABNORMAL LOW (ref 20–29)
Calcium: 9.9 mg/dL (ref 8.7–10.3)
Chloride: 103 mmol/L (ref 96–106)
Creatinine, Ser: 1.09 mg/dL — ABNORMAL HIGH (ref 0.57–1.00)
Globulin, Total: 2.7 g/dL (ref 1.5–4.5)
Glucose: 96 mg/dL (ref 70–99)
Potassium: 3.9 mmol/L (ref 3.5–5.2)
Sodium: 140 mmol/L (ref 134–144)
Total Protein: 6.5 g/dL (ref 6.0–8.5)
eGFR: 53 mL/min/{1.73_m2} — ABNORMAL LOW (ref >59)

## 2022-02-12 LAB — LIPID PANEL W/O CHOL/HDL RATIO
Cholesterol, Total: 166 mg/dL (ref 100–199)
HDL: 53 mg/dL (ref >39)
LDL Chol Calc (NIH): 99 mg/dL (ref 0–99)
Triglycerides: 76 mg/dL (ref 0–149)
VLDL Cholesterol Cal: 14 mg/dL (ref 5–40)

## 2022-02-22 ENCOUNTER — Other Ambulatory Visit: Payer: Self-pay | Admitting: Family Medicine

## 2022-02-24 ENCOUNTER — Ambulatory Visit (INDEPENDENT_AMBULATORY_CARE_PROVIDER_SITE_OTHER): Payer: Medicare HMO | Admitting: *Deleted

## 2022-02-24 DIAGNOSIS — Z0000 Encounter for general adult medical examination without abnormal findings: Secondary | ICD-10-CM

## 2022-02-24 NOTE — Progress Notes (Signed)
? ?Subjective:  ? Felicia Vargas is a 76 y.o. female who presents for Medicare Annual (Subsequent) preventive examination. ? ?I connected with  [name] on 02/24/22 by a telephone enabled telemedicine application and verified that I am speaking with the correct person using two identifiers. ?  ?I discussed the limitations of evaluation and management by telemedicine. The patient expressed understanding and agreed to proceed. ? ?Patient location: home ? ?Provider location: tele-health not in office ? ? ? ?Review of Systems    ? ?Cardiac Risk Factors include: advanced age (>77mn, >>34women);hypertension ? ?   ?Objective:  ?  ?Today's Vitals  ? 02/24/22 1029  ?PainSc: 8   ? ?There is no height or weight on file to calculate BMI. ? ? ?  02/24/2022  ? 10:38 AM 02/22/2021  ? 10:40 AM 02/13/2020  ?  3:35 PM 02/10/2019  ?  3:26 PM 02/01/2018  ?  3:13 PM 02/02/2017  ?  1:23 PM 03/23/2016  ? 12:10 PM  ?Advanced Directives  ?Does Patient Have a Medical Advance Directive? Yes Yes Yes Yes Yes Yes Yes  ?Type of AParamed[place]of [place] will Living will;Healthcare Power of Attorney Living will;Healthcare Power of [place]Living will [place]West [place]Living will HCastle ShannonLiving will  ?Does patient want to make changes to medical advance directive?      No - Patient declined   ?Copy of [place] Chart? No - copy requested No - copy requested No - copy requested  No - copy requested No - copy requested   ? ? ?Current Medications (verified) ?Outpatient Encounter Medications as of 02/24/2022  ?Medication Sig  ? aspirin 81 MG tablet Take 81 mg by mouth daily.  ? calcium carbonate (OS-CAL) 600 MG TABS Take 600 mg by mouth daily with breakfast.   ? Cholecalciferol (VITAMIN D PO) Take 1 capsule by mouth daily.  ? cyclobenzaprine (FLEXERIL) 5 MG tablet Take 1-2 tablets (5-10 mg total) by mouth at  bedtime.  ? docusate sodium (COLACE) 100 MG capsule Take 100 mg by mouth daily.   ? fluticasone (FLONASE) 50 MCG/ACT nasal spray SPRAY 1 SPRAY INTO BOTH NOSTRILS DAILY.  ? ibuprofen (ADVIL) 200 MG tablet Take 200 mg by mouth every 6 (six) hours as needed. 2x daily as needed for pain  ? lisinopril-hydrochlorothiazide (ZESTORETIC) 20-25 MG tablet Take 1 tablet by mouth daily.  ? loratadine (CLARITIN) 10 MG tablet Take 10 mg by mouth daily.  ? meloxicam (MOBIC) 15 MG tablet Take 15 mg by mouth daily.  ? Omega-3 Fatty Acids (FISH OIL PO) Take by mouth in the morning and at bedtime.  ? oxybutynin (DITROPAN) 5 MG tablet TAKE 1 TABLET (5 MG) BY ORAL ROUTE 2 TIMES PER DAY  ? pravastatin (PRAVACHOL) 20 MG tablet TAKE 1 TABLET BY MOUTH EVERYDAY AT BEDTIME  ? TURMERIC PO Take 1 tablet by mouth 2 (two) times daily.   ? ketoconazole (NIZORAL) 2 % cream Apply topically 2 (two) times daily. (Patient not taking: Reported on 02/24/2022)  ? NAPROXEN PO Take by mouth daily. (Patient not taking: Reported on 02/24/2022)  ? ?No facility-administered encounter medications on file as of 02/24/2022.  ? ? ?Allergies (verified) ?Patient has no known allergies.  ? ?History: ?Past Medical History:  ?Diagnosis Date  ? Allergy   ? Arthritis   ? Cancer ([facility]   ? skin  ? Diffuse cystic mastopathy 2013  ?  High cholesterol   ? Hypertension 1990's  ? Migraine   ? Migraines   ? Overactive bladder   ? Urinary incontinence   ? ?Past Surgical History:  ?Procedure Laterality Date  ? BREAST BIOPSY Right 05/31/93  ? neg/lump  ? COLONOSCOPY  11/2004  ? Dr. Chauncey [place]  ? COLONOSCOPY WITH PROPOFOL N/A 10/15/2016  ? Procedure: COLONOSCOPY WITH PROPOFOL;  Surgeon: Christene Lye, MD;  Location: [facility] ENDOSCOPY;  Service: Endoscopy;  Laterality: N/A;  ? KIDNEY SURGERY  1998  ? LITHOTRIPSY  1998  ? shin surgery Left 02/04/2021  ? SQUAMOUS CELL CARCINOMA EXCISION  02/04/2021  ? left leg  ? ?Family History  ?Problem Relation Age of Onset  ? Lung cancer Father   ? Stroke  Mother   ? Hypertension Sister   ? Arthritis Sister   ? Dementia Sister   ? Breast cancer Neg Hx   ? ?Social History  ? ?Socioeconomic History  ? Marital status: Widowed  ?  Spouse name: Not on file  ? Number of children: Not on file  ? Years of education: Not on file  ? Highest education level: 12th grade  ?Occupational History  ? Occupation: retired  ?Tobacco Use  ? Smoking status: Never  ? Smokeless tobacco: Never  ?Vaping Use  ? Vaping Use: Never used  ?Substance and Sexual Activity  ? Alcohol use: No  ? Drug use: No  ? Sexual activity: Not Currently  ?Other Topics Concern  ? Not on file  ?Social History Narrative  ? Not on file  ? ?Social Determinants of Health  ? ?Financial Resource Strain: Low Risk   ? Difficulty of Paying Living Expenses: Not hard at all  ?Food Insecurity: No Food Insecurity  ? Worried About Charity fundraiser in the Last Year: Never true  ? Ran Out of Food in the Last Year: Never true  ?Transportation Needs: No Transportation Needs  ? Lack of Transportation (Medical): No  ? Lack of Transportation (Non-Medical): No  ?Physical Activity: Insufficiently Active  ? Days of Exercise per Week: 3 days  ? Minutes of Exercise per Session: 30 min  ?Stress: No Stress Concern Present  ? Feeling of Stress : Not at all  ?Social Connections: Socially Isolated  ? Frequency of Communication with Friends and Family: Twice a week  ? Frequency of Social Gatherings with Friends and Family: More than three times a week  ? Attends Religious Services: Never  ? Active Member of Clubs or Organizations: No  ? Attends Archivist Meetings: Never  ? Marital Status: Widowed  ? ? ?Tobacco Counseling ?Counseling given: Not Answered ? ? ?Clinical Intake: ? ?Pre-visit preparation completed: Yes ? ?Pain : 0-10 ?Pain Score: 8  ?Pain Type: Chronic pain ?Pain Location: Knee ?Pain Orientation: Left, Right ?Pain Onset: 1 to 4 weeks ago ?Pain Frequency: Intermittent ?Pain Relieving Factors: meloxicam 15 mg 1 x day /    ibuprofen 200 x 2 daily ?Effect of Pain on Daily Activities: yes ? ?Pain Relieving Factors: meloxicam 15 mg 1 x day /   ibuprofen 200 x 2 daily ? ?Nutritional Risks: None ?Diabetes: No ? ?How often do you need to have someone help you when you read instructions, pamphlets, or other written materials from your doctor or pharmacy?: 1 - Never ? ?Diabetic  no ? ?Interpreter Needed?: No ? ?Information entered by :: Leroy Kennedy LPN ? ? ?Activities of Daily Living ? ?  02/24/2022  ? 10:45 AM  ?In your present  state of health, do you have any difficulty performing the following activities:  ?Hearing? 0  ?Vision? 0  ?Difficulty concentrating or making decisions? 0  ?Walking or climbing stairs? 1  ?Dressing or bathing? 0  ?Doing errands, shopping? 1  ?Comment right now having knee issues no problem before  ?Preparing Food and eating ? N  ?Using the Toilet? N  ?In the past six months, have you accidently leaked urine? N  ?Do you have problems with loss of bowel control? N  ?Managing your Medications? N  ?Managing your Finances? N  ?Housekeeping or managing your Housekeeping? N  ? ? ?Patient Care Team: ?Valerie Roys, DO as PCP - General (Family Medicine) ?Rico Junker, RN as Registered Nurse ?Kem Parkinson, MD (Ophthalmology) ?Silvano Bilis, PA (Dermatology) ?Ree Edman, MD (Dermatology) ? ?Indicate any recent Medical Services you may have received from other than [facility] providers in the past year (date may be approximate). ? ?   ?Assessment:  ? This is a routine wellness examination for [place]. ? ?Hearing/Vision screen ?Hearing Screening - Comments:: No trouble hearing ?Vision Screening - Comments:: Not up to date ? ? ?Dietary issues and exercise activities discussed: ?Exercise limited by: orthopedic condition(s) ? ? Goals Addressed   ? ?  ?  ?  ?  ? This Visit's Progress  ?  Weight (lb) < 200 lb (90.7 kg)     ? ?  ? ?Depression Screen ? ?  02/24/2022  ? 10:44 AM 02/11/2022  ?  9:01 AM 09/10/2021  ?  8:55  AM 09/10/2021  ?  8:31 AM 02/22/2021  ? 10:41 AM 09/10/2020  ?  9:44 AM 02/13/2020  ?  3:34 PM  ?PHQ 2/9 Scores  ?PHQ - 2 Score 0 0 0 0 0 0 0  ?PHQ- 9 Score 0 1 1 0  0   ?  ?Fall Risk ? ?  02/24/2022  ? 10:33 AM

## 2022-02-24 NOTE — Telephone Encounter (Signed)
Requested medication (s) are due for refill today:yes  ? ?Requested medication (s) are on the active medication list: yes   ? ?Last refill:  11/07/21  ? ?Future visit scheduled:yes  ? ?Notes to clinic:  Unable to refill per protocol, cannot delegate. ? ? ? ?  ?Requested Prescriptions  ?Pending Prescriptions Disp Refills  ? fluticasone (FLONASE) 50 MCG/ACT nasal spray [Pharmacy Med Name: FLUTICASONE PROP 50 MCG SPRAY] 48 mL 1  ?  Sig: INSTILL 1 SPRAY INTO BOTH NOSTRILS DAILY  ?  ? Not Delegated - Ear, Nose, and Throat: Nasal Preparations - Corticosteroids Failed - 02/22/2022  9:06 AM  ?  ?  Failed - This refill cannot be delegated  ?  ?  Passed - Valid encounter within last 12 months  ?  Recent Outpatient Visits   ? ?      ? 1 week ago Primary hypertension  ? [place], DO  ? 5 months ago Routine general medical examination at a health care facility  ? [place], DO  ? 11 months ago Primary hypertension  ? [place], DO  ? 1 year ago Routine general medical examination at a health care facility  ? [place], DO  ? 1 year ago Acute pain of left knee  ? [place], [place] P, DO  ? ?  ?  ?Future Appointments   ? ?        ? In 6 months Wynetta Emery, Barb Merino, DO [facility]  ? ?  ? ?  ?  ?  ? ? ?

## 2022-02-24 NOTE — Patient Instructions (Addendum)
Ms. Nevin , ?Thank you for taking time to come for your Medicare Wellness Visit. I appreciate your ongoing commitment to your health goals. Please review the following plan we discussed and let me know if I can assist you in the future.  ? ?Screening recommendations/referrals: ?Colonoscopy: up to date ?Mammogram: up to date ?Bone Density: up to date ?Recommended yearly ophthalmology/optometry visit for glaucoma screening and checkup ?Recommended yearly dental visit for hygiene and checkup ? ?Vaccinations: ?Influenza vaccine: up to date ?Pneumococcal vaccine: up to date ?Tdap vaccine: up to date ?Shingles vaccine: Education provided   ? ?Advanced directives: will bring to put on file ? ?Conditions/risks identified:  ? ?Next appointment: 09-16-2022 @ 9:20  Wynetta Emery ? ? ?Preventive Care   40-64  Years and Older, Female ?Preventive care refers to lifestyle choices and visits with your health care provider that can promote health and wellness. ?What does preventive care include? ?A yearly physical exam. This is also called an annual well check. ?Dental exams once or twice a year. ?Routine eye exams. Ask your health care provider how often you should have your eyes checked. ?Personal lifestyle choices, including: ?Daily care of your teeth and gums. ?Regular physical activity. ?Eating a healthy diet. ?Avoiding tobacco and drug use. ?Limiting alcohol use. ?Practicing safe sex. ?Taking low-dose aspirin every day. ?Taking vitamin and mineral supplements as recommended by your health care provider. ?What happens during an annual well check? ?The services and screenings done by your health care provider during your annual well check will depend on your age, overall health, lifestyle risk factors, and family history of disease. ?Counseling  ?Your health care provider may ask you questions about your: ?Alcohol use. ?Tobacco use. ?Drug use. ?Emotional well-being. ?Home and relationship well-being. ?Sexual activity. ?Eating  habits. ?History of falls. ?Memory and ability to understand (cognition). ?Work and work Statistician. ?Reproductive health. ?Screening  ?You may have the following tests or measurements: ?Height, weight, and BMI. ?Blood pressure. ?Lipid and cholesterol levels. These may be checked every 5 years, or more frequently if you are over 73 years old. ?Skin check. ?Lung cancer screening. You may have this screening every year starting at age 56 if you have a 30-pack-year history of smoking and currently smoke or have quit within the past 15 years. ?Fecal occult blood test (FOBT) of the stool. You may have this test every year starting at age 92. ?Flexible sigmoidoscopy or colonoscopy. You may have a sigmoidoscopy every 5 years or a colonoscopy every 10 years starting at age 67. ?Hepatitis C blood test. ?Hepatitis B blood test. ?Sexually transmitted disease (STD) testing. ?Diabetes screening. This is done by checking your blood sugar (glucose) after you have not eaten for a while (fasting). You may have this done every 1-3 years. ?Bone density scan. This is done to screen for osteoporosis. You may have this done starting at age 103. ?Mammogram. This may be done every 1-2 years. Talk to your health care provider about how often you should have regular mammograms. ?Talk with your health care provider about your test results, treatment options, and if necessary, the need for more tests. ?Vaccines  ?Your health care provider may recommend certain vaccines, such as: ?Influenza vaccine. This is recommended every year. ?Tetanus, diphtheria, and acellular pertussis (Tdap, Td) vaccine. You may need a Td booster every 10 years. ?Zoster vaccine. You may need this after age 70. ?Pneumococcal 13-valent conjugate (PCV13) vaccine. One dose is recommended after age 72. ?Pneumococcal polysaccharide (PPSV23) vaccine. One dose is recommended after  age 65. ?Talk to your health care provider about which screenings and vaccines you need and how  often you need them. ?This information is not intended to replace advice given to you by your health care provider. Make sure you discuss any questions you have with your health care provider. ?Document Released: 11/30/2015 Document Revised: 07/23/2016 Document Reviewed: 09/04/2015 ?Elsevier Interactive Patient Education ? 2017 [place]. ? ?Fall Prevention in the Home ?Falls can cause injuries. They can happen to people of all ages. There are many things you can do to make your home safe and to help prevent falls. ?What can I do on the outside of my home? ?Regularly fix the edges of walkways and driveways and fix any cracks. ?Remove anything that might make you trip as you walk through a door, such as a raised step or threshold. ?Trim any bushes or trees on the path to your home. ?Use bright outdoor lighting. ?Clear any walking paths of anything that might make someone trip, such as rocks or tools. ?Regularly check to see if handrails are loose or broken. Make sure that both sides of any steps have handrails. ?Any raised decks and porches should have guardrails on the edges. ?Have any leaves, snow, or ice cleared regularly. ?Use sand or salt on walking paths during winter. ?Clean up any spills in your garage right away. This includes oil or grease spills. ?What can I do in the bathroom? ?Use night lights. ?Install grab bars by the toilet and in the tub and shower. Do not use towel bars as grab bars. ?Use non-skid mats or decals in the tub or shower. ?If you need to sit down in the shower, use a plastic, non-slip stool. ?Keep the floor dry. Clean up any water that spills on the floor as soon as it happens. ?Remove soap buildup in the tub or shower regularly. ?Attach bath mats securely with double-sided non-slip rug tape. ?Do not have throw rugs and other things on the floor that can make you trip. ?What can I do in the bedroom? ?Use night lights. ?Make sure that you have a light by your bed that is easy to  reach. ?Do not use any sheets or blankets that are too big for your bed. They should not hang down onto the floor. ?Have a firm chair that has side arms. You can use this for support while you get dressed. ?Do not have throw rugs and other things on the floor that can make you trip. ?What can I do in the kitchen? ?Clean up any spills right away. ?Avoid walking on wet floors. ?Keep items that you use a lot in easy-to-reach places. ?If you need to reach something above you, use a strong step stool that has a grab bar. ?Keep electrical cords out of the way. ?Do not use floor polish or wax that makes floors slippery. If you must use wax, use non-skid floor wax. ?Do not have throw rugs and other things on the floor that can make you trip. ?What can I do with my stairs? ?Do not leave any items on the stairs. ?Make sure that there are handrails on both sides of the stairs and use them. Fix handrails that are broken or loose. Make sure that handrails are as long as the stairways. ?Check any carpeting to make sure that it is firmly attached to the stairs. Fix any carpet that is loose or worn. ?Avoid having throw rugs at the top or bottom of the stairs. If you do  have throw rugs, attach them to the floor with carpet tape. ?Make sure that you have a light switch at the top of the stairs and the bottom of the stairs. If you do not have them, ask someone to add them for you. ?What else can I do to help prevent falls? ?Wear shoes that: ?Do not have high heels. ?Have rubber bottoms. ?Are comfortable and fit you well. ?Are closed at the toe. Do not wear sandals. ?If you use a stepladder: ?Make sure that it is fully opened. Do not climb a closed stepladder. ?Make sure that both sides of the stepladder are locked into place. ?Ask someone to hold it for you, if possible. ?Clearly mark and make sure that you can see: ?Any grab bars or handrails. ?First and last steps. ?Where the edge of each step is. ?Use tools that help you move  around (mobility aids) if they are needed. These include: ?Canes. ?Walkers. ?Scooters. ?Crutches. ?Turn on the lights when you go into a dark area. Replace any light bulbs as soon as they burn out. ?Set up your furniture

## 2022-02-27 DIAGNOSIS — M170 Bilateral primary osteoarthritis of knee: Secondary | ICD-10-CM | POA: Diagnosis not present

## 2022-02-28 ENCOUNTER — Encounter: Payer: Self-pay | Admitting: Family Medicine

## 2022-02-28 ENCOUNTER — Ambulatory Visit: Payer: Medicare HMO | Admitting: Family Medicine

## 2022-02-28 ENCOUNTER — Ambulatory Visit (INDEPENDENT_AMBULATORY_CARE_PROVIDER_SITE_OTHER): Payer: Medicare HMO | Admitting: Family Medicine

## 2022-02-28 VITALS — BP 132/80 | HR 80 | Temp 99.4°F | Wt 248.2 lb

## 2022-02-28 DIAGNOSIS — J069 Acute upper respiratory infection, unspecified: Secondary | ICD-10-CM | POA: Diagnosis not present

## 2022-02-28 MED ORDER — PREDNISONE 50 MG PO TABS: 50.0000 mg | ORAL_TABLET | Freq: Every day | ORAL | 0 refills | Status: DC

## 2022-02-28 MED ORDER — BENZONATATE 200 MG PO CAPS: 200.0000 mg | ORAL_CAPSULE | Freq: Two times a day (BID) | ORAL | 0 refills | Status: DC | PRN

## 2022-02-28 MED ORDER — HYDROCOD POLI-CHLORPHE POLI ER 10-8 MG/5ML PO SUER: 5.0000 mL | Freq: Two times a day (BID) | ORAL | 0 refills | Status: DC | PRN

## 2022-02-28 NOTE — Progress Notes (Signed)
? ?BP 132/80   Pulse 80   Temp 99.4 ?F (37.4 ?C)   Wt 248 lb 3.2 oz (112.6 kg)   SpO2 97%   BMI 42.60 kg/m?   ? ?Subjective:  ? ? Patient ID: Felicia Vargas, female    DOB: [birth year], 76 y.o.   MRN: [id number] ? ?HPI: ?[name] is a 76 y.o. female ? ?Chief Complaint  ?Patient presents with  ? Cough  ?  Patient states she has been coughing and chest congestion. Patient had low grade fever of 99.9 last night. Patient states she has a runny nose and watery eyes.   ? ?UPPER RESPIRATORY TRACT INFECTION ?Duration: 2 days ?Worst symptom: cough, congestion ?Fever: low grade ?Cough: yes ?Shortness of breath: no ?Wheezing: no ?Chest pain: no ?Chest tightness: no ?Chest congestion: no ?Nasal congestion: yes ?Runny nose: yes ?Post nasal drip: yes ?Sneezing: yes ?Sore throat: no ?Swollen glands: no ?Sinus pressure: no ?Headache: yes ?Face pain: no ?Toothache: no ?Ear pain: no  ?Ear pressure: no  ?Eyes red/itching:no ?Eye drainage/crusting: no  ?Vomiting: no ?Rash: no ?Fatigue: yes ?Sick contacts: yes ?Strep contacts: no  ?Context: worse ?Recurrent sinusitis: no ?Relief with OTC cold/cough medications: no  ?Treatments attempted: corecidin and cough medicine  ? ? ?Relevant past medical, surgical, family and social history reviewed and updated as indicated. Interim medical history since our last visit reviewed. ?Allergies and medications reviewed and updated. ? ?Review of Systems  ?Constitutional:  Positive for fatigue and fever. Negative for activity change, appetite change, chills, diaphoresis and unexpected weight change.  ?HENT:  Positive for congestion, postnasal drip, rhinorrhea and sinus pressure. Negative for dental problem, drooling, ear discharge, ear pain, facial swelling, hearing loss, mouth sores, nosebleeds, sinus pain, sneezing, sore throat, tinnitus, trouble swallowing and voice change.   ?Respiratory:  Positive for cough. Negative for apnea, choking, chest tightness, shortness of breath, wheezing and  stridor.   ?Cardiovascular: Negative.   ?Gastrointestinal: Negative.   ?Musculoskeletal: Negative.   ?Psychiatric/Behavioral: Negative.    ? ?Per HPI unless specifically indicated above ? ?   ?Objective:  ?  ?BP 132/80   Pulse 80   Temp 99.4 ?F (37.4 ?C)   Wt 248 lb 3.2 oz (112.6 kg)   SpO2 97%   BMI 42.60 kg/m?   ?Wt Readings from Last 3 Encounters:  ?02/28/22 248 lb 3.2 oz (112.6 kg)  ?02/11/22 251 lb (113.9 kg)  ?09/10/21 278 lb (126.1 kg)  ?  ?Physical Exam ?Vitals and nursing note reviewed.  ?Constitutional:   ?   General: She is not in acute distress. ?   Appearance: Normal appearance. She is obese. She is not ill-appearing, toxic-appearing or diaphoretic.  ?HENT:  ?   Head: Normocephalic and atraumatic.  ?   Right Ear: Tympanic membrane, ear canal and external ear normal.  ?   Left Ear: Tympanic membrane, ear canal and external ear normal.  ?   Nose: Nose normal. No congestion or rhinorrhea.  ?   Mouth/Throat:  ?   Mouth: Mucous membranes are moist.  ?   Pharynx: Oropharynx is clear. No oropharyngeal exudate or posterior oropharyngeal erythema.  ?Eyes:  ?   General: No scleral icterus.    ?   Right eye: No discharge.     ?   Left eye: No discharge.  ?   Extraocular Movements: Extraocular movements intact.  ?   Conjunctiva/sclera: Conjunctivae normal.  ?   Pupils: Pupils are equal, round, and reactive to light.  ?Cardiovascular:  ?  Rate and Rhythm: Normal rate and regular rhythm.  ?   Pulses: Normal pulses.  ?   Heart sounds: Normal heart sounds. No murmur heard. ?  No friction rub. No gallop.  ?Pulmonary:  ?   Effort: Pulmonary effort is normal. No respiratory distress.  ?   Breath sounds: Normal breath sounds. No stridor. No wheezing, rhonchi or rales.  ?Chest:  ?   Chest wall: No tenderness.  ?Musculoskeletal:     ?   General: Normal range of motion.  ?   Cervical back: Normal range of motion and neck supple.  ?Skin: ?   General: Skin is warm and dry.  ?   Capillary Refill: Capillary refill takes  less than 2 seconds.  ?   Coloration: Skin is not jaundiced or pale.  ?   Findings: No bruising, erythema, lesion or rash.  ?Neurological:  ?   General: No focal deficit present.  ?   Mental Status: She is alert and oriented to person, place, and time. Mental status is at baseline.  ?Psychiatric:     ?   Mood and Affect: Mood normal.     ?   Behavior: Behavior normal.     ?   Thought Content: Thought content normal.     ?   Judgment: Judgment normal.  ? ? ?Results for orders placed or performed in visit on 02/11/22  ?Comprehensive metabolic panel  ?Result Value Ref Range  ? Glucose 96 70 - 99 mg/dL  ? BUN 28 (H) 8 - 27 mg/dL  ? Creatinine, Ser 1.09 (H) 0.57 - 1.00 mg/dL  ? eGFR 53 (L) >59 mL/min/1.73  ? BUN/Creatinine Ratio 26 12 - 28  ? Sodium 140 134 - 144 mmol/L  ? Potassium 3.9 3.5 - 5.2 mmol/L  ? Chloride 103 96 - 106 mmol/L  ? CO2 18 (L) 20 - 29 mmol/L  ? Calcium 9.9 8.7 - 10.3 mg/dL  ? Total Protein 6.5 6.0 - 8.5 g/dL  ? Albumin 3.8 3.7 - 4.7 g/dL  ? Globulin, Total 2.7 1.5 - 4.5 g/dL  ? Albumin/Globulin Ratio 1.4 1.2 - 2.2  ? Bilirubin Total 0.7 0.0 - 1.2 mg/dL  ? Alkaline Phosphatase 84 44 - 121 IU/L  ? AST 17 0 - 40 IU/L  ? ALT 18 0 - 32 IU/L  ?Lipid Panel w/o Chol/HDL Ratio  ?Result Value Ref Range  ? Cholesterol, Total 166 100 - 199 mg/dL  ? Triglycerides 76 0 - 149 mg/dL  ? HDL 53 >39 mg/dL  ? VLDL Cholesterol Cal 14 5 - 40 mg/dL  ? LDL Chol Calc (NIH) 99 0 - 99 mg/dL  ? ?   ?Assessment & Plan:  ? ?Problem List Items Addressed This Visit   ?None ?Visit Diagnoses   ? ? Upper respiratory tract infection, unspecified type    -  Primary  ? Will treat with burst of prednisone, tessalon and tussionex. Call if not getting better or getting worse. Continue to monitor. Call with any concerns.   ? ?  ?  ? ?Follow up plan: ?Return if symptoms worsen or fail to improve. ? ? ? ? ? ?

## 2022-03-03 DIAGNOSIS — M170 Bilateral primary osteoarthritis of knee: Secondary | ICD-10-CM | POA: Diagnosis not present

## 2022-03-04 DIAGNOSIS — M170 Bilateral primary osteoarthritis of knee: Secondary | ICD-10-CM | POA: Diagnosis not present

## 2022-03-06 DIAGNOSIS — M170 Bilateral primary osteoarthritis of knee: Secondary | ICD-10-CM | POA: Diagnosis not present

## 2022-03-10 ENCOUNTER — Ambulatory Visit: Payer: Medicare HMO | Admitting: Family Medicine

## 2022-03-11 DIAGNOSIS — M170 Bilateral primary osteoarthritis of knee: Secondary | ICD-10-CM | POA: Diagnosis not present

## 2022-03-14 DIAGNOSIS — M170 Bilateral primary osteoarthritis of knee: Secondary | ICD-10-CM | POA: Diagnosis not present

## 2022-03-18 DIAGNOSIS — M170 Bilateral primary osteoarthritis of knee: Secondary | ICD-10-CM | POA: Diagnosis not present

## 2022-03-21 DIAGNOSIS — M170 Bilateral primary osteoarthritis of knee: Secondary | ICD-10-CM | POA: Diagnosis not present

## 2022-03-24 DIAGNOSIS — M170 Bilateral primary osteoarthritis of knee: Secondary | ICD-10-CM | POA: Diagnosis not present

## 2022-03-31 DIAGNOSIS — M170 Bilateral primary osteoarthritis of knee: Secondary | ICD-10-CM | POA: Diagnosis not present

## 2022-04-04 DIAGNOSIS — M170 Bilateral primary osteoarthritis of knee: Secondary | ICD-10-CM | POA: Diagnosis not present

## 2022-04-07 DIAGNOSIS — M170 Bilateral primary osteoarthritis of knee: Secondary | ICD-10-CM | POA: Diagnosis not present

## 2022-04-08 ENCOUNTER — Other Ambulatory Visit: Payer: Self-pay | Admitting: Family Medicine

## 2022-04-08 DIAGNOSIS — Z1231 Encounter for screening mammogram for malignant neoplasm of breast: Secondary | ICD-10-CM

## 2022-04-10 DIAGNOSIS — M170 Bilateral primary osteoarthritis of knee: Secondary | ICD-10-CM | POA: Diagnosis not present

## 2022-04-23 DIAGNOSIS — M170 Bilateral primary osteoarthritis of knee: Secondary | ICD-10-CM | POA: Diagnosis not present

## 2022-04-28 DIAGNOSIS — M170 Bilateral primary osteoarthritis of knee: Secondary | ICD-10-CM | POA: Diagnosis not present

## 2022-04-30 DIAGNOSIS — M170 Bilateral primary osteoarthritis of knee: Secondary | ICD-10-CM | POA: Diagnosis not present

## 2022-05-06 DIAGNOSIS — M170 Bilateral primary osteoarthritis of knee: Secondary | ICD-10-CM | POA: Diagnosis not present

## 2022-05-08 ENCOUNTER — Ambulatory Visit
Admission: RE | Admit: 2022-05-08 | Discharge: 2022-05-08 | Disposition: A | Discharge status: Home / Self Care | Payer: Medicare HMO | Source: Ambulatory Visit | Attending: Family Medicine | Admitting: Family Medicine

## 2022-05-08 DIAGNOSIS — Z1231 Encounter for screening mammogram for malignant neoplasm of breast: Secondary | ICD-10-CM | POA: Diagnosis not present

## 2022-05-09 DIAGNOSIS — M170 Bilateral primary osteoarthritis of knee: Secondary | ICD-10-CM | POA: Diagnosis not present

## 2022-05-23 DIAGNOSIS — M170 Bilateral primary osteoarthritis of knee: Secondary | ICD-10-CM | POA: Diagnosis not present

## 2022-05-27 DIAGNOSIS — M170 Bilateral primary osteoarthritis of knee: Secondary | ICD-10-CM | POA: Diagnosis not present

## 2022-06-03 DIAGNOSIS — M170 Bilateral primary osteoarthritis of knee: Secondary | ICD-10-CM | POA: Diagnosis not present

## 2022-06-10 DIAGNOSIS — M170 Bilateral primary osteoarthritis of knee: Secondary | ICD-10-CM | POA: Diagnosis not present

## 2022-06-17 DIAGNOSIS — M170 Bilateral primary osteoarthritis of knee: Secondary | ICD-10-CM | POA: Diagnosis not present

## 2022-06-24 DIAGNOSIS — M170 Bilateral primary osteoarthritis of knee: Secondary | ICD-10-CM | POA: Diagnosis not present

## 2022-07-01 DIAGNOSIS — M170 Bilateral primary osteoarthritis of knee: Secondary | ICD-10-CM | POA: Diagnosis not present

## 2022-07-08 DIAGNOSIS — M170 Bilateral primary osteoarthritis of knee: Secondary | ICD-10-CM | POA: Diagnosis not present

## 2022-07-18 ENCOUNTER — Encounter: Payer: Self-pay | Admitting: Family Medicine

## 2022-07-18 ENCOUNTER — Ambulatory Visit: Payer: Self-pay

## 2022-07-18 ENCOUNTER — Ambulatory Visit (INDEPENDENT_AMBULATORY_CARE_PROVIDER_SITE_OTHER): Payer: Medicare HMO | Admitting: Family Medicine

## 2022-07-18 VITALS — BP 122/74 | HR 67 | Temp 97.9°F | Wt 237.1 lb

## 2022-07-18 DIAGNOSIS — R944 Abnormal results of kidney function studies: Secondary | ICD-10-CM | POA: Diagnosis not present

## 2022-07-18 DIAGNOSIS — Z01812 Encounter for preprocedural laboratory examination: Secondary | ICD-10-CM | POA: Diagnosis not present

## 2022-07-18 DIAGNOSIS — R718 Other abnormality of red blood cells: Secondary | ICD-10-CM | POA: Diagnosis not present

## 2022-07-18 DIAGNOSIS — L03114 Cellulitis of left upper limb: Secondary | ICD-10-CM | POA: Diagnosis not present

## 2022-07-18 DIAGNOSIS — R7309 Other abnormal glucose: Secondary | ICD-10-CM | POA: Diagnosis not present

## 2022-07-18 DIAGNOSIS — E876 Hypokalemia: Secondary | ICD-10-CM | POA: Diagnosis not present

## 2022-07-18 MED ORDER — SULFAMETHOXAZOLE-TRIMETHOPRIM 800-160 MG PO TABS: 1.0000 | ORAL_TABLET | Freq: Two times a day (BID) | ORAL | 0 refills | Status: DC

## 2022-07-18 NOTE — Progress Notes (Signed)
BP 122/74   Pulse 67   Temp 97.9 F (36.6 C)   Wt 237 lb 1.6 oz (107.5 kg)   SpO2 99%   BMI 40.70 kg/m    Subjective:    Patient ID: Felicia Vargas, female    DOB: 1946-10-19, 76 y.o.   MRN: [id number]  HPI: Felicia CLITES is a 76 y.o. female  Chief Complaint  Patient presents with   Hand Injury    Patient states she injured your hand on Tuesday evening. Since then it has been red, swollen, painful to the touch, and warm to the touch. Patient was scheduled to get knee surgery next Thursday and has been off of all her medications except her BP medications.    SKIN INFECTION Duration:  3 days Location: L hand History of trauma in area: yes Pain: yes Quality: aching and tight Severity: moderate Redness: yes Swelling: yes Oozing: no Pus: no Fevers: no Nausea/vomiting: no Status: worse Treatments attempted:none  Tetanus: UTD   Relevant past medical, surgical, family and social history reviewed and updated as indicated. Interim medical history since our last visit reviewed. Allergies and medications reviewed and updated.  Review of Systems  Constitutional: Negative.   Respiratory: Negative.    Cardiovascular: Negative.   Gastrointestinal: Negative.   Musculoskeletal:  Positive for myalgias. Negative for arthralgias, back pain, gait problem, joint swelling, neck pain and neck stiffness.  Skin:  Positive for color change. Negative for pallor, rash and wound.  Neurological: Negative.   Psychiatric/Behavioral: Negative.      Per HPI unless specifically indicated above     Objective:    BP 122/74   Pulse 67   Temp 97.9 F (36.6 C)   Wt 237 lb 1.6 oz (107.5 kg)   SpO2 99%   BMI 40.70 kg/m   Wt Readings from Last 3 Encounters:  07/18/22 237 lb 1.6 oz (107.5 kg)  02/28/22 248 lb 3.2 oz (112.6 kg)  02/11/22 251 lb (113.9 kg)    Physical Exam Vitals and nursing note reviewed.  Constitutional:      General: She is not in acute distress.    Appearance:  Normal appearance. She is not ill-appearing, toxic-appearing or diaphoretic.  HENT:     Head: Normocephalic and atraumatic.     Right Ear: External ear normal.     Left Ear: External ear normal.     Nose: Nose normal.     Mouth/Throat:     Mouth: Mucous membranes are moist.     Pharynx: Oropharynx is clear.  Eyes:     General: No scleral icterus.       Right eye: No discharge.        Left eye: No discharge.     Extraocular Movements: Extraocular movements intact.     Conjunctiva/sclera: Conjunctivae normal.     Pupils: Pupils are equal, round, and reactive to light.  Cardiovascular:     Rate and Rhythm: Normal rate and regular rhythm.     Pulses: Normal pulses.     Heart sounds: Normal heart sounds. No murmur heard.    No friction rub. No gallop.  Pulmonary:     Effort: Pulmonary effort is normal. No respiratory distress.     Breath sounds: Normal breath sounds. No stridor. No wheezing, rhonchi or rales.  Chest:     Chest wall: No tenderness.  Musculoskeletal:        General: Swelling and tenderness present. Normal range of motion.     Cervical  back: Normal range of motion and neck supple.  Skin:    General: Skin is warm and dry.     Capillary Refill: Capillary refill takes less than 2 seconds.     Coloration: Skin is not jaundiced or pale.     Findings: Erythema present. No bruising, lesion or rash.     Comments: Red, hot, swollen L hand  Neurological:     General: No focal deficit present.     Mental Status: She is alert and oriented to person, place, and time. Mental status is at baseline.  Psychiatric:        Mood and Affect: Mood normal.        Behavior: Behavior normal.        Thought Content: Thought content normal.        Judgment: Judgment normal.     Results for orders placed or performed in visit on 02/11/22  Comprehensive metabolic panel  Result Value Ref Range   Glucose 96 70 - 99 mg/dL   BUN 28 (H) 8 - 27 mg/dL   Creatinine, Ser 1.09 (H) 0.57 - 1.00  mg/dL   eGFR 53 (L) >59 mL/min/1.73   BUN/Creatinine Ratio 26 12 - 28   Sodium 140 134 - 144 mmol/L   Potassium 3.9 3.5 - 5.2 mmol/L   Chloride 103 96 - 106 mmol/L   CO2 18 (L) 20 - 29 mmol/L   Calcium 9.9 8.7 - 10.3 mg/dL   Total Protein 6.5 6.0 - 8.5 g/dL   Albumin 3.8 3.7 - 4.7 g/dL   Globulin, Total 2.7 1.5 - 4.5 g/dL   Albumin/Globulin Ratio 1.4 1.2 - 2.2   Bilirubin Total 0.7 0.0 - 1.2 mg/dL   Alkaline Phosphatase 84 44 - 121 IU/L   AST 17 0 - 40 IU/L   ALT 18 0 - 32 IU/L  Lipid Panel w/o Chol/HDL Ratio  Result Value Ref Range   Cholesterol, Total 166 100 - 199 mg/dL   Triglycerides 76 0 - 149 mg/dL   HDL 53 >39 mg/dL   VLDL Cholesterol Cal 14 5 - 40 mg/dL   LDL Chol Calc (NIH) 99 0 - 99 mg/dL      Assessment & Plan:   Problem List Items Addressed This Visit   None Visit Diagnoses     Cellulitis of left upper extremity    -  Primary   Will treat with bactrim. Recheck Tuesday. Call with any concerns.         Follow up plan: Return [place], [place] to double book.

## 2022-07-18 NOTE — Telephone Encounter (Signed)
  Chief Complaint: Swollen red warm painful left hand Symptoms: ibid Frequency: Tuesday Pertinent Negatives: Patient denies Inability to use hand Disposition: '[]'$ ED /'[]'$ Urgent Care (no appt availability in office) / '[x]'$ Appointment(In office/virtual)/ '[]'$  [facility]/ '[]'$ Home Care/ '[]'$ Refused Recommended Disposition /'[]'$ [facility]/ '[]'$  Follow-up with PCP Additional Notes: Pt hurt her hand in the back door on Tuesday. Hand is now red, swollen , warm and painful. There was a break in the skin. Pt is unsure when she last had a tetanus shot.  PT will need an after visit summary.   Reason for Disposition  [1] MODERATE pain (e.g., interferes with normal activities) AND [2] high-risk adult (e.g., age > 20 years, osteoporosis, chronic steroid use)  Answer Assessment - Initial Assessment Questions 1. MECHANISM: "How did the injury happen?"    Back door 2. ONSET: "When did the injury happen?" (Minutes or hours ago)      Tuesday 3. APPEARANCE of INJURY: "What does the injury look like?"      Swollen and red 4. SEVERITY: "Can you use the hand normally?" "Can you bend your fingers into a ball and then fully open them?"     Left hand 5. SIZE: For cuts, bruises, or swelling, ask: "How large is it?" (e.g., inches or centimeters;  entire hand or wrist)      Swelling on top and hand part way to elbow 6. PAIN: "Is there pain?" If Yes, ask: "How bad is the pain?"  (Scale 1-10; or mild, moderate, severe)     8/10 7. TETANUS: For any breaks in the skin, ask: "When was the last tetanus booster?"     unknown 8. OTHER SYMPTOMS: "Do you have any other symptoms?"      5/10 9. PREGNANCY: "Is there any chance you are pregnant?" "When was your last menstrual period?"     na  Protocols used: Hand and Wrist Injury-A-AH

## 2022-07-18 NOTE — Telephone Encounter (Signed)
FYI for appointment today.  

## 2022-07-20 ENCOUNTER — Other Ambulatory Visit: Payer: Self-pay | Admitting: Family Medicine

## 2022-07-22 ENCOUNTER — Ambulatory Visit (INDEPENDENT_AMBULATORY_CARE_PROVIDER_SITE_OTHER): Payer: Medicare HMO | Admitting: Family Medicine

## 2022-07-22 ENCOUNTER — Encounter: Payer: Self-pay | Admitting: Family Medicine

## 2022-07-22 VITALS — BP 121/70 | HR 73 | Temp 98.0°F | Wt 237.1 lb

## 2022-07-22 DIAGNOSIS — L03114 Cellulitis of left upper limb: Secondary | ICD-10-CM

## 2022-07-22 NOTE — Telephone Encounter (Signed)
Requested Prescriptions  Pending Prescriptions Disp Refills  . fluticasone (FLONASE) 50 MCG/ACT nasal spray [Pharmacy Med Name: FLUTICASONE PROP 50 MCG SPRAY] 48 mL 1    Sig: SPRAY 1 SPRAY INTO EACH NOSTRIL DAILY     Ear, Nose, and Throat: Nasal Preparations - Corticosteroids Passed - 07/20/2022  9:36 AM      Passed - Valid encounter within last 12 months    Recent Outpatient Visits          Today Cellulitis of left upper extremity   [facility] [place], Megan P, DO   4 days ago Cellulitis of left upper extremity   [facility] [place], Megan P, DO   4 months ago Upper respiratory tract infection, unspecified type   [place], Megan P, DO   5 months ago Primary hypertension   [place], Megan P, DO   10 months ago Routine general medical examination at a health care facility   [place], [place], DO      Future Appointments            In 1 month Wynetta Emery, Barb Merino, DO [employer], [place]

## 2022-07-22 NOTE — Progress Notes (Signed)
BP 121/70   Pulse 73   Temp 98 F (36.7 C)   Wt 237 lb 1.6 oz (107.5 kg)   SpO2 97%   BMI 40.70 kg/m    Subjective:    Patient ID: Felicia Vargas, female    DOB: 1945/12/11, 76 y.o.   MRN: [id number]  HPI: Felicia Vargas is a 76 y.o. female  Chief Complaint  Patient presents with   Cellulitis    Patient states her arm is getting better, not as red today    SKIN INFECTION Duration: 8 days Location: L wrist History of trauma in area: yes Pain: no Quality: no pain Severity: mild Redness: yes Swelling: no Oozing: no Pus: no Fevers: no Nausea/vomiting: no Status: better Treatments attempted:antibiotics  Tetanus: UTD  Relevant past medical, surgical, family and social history reviewed and updated as indicated. Interim medical history since our last visit reviewed. Allergies and medications reviewed and updated.  Review of Systems  Constitutional: Negative.   Respiratory: Negative.    Cardiovascular: Negative.   Gastrointestinal: Negative.   Musculoskeletal: Negative.   Skin:  Positive for wound. Negative for color change, pallor and rash.  Neurological: Negative.   Psychiatric/Behavioral: Negative.      Per HPI unless specifically indicated above     Objective:    BP 121/70   Pulse 73   Temp 98 F (36.7 C)   Wt 237 lb 1.6 oz (107.5 kg)   SpO2 97%   BMI 40.70 kg/m   Wt Readings from Last 3 Encounters:  07/22/22 237 lb 1.6 oz (107.5 kg)  07/18/22 237 lb 1.6 oz (107.5 kg)  02/28/22 248 lb 3.2 oz (112.6 kg)    Physical Exam Vitals and nursing note reviewed.  Constitutional:      General: She is not in acute distress.    Appearance: Normal appearance. She is obese. She is not ill-appearing, toxic-appearing or diaphoretic.  HENT:     Head: Normocephalic and atraumatic.     Right Ear: External ear normal.     Left Ear: External ear normal.     Nose: Nose normal.     Mouth/Throat:     Mouth: Mucous membranes are moist.     Pharynx: Oropharynx is  clear.  Eyes:     General: No scleral icterus.       Right eye: No discharge.        Left eye: No discharge.     Extraocular Movements: Extraocular movements intact.     Conjunctiva/sclera: Conjunctivae normal.     Pupils: Pupils are equal, round, and reactive to light.  Cardiovascular:     Rate and Rhythm: Normal rate and regular rhythm.     Pulses: Normal pulses.     Heart sounds: Normal heart sounds. No murmur heard.    No friction rub. No gallop.  Pulmonary:     Effort: Pulmonary effort is normal. No respiratory distress.     Breath sounds: Normal breath sounds. No stridor. No wheezing, rhonchi or rales.  Chest:     Chest wall: No tenderness.  Musculoskeletal:        General: Normal range of motion.     Cervical back: Normal range of motion and neck supple.  Skin:    General: Skin is warm and dry.     Capillary Refill: Capillary refill takes less than 2 seconds.     Coloration: Skin is not jaundiced or pale.     Findings: No bruising, erythema, lesion or rash.  Comments: Redness around laceration on R wrist. No more heat or swelling  Neurological:     General: No focal deficit present.     Mental Status: She is alert and oriented to person, place, and time. Mental status is at baseline.  Psychiatric:        Mood and Affect: Mood normal.        Behavior: Behavior normal.        Thought Content: Thought content normal.        Judgment: Judgment normal.     Results for orders placed or performed in visit on 02/11/22  Comprehensive metabolic panel  Result Value Ref Range   Glucose 96 70 - 99 mg/dL   BUN 28 (H) 8 - 27 mg/dL   Creatinine, Ser 1.09 (H) 0.57 - 1.00 mg/dL   eGFR 53 (L) >59 mL/min/1.73   BUN/Creatinine Ratio 26 12 - 28   Sodium 140 134 - 144 mmol/L   Potassium 3.9 3.5 - 5.2 mmol/L   Chloride 103 96 - 106 mmol/L   CO2 18 (L) 20 - 29 mmol/L   Calcium 9.9 8.7 - 10.3 mg/dL   Total Protein 6.5 6.0 - 8.5 g/dL   Albumin 3.8 3.7 - 4.7 g/dL   Globulin, Total  2.7 1.5 - 4.5 g/dL   Albumin/Globulin Ratio 1.4 1.2 - 2.2   Bilirubin Total 0.7 0.0 - 1.2 mg/dL   Alkaline Phosphatase 84 44 - 121 IU/L   AST 17 0 - 40 IU/L   ALT 18 0 - 32 IU/L  Lipid Panel w/o Chol/HDL Ratio  Result Value Ref Range   Cholesterol, Total 166 100 - 199 mg/dL   Triglycerides 76 0 - 149 mg/dL   HDL 53 >39 mg/dL   VLDL Cholesterol Cal 14 5 - 40 mg/dL   LDL Chol Calc (NIH) 99 0 - 99 mg/dL      Assessment & Plan:   Problem List Items Addressed This Visit   None Visit Diagnoses     Cellulitis of left upper extremity    -  Primary   Doing significantly better. Will continue antibiotics. Call with any concerns.         Follow up plan: Return As scheduled.

## 2022-07-28 DIAGNOSIS — Z791 Long term (current) use of non-steroidal anti-inflammatories (NSAID): Secondary | ICD-10-CM | POA: Diagnosis not present

## 2022-07-28 DIAGNOSIS — N3281 Overactive bladder: Secondary | ICD-10-CM | POA: Diagnosis not present

## 2022-07-28 DIAGNOSIS — Z7982 Long term (current) use of aspirin: Secondary | ICD-10-CM | POA: Diagnosis not present

## 2022-07-28 DIAGNOSIS — Z008 Encounter for other general examination: Secondary | ICD-10-CM | POA: Diagnosis not present

## 2022-07-28 DIAGNOSIS — Z6841 Body Mass Index (BMI) 40.0 and over, adult: Secondary | ICD-10-CM | POA: Diagnosis not present

## 2022-07-28 DIAGNOSIS — M62838 Other muscle spasm: Secondary | ICD-10-CM | POA: Diagnosis not present

## 2022-07-28 DIAGNOSIS — I10 Essential (primary) hypertension: Secondary | ICD-10-CM | POA: Diagnosis not present

## 2022-07-28 DIAGNOSIS — Z79899 Other long term (current) drug therapy: Secondary | ICD-10-CM | POA: Diagnosis not present

## 2022-07-28 DIAGNOSIS — M1990 Unspecified osteoarthritis, unspecified site: Secondary | ICD-10-CM | POA: Diagnosis not present

## 2022-07-28 DIAGNOSIS — J309 Allergic rhinitis, unspecified: Secondary | ICD-10-CM | POA: Diagnosis not present

## 2022-07-28 DIAGNOSIS — E785 Hyperlipidemia, unspecified: Secondary | ICD-10-CM | POA: Diagnosis not present

## 2022-07-28 DIAGNOSIS — Z803 Family history of malignant neoplasm of breast: Secondary | ICD-10-CM | POA: Diagnosis not present

## 2022-08-28 DIAGNOSIS — Z7982 Long term (current) use of aspirin: Secondary | ICD-10-CM | POA: Diagnosis not present

## 2022-08-28 DIAGNOSIS — G8918 Other acute postprocedural pain: Secondary | ICD-10-CM | POA: Diagnosis not present

## 2022-08-28 DIAGNOSIS — I10 Essential (primary) hypertension: Secondary | ICD-10-CM | POA: Diagnosis not present

## 2022-08-28 DIAGNOSIS — E7800 Pure hypercholesterolemia, unspecified: Secondary | ICD-10-CM | POA: Diagnosis not present

## 2022-08-28 DIAGNOSIS — M21161 Varus deformity, not elsewhere classified, right knee: Secondary | ICD-10-CM | POA: Diagnosis not present

## 2022-08-28 DIAGNOSIS — M21162 Varus deformity, not elsewhere classified, left knee: Secondary | ICD-10-CM | POA: Diagnosis not present

## 2022-08-28 DIAGNOSIS — J309 Allergic rhinitis, unspecified: Secondary | ICD-10-CM | POA: Diagnosis not present

## 2022-08-28 DIAGNOSIS — N3281 Overactive bladder: Secondary | ICD-10-CM | POA: Diagnosis not present

## 2022-08-28 DIAGNOSIS — Z8261 Family history of arthritis: Secondary | ICD-10-CM | POA: Diagnosis not present

## 2022-08-28 DIAGNOSIS — E785 Hyperlipidemia, unspecified: Secondary | ICD-10-CM | POA: Diagnosis not present

## 2022-08-28 DIAGNOSIS — M1711 Unilateral primary osteoarthritis, right knee: Secondary | ICD-10-CM | POA: Diagnosis not present

## 2022-08-28 DIAGNOSIS — M170 Bilateral primary osteoarthritis of knee: Secondary | ICD-10-CM | POA: Diagnosis not present

## 2022-08-28 DIAGNOSIS — Z23 Encounter for immunization: Secondary | ICD-10-CM | POA: Diagnosis not present

## 2022-08-28 HISTORY — PX: REPLACEMENT TOTAL KNEE: SUR1224

## 2022-08-29 DIAGNOSIS — M170 Bilateral primary osteoarthritis of knee: Secondary | ICD-10-CM | POA: Diagnosis not present

## 2022-08-29 DIAGNOSIS — Z23 Encounter for immunization: Secondary | ICD-10-CM | POA: Diagnosis not present

## 2022-08-29 DIAGNOSIS — N3281 Overactive bladder: Secondary | ICD-10-CM | POA: Diagnosis not present

## 2022-08-29 DIAGNOSIS — Z8261 Family history of arthritis: Secondary | ICD-10-CM | POA: Diagnosis not present

## 2022-08-29 DIAGNOSIS — M21162 Varus deformity, not elsewhere classified, left knee: Secondary | ICD-10-CM | POA: Diagnosis not present

## 2022-08-29 DIAGNOSIS — J309 Allergic rhinitis, unspecified: Secondary | ICD-10-CM | POA: Diagnosis not present

## 2022-08-29 DIAGNOSIS — Z7982 Long term (current) use of aspirin: Secondary | ICD-10-CM | POA: Diagnosis not present

## 2022-08-29 DIAGNOSIS — E7800 Pure hypercholesterolemia, unspecified: Secondary | ICD-10-CM | POA: Diagnosis not present

## 2022-08-29 DIAGNOSIS — I10 Essential (primary) hypertension: Secondary | ICD-10-CM | POA: Diagnosis not present

## 2022-08-29 DIAGNOSIS — M21161 Varus deformity, not elsewhere classified, right knee: Secondary | ICD-10-CM | POA: Diagnosis not present

## 2022-08-30 DIAGNOSIS — Z5982 Transportation insecurity: Secondary | ICD-10-CM | POA: Diagnosis not present

## 2022-08-30 DIAGNOSIS — Z96651 Presence of right artificial knee joint: Secondary | ICD-10-CM | POA: Diagnosis not present

## 2022-08-30 DIAGNOSIS — Z4889 Encounter for other specified surgical aftercare: Secondary | ICD-10-CM | POA: Diagnosis not present

## 2022-08-30 DIAGNOSIS — Z9181 History of falling: Secondary | ICD-10-CM | POA: Diagnosis not present

## 2022-08-30 DIAGNOSIS — M1712 Unilateral primary osteoarthritis, left knee: Secondary | ICD-10-CM | POA: Diagnosis not present

## 2022-08-30 DIAGNOSIS — R69 Illness, unspecified: Secondary | ICD-10-CM | POA: Diagnosis not present

## 2022-08-30 DIAGNOSIS — Z7982 Long term (current) use of aspirin: Secondary | ICD-10-CM | POA: Diagnosis not present

## 2022-08-30 DIAGNOSIS — I10 Essential (primary) hypertension: Secondary | ICD-10-CM | POA: Diagnosis not present

## 2022-08-30 DIAGNOSIS — Z471 Aftercare following joint replacement surgery: Secondary | ICD-10-CM | POA: Diagnosis not present

## 2022-09-01 DIAGNOSIS — Z7982 Long term (current) use of aspirin: Secondary | ICD-10-CM | POA: Diagnosis not present

## 2022-09-01 DIAGNOSIS — M1712 Unilateral primary osteoarthritis, left knee: Secondary | ICD-10-CM | POA: Diagnosis not present

## 2022-09-01 DIAGNOSIS — Z96651 Presence of right artificial knee joint: Secondary | ICD-10-CM | POA: Diagnosis not present

## 2022-09-01 DIAGNOSIS — Z9181 History of falling: Secondary | ICD-10-CM | POA: Diagnosis not present

## 2022-09-01 DIAGNOSIS — Z5982 Transportation insecurity: Secondary | ICD-10-CM | POA: Diagnosis not present

## 2022-09-01 DIAGNOSIS — R69 Illness, unspecified: Secondary | ICD-10-CM | POA: Diagnosis not present

## 2022-09-01 DIAGNOSIS — I10 Essential (primary) hypertension: Secondary | ICD-10-CM | POA: Diagnosis not present

## 2022-09-01 DIAGNOSIS — Z471 Aftercare following joint replacement surgery: Secondary | ICD-10-CM | POA: Diagnosis not present

## 2022-09-03 DIAGNOSIS — Z471 Aftercare following joint replacement surgery: Secondary | ICD-10-CM | POA: Diagnosis not present

## 2022-09-03 DIAGNOSIS — I10 Essential (primary) hypertension: Secondary | ICD-10-CM | POA: Diagnosis not present

## 2022-09-03 DIAGNOSIS — R69 Illness, unspecified: Secondary | ICD-10-CM | POA: Diagnosis not present

## 2022-09-03 DIAGNOSIS — Z9181 History of falling: Secondary | ICD-10-CM | POA: Diagnosis not present

## 2022-09-03 DIAGNOSIS — Z7982 Long term (current) use of aspirin: Secondary | ICD-10-CM | POA: Diagnosis not present

## 2022-09-03 DIAGNOSIS — Z5982 Transportation insecurity: Secondary | ICD-10-CM | POA: Diagnosis not present

## 2022-09-03 DIAGNOSIS — Z96651 Presence of right artificial knee joint: Secondary | ICD-10-CM | POA: Diagnosis not present

## 2022-09-03 DIAGNOSIS — M1712 Unilateral primary osteoarthritis, left knee: Secondary | ICD-10-CM | POA: Diagnosis not present

## 2022-09-05 DIAGNOSIS — M1712 Unilateral primary osteoarthritis, left knee: Secondary | ICD-10-CM | POA: Diagnosis not present

## 2022-09-05 DIAGNOSIS — I10 Essential (primary) hypertension: Secondary | ICD-10-CM | POA: Diagnosis not present

## 2022-09-05 DIAGNOSIS — Z471 Aftercare following joint replacement surgery: Secondary | ICD-10-CM | POA: Diagnosis not present

## 2022-09-05 DIAGNOSIS — Z5982 Transportation insecurity: Secondary | ICD-10-CM | POA: Diagnosis not present

## 2022-09-05 DIAGNOSIS — Z9181 History of falling: Secondary | ICD-10-CM | POA: Diagnosis not present

## 2022-09-05 DIAGNOSIS — Z96651 Presence of right artificial knee joint: Secondary | ICD-10-CM | POA: Diagnosis not present

## 2022-09-05 DIAGNOSIS — R69 Illness, unspecified: Secondary | ICD-10-CM | POA: Diagnosis not present

## 2022-09-05 DIAGNOSIS — Z7982 Long term (current) use of aspirin: Secondary | ICD-10-CM | POA: Diagnosis not present

## 2022-09-06 ENCOUNTER — Other Ambulatory Visit: Payer: Self-pay | Admitting: Family Medicine

## 2022-09-07 ENCOUNTER — Ambulatory Visit
Admission: RE | Admit: 2022-09-07 | Discharge: 2022-09-07 | Disposition: A | Discharge status: Home / Self Care | Payer: Medicare HMO | Source: Ambulatory Visit | Attending: Student | Admitting: Student

## 2022-09-07 ENCOUNTER — Other Ambulatory Visit: Payer: Self-pay | Admitting: Student

## 2022-09-07 ENCOUNTER — Ambulatory Visit
Admission: RE | Admit: 2022-09-07 | Discharge: 2022-09-07 | Disposition: A | Discharge status: Home / Self Care | Payer: Medicare HMO | Source: Other Acute Inpatient Hospital | Attending: Student | Admitting: Student

## 2022-09-07 DIAGNOSIS — R52 Pain, unspecified: Secondary | ICD-10-CM | POA: Insufficient documentation

## 2022-09-07 DIAGNOSIS — M7989 Other specified soft tissue disorders: Secondary | ICD-10-CM | POA: Diagnosis not present

## 2022-09-08 NOTE — Telephone Encounter (Signed)
Requested Prescriptions  Pending Prescriptions Disp Refills  . lisinopril-hydrochlorothiazide (ZESTORETIC) 20-25 MG tablet [Pharmacy Med Name: LISINOPRIL-HCTZ 20-25 MG TAB] 90 tablet 1    Sig: TAKE 1 TABLET BY MOUTH EVERY DAY     Cardiovascular:  ACEI + Diuretic Combos Failed - 09/06/2022  9:14 AM      Failed - Na in normal range and within 180 days    Sodium  Date Value Ref Range Status  02/11/2022 140 134 - 144 mmol/L Final         Failed - K in normal range and within 180 days    Potassium  Date Value Ref Range Status  02/11/2022 3.9 3.5 - 5.2 mmol/L Final         Failed - Cr in normal range and within 180 days    Creatinine, Ser  Date Value Ref Range Status  02/11/2022 1.09 (H) 0.57 - 1.00 mg/dL Final         Failed - eGFR is 30 or above and within 180 days    GFR calc Af Amer  Date Value Ref Range Status  09/10/2020 75 >59 mL/min/1.73 Final    Comment:    **In accordance with recommendations from the NKF-ASN Task force,**   [facility] is in the process of updating its eGFR calculation to the   2021 CKD-EPI creatinine equation that estimates kidney function   without a race variable.    GFR calc non Af Amer  Date Value Ref Range Status  09/10/2020 65 >59 mL/min/1.73 Final   eGFR  Date Value Ref Range Status  02/11/2022 53 (L) >59 mL/min/1.73 Final         Passed - Patient is not pregnant      Passed - Last BP in normal range    BP Readings from Last 1 Encounters:  07/22/22 121/70         Passed - Valid encounter within last 6 months    Recent Outpatient Visits          1 month ago Cellulitis of left upper extremity   [place], Megan P, DO   1 month ago Cellulitis of left upper extremity   [place], Megan P, DO   6 months ago Upper respiratory tract infection, unspecified type   [facility] [place], Megan P, DO   6 months ago Primary hypertension   [place], Megan P, DO    12 months ago Routine general medical examination at a health care facility   [place], [place], DO      Future Appointments            In 1 week Wynetta Emery, Barb Merino, DO [facility], [place]

## 2022-09-09 DIAGNOSIS — M1712 Unilateral primary osteoarthritis, left knee: Secondary | ICD-10-CM | POA: Diagnosis not present

## 2022-09-09 DIAGNOSIS — Z5982 Transportation insecurity: Secondary | ICD-10-CM | POA: Diagnosis not present

## 2022-09-09 DIAGNOSIS — Z9181 History of falling: Secondary | ICD-10-CM | POA: Diagnosis not present

## 2022-09-09 DIAGNOSIS — R69 Illness, unspecified: Secondary | ICD-10-CM | POA: Diagnosis not present

## 2022-09-09 DIAGNOSIS — I10 Essential (primary) hypertension: Secondary | ICD-10-CM | POA: Diagnosis not present

## 2022-09-09 DIAGNOSIS — Z471 Aftercare following joint replacement surgery: Secondary | ICD-10-CM | POA: Diagnosis not present

## 2022-09-09 DIAGNOSIS — Z96651 Presence of right artificial knee joint: Secondary | ICD-10-CM | POA: Diagnosis not present

## 2022-09-09 DIAGNOSIS — Z7982 Long term (current) use of aspirin: Secondary | ICD-10-CM | POA: Diagnosis not present

## 2022-09-11 DIAGNOSIS — M1712 Unilateral primary osteoarthritis, left knee: Secondary | ICD-10-CM | POA: Diagnosis not present

## 2022-09-11 DIAGNOSIS — Z471 Aftercare following joint replacement surgery: Secondary | ICD-10-CM | POA: Diagnosis not present

## 2022-09-11 DIAGNOSIS — Z9181 History of falling: Secondary | ICD-10-CM | POA: Diagnosis not present

## 2022-09-11 DIAGNOSIS — Z96651 Presence of right artificial knee joint: Secondary | ICD-10-CM | POA: Diagnosis not present

## 2022-09-11 DIAGNOSIS — R69 Illness, unspecified: Secondary | ICD-10-CM | POA: Diagnosis not present

## 2022-09-11 DIAGNOSIS — Z5982 Transportation insecurity: Secondary | ICD-10-CM | POA: Diagnosis not present

## 2022-09-11 DIAGNOSIS — Z7982 Long term (current) use of aspirin: Secondary | ICD-10-CM | POA: Diagnosis not present

## 2022-09-11 DIAGNOSIS — I10 Essential (primary) hypertension: Secondary | ICD-10-CM | POA: Diagnosis not present

## 2022-09-15 DIAGNOSIS — M25661 Stiffness of right knee, not elsewhere classified: Secondary | ICD-10-CM | POA: Diagnosis not present

## 2022-09-15 DIAGNOSIS — M25561 Pain in right knee: Secondary | ICD-10-CM | POA: Diagnosis not present

## 2022-09-16 ENCOUNTER — Encounter: Payer: Self-pay | Admitting: Family Medicine

## 2022-09-16 ENCOUNTER — Ambulatory Visit (INDEPENDENT_AMBULATORY_CARE_PROVIDER_SITE_OTHER): Payer: Medicare HMO | Admitting: Family Medicine

## 2022-09-16 VITALS — BP 115/67 | HR 68 | Temp 97.7°F | Wt 238.6 lb

## 2022-09-16 DIAGNOSIS — I10 Essential (primary) hypertension: Secondary | ICD-10-CM | POA: Diagnosis not present

## 2022-09-16 DIAGNOSIS — Z23 Encounter for immunization: Secondary | ICD-10-CM | POA: Diagnosis not present

## 2022-09-16 DIAGNOSIS — E7800 Pure hypercholesterolemia, unspecified: Secondary | ICD-10-CM | POA: Diagnosis not present

## 2022-09-16 DIAGNOSIS — Z0000 Encounter for general adult medical examination without abnormal findings: Secondary | ICD-10-CM

## 2022-09-16 DIAGNOSIS — N3281 Overactive bladder: Secondary | ICD-10-CM | POA: Diagnosis not present

## 2022-09-16 DIAGNOSIS — D692 Other nonthrombocytopenic purpura: Secondary | ICD-10-CM | POA: Diagnosis not present

## 2022-09-16 LAB — URINALYSIS, ROUTINE W REFLEX MICROSCOPIC
Bilirubin, UA: NEGATIVE
Glucose, UA: NEGATIVE
Ketones, UA: NEGATIVE
Leukocytes,UA: NEGATIVE
Nitrite, UA: NEGATIVE
Protein,UA: NEGATIVE
RBC, UA: NEGATIVE
Specific Gravity, UA: 1.015 (ref 1.005–1.030)
Urobilinogen, Ur: 1 mg/dL (ref 0.2–1.0)
pH, UA: 7 (ref 5.0–7.5)

## 2022-09-16 LAB — MICROALBUMIN, URINE WAIVED
Creatinine, Urine Waived: 50 mg/dL (ref 10–300)
Microalb, Ur Waived: 10 mg/L (ref 0–19)
Microalb/Creat Ratio: <30 mg/g (ref <30)

## 2022-09-16 MED ORDER — OXYBUTYNIN CHLORIDE 5 MG PO TABS: ORAL_TABLET | ORAL | 3 refills | Status: DC

## 2022-09-16 MED ORDER — PRAVASTATIN SODIUM 20 MG PO TABS: ORAL_TABLET | ORAL | 1 refills | Status: DC

## 2022-09-16 MED ORDER — FLUTICASONE PROPIONATE 50 MCG/ACT NA SUSP: NASAL | 12 refills | Status: DC

## 2022-09-16 NOTE — Assessment & Plan Note (Signed)
Under good control on current regimen. Continue current regimen. Continue to monitor. Call with any concerns. Refills given. Labs drawn today.   

## 2022-09-16 NOTE — Progress Notes (Signed)
BP 115/67   Pulse 68   Temp 97.7 F (36.5 C)   Wt 238 lb 9.6 oz (108.2 kg)   SpO2 99%   BMI 40.96 kg/m    Subjective:    Patient ID: Felicia Vargas, female    DOB: 25-Feb-1946, 76 y.o.   MRN: [id number]  HPI: Felicia Vargas is a 76 y.o. female presenting on 09/16/2022 for comprehensive medical examination. Current medical complaints include:  Recovering from knee surgery, she has been having some redness  HYPERTENSION / HYPERLIPIDEMIA Satisfied with current treatment? yes Duration of hypertension: chronic BP monitoring frequency: a few times a week BP medication side effects: no Past BP meds: lisinopril, HCTZ Duration of hyperlipidemia: chronic Cholesterol medication side effects: no Cholesterol supplements: fish oil Past cholesterol medications: pravastatin Medication compliance: excellent compliance Aspirin: yes Recent stressors: no Recurrent headaches: no Visual changes: no Palpitations: no Dyspnea: no Chest pain: no Lower extremity edema: no Dizzy/lightheaded: no  She currently lives with: daughter Menopausal Symptoms: no  Depression Screen done today and results listed below:     09/16/2022    9:26 AM 07/18/2022   11:02 AM 02/28/2022    9:33 AM 02/24/2022   10:44 AM 02/11/2022    9:01 AM  Depression screen PHQ 2/9  Decreased Interest 0 0 0 0 0  Down, Depressed, Hopeless 0 0 0 0 0  PHQ - 2 Score 0 0 0 0 0  Altered sleeping 0 1 1 0 1  Tired, decreased energy 0 0 1 0 0  Change in appetite 0 0 0 0 0  Feeling bad or failure about yourself  0 0 0 0 0  Trouble concentrating 0 0 0 0 0  Moving slowly or fidgety/restless 0 0 0 0 0  Suicidal thoughts 0 0 0 0 0  PHQ-9 Score 0 1 2 0 1  Difficult doing work/chores Not difficult at all        Past Medical History:  Past Medical History:  Diagnosis Date   Allergy    Arthritis    Cancer ([place])    skin   Diffuse cystic mastopathy 2013   High cholesterol    Hypertension 1990's   Migraine    Migraines     Overactive bladder    Urinary incontinence     Surgical History:  Past Surgical History:  Procedure Laterality Date   BREAST BIOPSY Right 05/31/1993   neg/lump   COLONOSCOPY  11/2004   Dr. Chauncey [place]   COLONOSCOPY WITH PROPOFOL N/A 10/15/2016   Procedure: COLONOSCOPY WITH PROPOFOL;  Surgeon: Christene Lye, MD;  Location: [facility] ENDOSCOPY;  Service: Endoscopy;  Laterality: N/A;   [place] Right 08/28/2022   shin surgery Left 02/04/2021   SQUAMOUS CELL CARCINOMA EXCISION  02/04/2021   left leg    Medications:  Current Outpatient Medications on File Prior to Visit  Medication Sig   aspirin 81 MG tablet Take 81 mg by mouth daily.   calcium carbonate (OS-CAL) 600 MG TABS Take 600 mg by mouth daily with breakfast.    Cholecalciferol (VITAMIN D PO) Take 1 capsule by mouth daily.   cyclobenzaprine (FLEXERIL) 5 MG tablet Take 1-2 tablets (5-10 mg total) by mouth at bedtime.   docusate sodium (COLACE) 100 MG capsule Take 100 mg by mouth daily.    lisinopril-hydrochlorothiazide (ZESTORETIC) 20-25 MG tablet TAKE 1 TABLET BY MOUTH EVERY DAY   loratadine (CLARITIN) 10 MG tablet  Take 10 mg by mouth daily.   Omega-3 Fatty Acids (FISH OIL PO) Take by mouth in the morning and at bedtime.   oxyCODONE (OXY IR/ROXICODONE) 5 MG immediate release tablet Take 5 mg by mouth every 4 (four) hours.   TURMERIC PO Take 1 tablet by mouth 2 (two) times daily.    No current facility-administered medications on file prior to visit.    Allergies:  No Known Allergies  Social History:  Social History   Socioeconomic History   Marital status: Widowed    Spouse name: Not on file   Number of children: Not on file   Years of education: Not on file   Highest education level: 12th grade  Occupational History   Occupation: retired  Tobacco Use   Smoking status: Never   Smokeless tobacco: Never  Vaping Use   Vaping Use: Never used  Substance and  Sexual Activity   Alcohol use: No   Drug use: No   Sexual activity: Not Currently  Other Topics Concern   Not on file  Social History Narrative   Not on file   Social Determinants of Health   Financial Resource Strain: Low Risk  (02/24/2022)   Overall Financial Resource Strain (CARDIA)    Difficulty of Paying Living Expenses: Not hard at all  Food Insecurity: No Food Insecurity (02/24/2022)   Hunger Vital Sign    Worried About Running Out of Food in the Last Year: Never true    [place] in the Last Year: Never true  Transportation Needs: No Transportation Needs (02/24/2022)   PRAPARE - Hydrologist (Medical): No    Lack of Transportation (Non-Medical): No  Physical Activity: Insufficiently Active (02/24/2022)   Exercise Vital Sign    Days of Exercise per Week: 3 days    Minutes of Exercise per Session: 30 min  Stress: No Stress Concern Present (02/24/2022)   [place]    Feeling of Stress : Not at all  Social Connections: Socially Isolated (02/24/2022)   Social Connection and Isolation Panel [NHANES]    Frequency of Communication with Friends and Family: Twice a week    Frequency of Social Gatherings with Friends and Family: More than three times a week    Attends Religious Services: Never    Marine scientist or Organizations: No    Attends Archivist Meetings: Never    Marital Status: Widowed  Intimate Partner Violence: Not At Risk (02/24/2022)   Humiliation, Afraid, Rape, and Kick questionnaire    Fear of Current or Ex-Partner: No    Emotionally Abused: No    Physically Abused: No    Sexually Abused: No   Social History   Tobacco Use  Smoking Status Never  Smokeless Tobacco Never   Social History   Substance and Sexual Activity  Alcohol Use No    Family History:  Family History  Problem Relation Age of Onset   Lung cancer Father    Stroke Mother     Hypertension Sister    Arthritis Sister    Dementia Sister    Breast cancer Neg Hx     Past medical history, surgical history, medications, allergies, family history and social history reviewed with patient today and changes made to appropriate areas of the chart.   Review of Systems  Constitutional: Negative.   HENT: Negative.    Eyes: Negative.   Respiratory: Negative.  Cardiovascular: Negative.   Gastrointestinal:  Positive for constipation. Negative for abdominal pain, blood in stool, diarrhea, heartburn, melena, nausea and vomiting.  Genitourinary:  Positive for frequency. Negative for dysuria, flank pain, hematuria and urgency.  Musculoskeletal:  Positive for joint pain and myalgias. Negative for back pain, falls and neck pain.  Skin: Negative.   Neurological: Negative.   Endo/Heme/Allergies:  Positive for polydipsia. Negative for environmental allergies. Bruises/bleeds easily.  Psychiatric/Behavioral: Negative.     All other ROS negative except what is listed above and in the HPI.      Objective:    BP 115/67   Pulse 68   Temp 97.7 F (36.5 C)   Wt 238 lb 9.6 oz (108.2 kg)   SpO2 99%   BMI 40.96 kg/m   Wt Readings from Last 3 Encounters:  09/16/22 238 lb 9.6 oz (108.2 kg)  07/22/22 237 lb 1.6 oz (107.5 kg)  07/18/22 237 lb 1.6 oz (107.5 kg)    Physical Exam Vitals and nursing note reviewed.  Constitutional:      General: She is not in acute distress.    Appearance: Normal appearance. She is not ill-appearing, toxic-appearing or diaphoretic.  HENT:     Head: Normocephalic and atraumatic.     Right Ear: Tympanic membrane, ear canal and external ear normal. There is no impacted cerumen.     Left Ear: Tympanic membrane, ear canal and external ear normal. There is no impacted cerumen.     Nose: Nose normal. No congestion or rhinorrhea.     Mouth/Throat:     Mouth: Mucous membranes are moist.     Pharynx: Oropharynx is clear. No oropharyngeal exudate or  posterior oropharyngeal erythema.  Eyes:     General: No scleral icterus.       Right eye: No discharge.        Left eye: No discharge.     Extraocular Movements: Extraocular movements intact.     Conjunctiva/sclera: Conjunctivae normal.     Pupils: Pupils are equal, round, and reactive to light.  Neck:     Vascular: No carotid bruit.  Cardiovascular:     Rate and Rhythm: Normal rate and regular rhythm.     Pulses: Normal pulses.     Heart sounds: No murmur heard.    No friction rub. No gallop.  Pulmonary:     Effort: Pulmonary effort is normal. No respiratory distress.     Breath sounds: Normal breath sounds. No stridor. No wheezing, rhonchi or rales.  Chest:     Chest wall: No tenderness.  Abdominal:     General: Abdomen is flat. Bowel sounds are normal. There is no distension.     Palpations: Abdomen is soft. There is no mass.     Tenderness: There is no abdominal tenderness. There is no right CVA tenderness, left CVA tenderness, guarding or rebound.     Hernia: No hernia is present.  Genitourinary:    Comments: Breast and pelvic exams deferred with shared decision making Musculoskeletal:        General: Swelling (R leg, wound healing well) present. No tenderness, deformity or signs of injury.     Cervical back: Normal range of motion and neck supple. No rigidity. No muscular tenderness.     Right lower leg: No edema.     Left lower leg: No edema.  Lymphadenopathy:     Cervical: No cervical adenopathy.  Skin:    General: Skin is warm and dry.     Capillary  Refill: Capillary refill takes less than 2 seconds.     Coloration: Skin is not jaundiced or pale.     Findings: No bruising, erythema, lesion or rash.  Neurological:     General: No focal deficit present.     Mental Status: She is alert and oriented to person, place, and time. Mental status is at baseline.     Cranial Nerves: No cranial nerve deficit.     Sensory: No sensory deficit.     Motor: No weakness.      Coordination: Coordination normal.     Gait: Gait normal.     Deep Tendon Reflexes: Reflexes normal.  Psychiatric:        Mood and Affect: Mood normal.        Behavior: Behavior normal.        Thought Content: Thought content normal.        Judgment: Judgment normal.     Results for orders placed or performed in visit on 02/11/22  Comprehensive metabolic panel  Result Value Ref Range   Glucose 96 70 - 99 mg/dL   BUN 28 (H) 8 - 27 mg/dL   Creatinine, Ser 1.09 (H) 0.57 - 1.00 mg/dL   eGFR 53 (L) >59 mL/min/1.73   BUN/Creatinine Ratio 26 12 - 28   Sodium 140 134 - 144 mmol/L   Potassium 3.9 3.5 - 5.2 mmol/L   Chloride 103 96 - 106 mmol/L   CO2 18 (L) 20 - 29 mmol/L   Calcium 9.9 8.7 - 10.3 mg/dL   Total Protein 6.5 6.0 - 8.5 g/dL   Albumin 3.8 3.7 - 4.7 g/dL   Globulin, Total 2.7 1.5 - 4.5 g/dL   Albumin/Globulin Ratio 1.4 1.2 - 2.2   Bilirubin Total 0.7 0.0 - 1.2 mg/dL   Alkaline Phosphatase 84 44 - 121 IU/L   AST 17 0 - 40 IU/L   ALT 18 0 - 32 IU/L  Lipid Panel w/o Chol/HDL Ratio  Result Value Ref Range   Cholesterol, Total 166 100 - 199 mg/dL   Triglycerides 76 0 - 149 mg/dL   HDL 53 >39 mg/dL   VLDL Cholesterol Cal 14 5 - 40 mg/dL   LDL Chol Calc (NIH) 99 0 - 99 mg/dL      Assessment & Plan:   Problem List Items Addressed This Visit       Cardiovascular and Mediastinum   Hypertension    Under good control on current regimen. Continue current regimen. Continue to monitor. Call with any concerns. Refills given. Labs drawn today.        Relevant Medications   pravastatin (PRAVACHOL) 20 MG tablet   Other Relevant Orders   Comprehensive metabolic panel   TSH   Microalbumin, Urine Waived   Senile purpura ([place])    Reassured patient. Continue to monitor.       Relevant Medications   pravastatin (PRAVACHOL) 20 MG tablet   Other Relevant Orders   CBC with Differential/Platelet   Comprehensive metabolic panel     Genitourinary   OAB (overactive bladder)     Under good control on current regimen. Continue current regimen. Continue to monitor. Call with any concerns. Refills given. Labs drawn today.        Relevant Orders   Comprehensive metabolic panel   Urinalysis, Routine w reflex microscopic     Other   High cholesterol    Under good control on current regimen. Continue current regimen. Continue to monitor. Call with any concerns. Refills  given. Labs drawn today.        Relevant Medications   pravastatin (PRAVACHOL) 20 MG tablet   Other Relevant Orders   Comprehensive metabolic panel   Lipid Panel w/o Chol/HDL Ratio   Other Visit Diagnoses     Routine general medical examination at a health care facility    -  Primary   Vaccines up to date. Screening labs checked today. Mammogram and DEXA UTD. Continue diet and exercise. Call with any concerns.    Need for influenza vaccination       Flu shot given today.    Relevant Orders   Flu Vaccine QUAD High Dose(Fluad) (Completed)        Follow up plan: Return in about 6 months (around 03/17/2023).   LABORATORY TESTING:  - Pap smear: not applicable  IMMUNIZATIONS:   - Tdap: Tetanus vaccination status reviewed: last tetanus booster within 10 years. - Influenza: Up to date - Pneumovax: Up to date - Prevnar: Up to date - COVID: Up to date - HPV: Not applicable - Shingrix vaccine: Not applicable  SCREENING: -Mammogram: Up to date  - Colonoscopy: Not applicable  - Bone Density: Up to date   PATIENT COUNSELING:   Advised to take 1 mg of folate supplement per day if capable of pregnancy.   Sexuality: Discussed sexually transmitted diseases, partner selection, use of condoms, avoidance of unintended pregnancy  and contraceptive alternatives.   Advised to avoid cigarette smoking.  I discussed with the patient that most people either abstain from alcohol or drink within safe limits (<=14/week and <=4 drinks/occasion for males, <=7/weeks and <= 3 drinks/occasion for females) and  that the risk for alcohol disorders and other health effects rises proportionally with the number of drinks per week and how often a drinker exceeds daily limits.  Discussed cessation/primary prevention of drug use and availability of treatment for abuse.   Diet: Encouraged to adjust caloric intake to maintain  or achieve ideal body weight, to reduce intake of dietary saturated fat and total fat, to limit sodium intake by avoiding high sodium foods and not adding table salt, and to maintain adequate dietary potassium and calcium preferably from fresh fruits, vegetables, and low-fat dairy products.    stressed the importance of regular exercise  Injury prevention: Discussed safety belts, safety helmets, smoke detector, smoking near bedding or upholstery.   Dental health: Discussed importance of regular tooth brushing, flossing, and dental visits.    NEXT PREVENTATIVE PHYSICAL DUE IN 1 YEAR. Return in about 6 months (around 03/17/2023).

## 2022-09-16 NOTE — Assessment & Plan Note (Signed)
Reassured patient. Continue to monitor.  

## 2022-09-17 LAB — COMPREHENSIVE METABOLIC PANEL
ALT: 25 IU/L (ref 0–32)
AST: 26 IU/L (ref 0–40)
Albumin/Globulin Ratio: 1.6 (ref 1.2–2.2)
Albumin: 3.9 g/dL (ref 3.8–4.8)
Alkaline Phosphatase: 103 IU/L (ref 44–121)
BUN/Creatinine Ratio: 23 (ref 12–28)
BUN: 21 mg/dL (ref 8–27)
Bilirubin Total: 0.7 mg/dL (ref 0.0–1.2)
CO2: 21 mmol/L (ref 20–29)
Calcium: 10 mg/dL (ref 8.7–10.3)
Chloride: 103 mmol/L (ref 96–106)
Creatinine, Ser: 0.9 mg/dL (ref 0.57–1.00)
Globulin, Total: 2.4 g/dL (ref 1.5–4.5)
Glucose: 102 mg/dL — ABNORMAL HIGH (ref 70–99)
Potassium: 3.9 mmol/L (ref 3.5–5.2)
Sodium: 138 mmol/L (ref 134–144)
Total Protein: 6.3 g/dL (ref 6.0–8.5)
eGFR: 67 mL/min/{1.73_m2} (ref >59)

## 2022-09-17 LAB — CBC WITH DIFFERENTIAL/PLATELET
Basophils Absolute: 0.1 10*3/uL (ref 0.0–0.2)
Basos: 2 %
EOS (ABSOLUTE): 0.1 10*3/uL (ref 0.0–0.4)
Eos: 2 %
Hematocrit: 32 % — ABNORMAL LOW (ref 34.0–46.6)
Hemoglobin: 10.6 g/dL — ABNORMAL LOW (ref 11.1–15.9)
Immature Grans (Abs): 0 10*3/uL (ref 0.0–0.1)
Immature Granulocytes: 0 %
Lymphocytes Absolute: 1.4 10*3/uL (ref 0.7–3.1)
Lymphs: 23 %
MCH: 32.6 pg (ref 26.6–33.0)
MCHC: 33.1 g/dL (ref 31.5–35.7)
MCV: 99 fL — ABNORMAL HIGH (ref 79–97)
Monocytes Absolute: 0.5 10*3/uL (ref 0.1–0.9)
Monocytes: 8 %
Neutrophils Absolute: 3.9 10*3/uL (ref 1.4–7.0)
Neutrophils: 65 %
Platelets: 305 10*3/uL (ref 150–450)
RBC: 3.25 x10E6/uL — ABNORMAL LOW (ref 3.77–5.28)
RDW: 11.8 % (ref 11.7–15.4)
WBC: 6 10*3/uL (ref 3.4–10.8)

## 2022-09-17 LAB — LIPID PANEL W/O CHOL/HDL RATIO
Cholesterol, Total: 135 mg/dL (ref 100–199)
HDL: 47 mg/dL (ref >39)
LDL Chol Calc (NIH): 71 mg/dL (ref 0–99)
Triglycerides: 89 mg/dL (ref 0–149)
VLDL Cholesterol Cal: 17 mg/dL (ref 5–40)

## 2022-09-17 LAB — TSH: TSH: 1.39 u[IU]/mL (ref 0.450–4.500)

## 2022-09-22 DIAGNOSIS — M25661 Stiffness of right knee, not elsewhere classified: Secondary | ICD-10-CM | POA: Diagnosis not present

## 2022-09-25 DIAGNOSIS — M25661 Stiffness of right knee, not elsewhere classified: Secondary | ICD-10-CM | POA: Diagnosis not present

## 2022-09-29 DIAGNOSIS — M25661 Stiffness of right knee, not elsewhere classified: Secondary | ICD-10-CM | POA: Diagnosis not present

## 2022-10-06 DIAGNOSIS — M25661 Stiffness of right knee, not elsewhere classified: Secondary | ICD-10-CM | POA: Diagnosis not present

## 2022-10-06 DIAGNOSIS — Z96651 Presence of right artificial knee joint: Secondary | ICD-10-CM | POA: Diagnosis not present

## 2022-10-17 DIAGNOSIS — M25661 Stiffness of right knee, not elsewhere classified: Secondary | ICD-10-CM | POA: Diagnosis not present

## 2022-10-22 DIAGNOSIS — M25661 Stiffness of right knee, not elsewhere classified: Secondary | ICD-10-CM | POA: Diagnosis not present

## 2022-10-24 DIAGNOSIS — M25661 Stiffness of right knee, not elsewhere classified: Secondary | ICD-10-CM | POA: Diagnosis not present

## 2022-10-29 DIAGNOSIS — M25661 Stiffness of right knee, not elsewhere classified: Secondary | ICD-10-CM | POA: Diagnosis not present

## 2022-10-31 DIAGNOSIS — M25661 Stiffness of right knee, not elsewhere classified: Secondary | ICD-10-CM | POA: Diagnosis not present

## 2022-11-04 DIAGNOSIS — M25661 Stiffness of right knee, not elsewhere classified: Secondary | ICD-10-CM | POA: Diagnosis not present

## 2022-11-06 DIAGNOSIS — M25661 Stiffness of right knee, not elsewhere classified: Secondary | ICD-10-CM | POA: Diagnosis not present

## 2022-11-12 DIAGNOSIS — M25661 Stiffness of right knee, not elsewhere classified: Secondary | ICD-10-CM | POA: Diagnosis not present

## 2022-11-28 DIAGNOSIS — M25661 Stiffness of right knee, not elsewhere classified: Secondary | ICD-10-CM | POA: Diagnosis not present

## 2022-12-02 DIAGNOSIS — M25661 Stiffness of right knee, not elsewhere classified: Secondary | ICD-10-CM | POA: Diagnosis not present

## 2022-12-08 DIAGNOSIS — M25661 Stiffness of right knee, not elsewhere classified: Secondary | ICD-10-CM | POA: Diagnosis not present

## 2022-12-15 DIAGNOSIS — M25661 Stiffness of right knee, not elsewhere classified: Secondary | ICD-10-CM | POA: Diagnosis not present

## 2022-12-26 DIAGNOSIS — M25661 Stiffness of right knee, not elsewhere classified: Secondary | ICD-10-CM | POA: Diagnosis not present

## 2023-01-07 DIAGNOSIS — M25661 Stiffness of right knee, not elsewhere classified: Secondary | ICD-10-CM | POA: Diagnosis not present

## 2023-01-08 DIAGNOSIS — L821 Other seborrheic keratosis: Secondary | ICD-10-CM | POA: Diagnosis not present

## 2023-01-08 DIAGNOSIS — Z872 Personal history of diseases of the skin and subcutaneous tissue: Secondary | ICD-10-CM | POA: Diagnosis not present

## 2023-01-08 DIAGNOSIS — L578 Other skin changes due to chronic exposure to nonionizing radiation: Secondary | ICD-10-CM | POA: Diagnosis not present

## 2023-01-08 DIAGNOSIS — L570 Actinic keratosis: Secondary | ICD-10-CM | POA: Diagnosis not present

## 2023-01-08 DIAGNOSIS — Z859 Personal history of malignant neoplasm, unspecified: Secondary | ICD-10-CM | POA: Diagnosis not present

## 2023-01-13 DIAGNOSIS — M25661 Stiffness of right knee, not elsewhere classified: Secondary | ICD-10-CM | POA: Diagnosis not present

## 2023-01-22 DIAGNOSIS — M25661 Stiffness of right knee, not elsewhere classified: Secondary | ICD-10-CM | POA: Diagnosis not present

## 2023-01-23 ENCOUNTER — Telehealth: Payer: Self-pay | Admitting: Family Medicine

## 2023-01-23 NOTE — Telephone Encounter (Signed)
[place] to schedule their annual wellness visit. Appointment made for 03/02/2023.  Sherol Dade; Care Guide Ambulatory Clinical [facility] Direct Dial: [phone]

## 2023-01-28 DIAGNOSIS — M25661 Stiffness of right knee, not elsewhere classified: Secondary | ICD-10-CM | POA: Diagnosis not present

## 2023-02-04 DIAGNOSIS — M25661 Stiffness of right knee, not elsewhere classified: Secondary | ICD-10-CM | POA: Diagnosis not present

## 2023-02-10 DIAGNOSIS — M25661 Stiffness of right knee, not elsewhere classified: Secondary | ICD-10-CM | POA: Diagnosis not present

## 2023-02-17 DIAGNOSIS — M25661 Stiffness of right knee, not elsewhere classified: Secondary | ICD-10-CM | POA: Diagnosis not present

## 2023-02-24 DIAGNOSIS — M25661 Stiffness of right knee, not elsewhere classified: Secondary | ICD-10-CM | POA: Diagnosis not present

## 2023-03-02 ENCOUNTER — Ambulatory Visit (INDEPENDENT_AMBULATORY_CARE_PROVIDER_SITE_OTHER): Payer: Medicare HMO

## 2023-03-02 VITALS — Wt 238.0 lb

## 2023-03-02 DIAGNOSIS — Z0000 Encounter for general adult medical examination without abnormal findings: Secondary | ICD-10-CM

## 2023-03-02 NOTE — Progress Notes (Signed)
I connected with  Charlton Amor on 03/02/23 by a audio enabled telemedicine application and verified that I am speaking with the correct person using two identifiers.  Patient Location: Home  Provider Location: Office/Clinic  I discussed the limitations of evaluation and management by telemedicine. The patient expressed understanding and agreed to proceed.  Subjective:   Felicia Vargas is a 77 y.o. female who presents for Medicare Annual (Subsequent) preventive examination.  Review of Systems     Cardiac Risk Factors include: advanced age (>3men, >55 women);hypertension     Objective:    There were no vitals filed for this visit. There is no height or weight on file to calculate BMI.     03/02/2023   10:10 AM 02/24/2022   10:38 AM 02/22/2021   10:40 AM 02/13/2020    3:35 PM 02/10/2019    3:26 PM 02/01/2018    3:13 PM 02/02/2017    1:23 PM  Advanced Directives  Does Patient Have a Medical Advance Directive? No Yes Yes Yes Yes Yes Yes  Type of Science writer of [place];Living will Living will;Healthcare Power of Attorney Living will;Healthcare Power of [employer] Power of [place];Living will Healthcare Power of [place];Living will  Does patient want to make changes to medical advance directive?       No - Patient declined  Copy of Healthcare Power of Attorney in Chart?  No - copy requested No - copy requested No - copy requested  No - copy requested No - copy requested  Would patient like information on creating a medical advance directive? No - Patient declined          Current Medications (verified) Outpatient Encounter Medications as of 03/02/2023  Medication Sig   aspirin 81 MG tablet Take 81 mg by mouth daily.   calcium carbonate (OS-CAL) 600 MG TABS Take 600 mg by mouth daily with breakfast.    Cholecalciferol (VITAMIN D PO) Take 1 capsule by mouth daily.   cyclobenzaprine (FLEXERIL) 5 MG tablet Take 1-2  tablets (5-10 mg total) by mouth at bedtime.   docusate sodium (COLACE) 100 MG capsule Take 100 mg by mouth daily.    fluticasone (FLONASE) 50 MCG/ACT nasal spray SPRAY 1 SPRAY INTO EACH NOSTRIL DAILY   lisinopril-hydrochlorothiazide (ZESTORETIC) 20-25 MG tablet TAKE 1 TABLET BY MOUTH EVERY DAY   loratadine (CLARITIN) 10 MG tablet Take 10 mg by mouth daily.   Omega-3 Fatty Acids (FISH OIL PO) Take by mouth in the morning and at bedtime.   oxybutynin (DITROPAN) 5 MG tablet TAKE 1 TABLET (5 MG) BY ORAL ROUTE 2 TIMES PER DAY   pravastatin (PRAVACHOL) 20 MG tablet TAKE 1 TABLET BY MOUTH EVERYDAY AT BEDTIME   TURMERIC PO Take 1 tablet by mouth 2 (two) times daily.    oxyCODONE (OXY IR/ROXICODONE) 5 MG immediate release tablet Take 5 mg by mouth every 4 (four) hours. (Patient not taking: Reported on 03/02/2023)   No facility-administered encounter medications on file as of 03/02/2023.    Allergies (verified) Patient has no known allergies.   History: Past Medical History:  Diagnosis Date   Allergy    Arthritis    Cancer    skin   Diffuse cystic mastopathy 2013   High cholesterol    Hypertension 1990's   Migraine    Migraines    Overactive bladder    Urinary incontinence    Past Surgical History:  Procedure Laterality Date   BREAST BIOPSY  Right 05/31/1993   neg/lump   COLONOSCOPY  11/2004   Dr. 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   shin surgery Left 02/04/2021   SQUAMOUS CELL CARCINOMA EXCISION  02/04/2021   left leg   Family History  Problem Relation Age of Onset   Lung cancer Father    Stroke Mother    Hypertension Sister    Arthritis Sister    Dementia Sister    Breast cancer Neg Hx    Social History   Socioeconomic History    Marital status: Widowed    Spouse name: Not on file   Number of children: Not on file   Years of education: Not on file   Highest education level: 12th grade  Occupational History   Occupation: retired  Tobacco Use   Smoking status: Never   Smokeless tobacco: Never  Vaping Use   Vaping Use: Never used  Substance and Sexual Activity   Alcohol use: No   Drug use: No   Sexual activity: Not Currently  Other Topics Concern   Not on file  Social History Narrative   Not on file   Social Determinants of Health   Financial Resource Strain: Low Risk  (03/02/2023)   Overall Financial Resource Strain (CARDIA)    Difficulty of Paying Living Expenses: Not hard at all  Food Insecurity: No Food Insecurity (03/02/2023)   Hunger Vital Sign    Worried About Running Out of Food in the Last Year: Never true    Ran Out of Food in the Last Year: Never true  Transportation Needs: No Transportation Needs (03/02/2023)   PRAPARE - Administrator, Civil Service (Medical): No    Lack of Transportation (Non-Medical): No  Physical Activity: Insufficiently Active (03/02/2023)   Exercise Vital Sign    Days of Exercise per Week: 7 days    Minutes of Exercise per Session: 20 min  Stress: No Stress Concern Present (03/02/2023)   [employer] of Occupational Health - Occupational Stress Questionnaire    Feeling of Stress : Not at all  Social Connections: Socially Isolated (03/02/2023)   Social Connection and Isolation Panel [NHANES]    Frequency of Communication with Friends and Family: More than three times a week    Frequency of Social Gatherings with Friends and Family: More than three times a week    Attends Religious Services: Never    Database administrator or Organizations: No    Attends Banker Meetings: Never    Marital Status: Widowed    Tobacco Counseling Counseling given: Not Answered   Clinical Intake:  Pre-visit preparation completed: Yes  Pain : No/denies  pain     Nutritional Risks: None Diabetes: No  How often do you need to have someone help you when you read instructions, pamphlets, or other written materials from your doctor or pharmacy?: 1 - Never  Diabetic?no  Interpreter Needed?: No  Information entered by :: Kennedy Bucker, LPN   Activities of Daily Living    03/02/2023   10:11 AM  In your present state of health, do you have any difficulty performing the following activities:  Hearing? 0  Vision? 0  Difficulty concentrating or making decisions? 0  Walking or climbing stairs? 1  Comment knees  Dressing or bathing? 0  Doing errands, shopping? 0  Preparing Food and eating ? N  Using the Toilet? N  In the past six months, have you accidently leaked urine? N  Do you have problems with loss of bowel control? N  Managing your Medications? N  Managing your Finances? N  Housekeeping or managing your Housekeeping? N    Patient Care Team: Dorcas Carrow, DO as PCP - General (Family Medicine) Jim Like, RN as Registered Nurse Candice Camp, MD (Ophthalmology) Tommas Olp, PA (Dermatology) Jesusita Oka, MD (Dermatology)  Indicate any recent Medical Services you may have received from other than [facility] providers in the past year (date may be approximate).     Assessment:   This is a routine wellness examination for Felicia Vargas.  Hearing/Vision screen Hearing Screening - Comments:: No aids Vision Screening - Comments:: Wears glasses- [facility]  Dietary issues and exercise activities discussed: Current Exercise Habits: Home exercise routine, Type of exercise: walking, Time (Minutes): 20, Frequency (Times/Week): 7, Weekly Exercise (Minutes/Week): 140, Intensity: Mild   Goals Addressed             This Visit's Progress    DIET - EAT MORE FRUITS AND VEGETABLES         Depression Screen    03/02/2023   10:07 AM 09/16/2022    9:26 AM 07/18/2022   11:02 AM 02/28/2022    9:33 AM  02/24/2022   10:44 AM 02/11/2022    9:01 AM 09/10/2021    8:55 AM  PHQ 2/9 Scores  PHQ - 2 Score 0 0 0 0 0 0 0  PHQ- 9 Score 0 0 1 2 0 1 1    Fall Risk    03/02/2023   10:10 AM 02/28/2022    9:33 AM 02/24/2022   10:33 AM 02/11/2022    9:01 AM 09/10/2021    8:31 AM  Fall Risk   Falls in the past year? 0 1 0 1 1  Number falls in past yr: 0 0 0 0 0  Injury with Fall? 0 0 0 1 0  Risk for fall due to : No Fall Risks History of fall(s);Impaired balance/gait Impaired balance/gait Impaired balance/gait   Follow up Falls prevention discussed;Falls evaluation completed Falls evaluation completed Falls evaluation completed;Education provided;Falls prevention discussed Falls evaluation completed Education provided    FALL RISK PREVENTION PERTAINING TO THE HOME:  Any stairs in or around the home? No  If so, are there any without handrails? No  Home free of loose throw rugs in walkways, pet beds, electrical cords, etc? Yes  Adequate lighting in your home to reduce risk of falls? Yes   ASSISTIVE DEVICES UTILIZED TO PREVENT FALLS:  Life alert? No  Use of a cane, walker or w/c? Yes - cane Grab bars in the bathroom? No  Shower chair or bench in shower? No  Elevated toilet seat or a handicapped toilet? No   Cognitive Function:        03/02/2023   10:12 AM 02/24/2022   10:39 AM 02/22/2021   10:44 AM 02/10/2019    3:29 PM 02/10/2019    3:27 PM  6CIT Screen  What Year? 0 points 0 points 0 points 0 points 0 points  What month? 0 points 0 points 0 points 0 points 0 points  What time? 0 points 0 points 0 points 0 points 0 points  Count back from 20 0 points 0 points 0 points 0 points  0 points  Months in reverse 0 points 0 points 0 points 0 points 0 points  Repeat phrase 0 points 0 points 0 points 0 points 0 points  Total Score 0 points 0 points 0 points 0 points 0 points    Immunizations Immunization History  Administered Date(s) Administered   Fluad Quad(high Dose 65+) 08/22/2019,  09/10/2020, 09/10/2021, 09/16/2022   Influenza, High Dose Seasonal PF 08/25/2016, 08/07/2017, 08/09/2018   Influenza-Unspecified 08/31/2015   PFIZER(Purple Top)SARS-COV-2 Vaccination 01/01/2020, 01/25/2020, 09/25/2020   Pneumococcal Conjugate-13 01/18/2016   Pneumococcal Polysaccharide-23 02/02/2017   Td 01/18/2016    TDAP status: Up to date  Flu Vaccine status: Up to date  Pneumococcal vaccine status: Up to date  Covid-19 vaccine status: Completed vaccines  Qualifies for Shingles Vaccine? Yes   Zostavax completed No   Shingrix Completed?: No.    Education has been provided regarding the importance of this vaccine. Patient has been advised to call insurance company to determine out of pocket expense if they have not yet received this vaccine. Advised may also receive vaccine at local pharmacy or Health Dept. Verbalized acceptance and understanding.  Screening Tests Health Maintenance  Topic Date Due   Zoster Vaccines- Shingrix (1 of 2) Never done   COVID-19 Vaccine (4 - 2023-24 season) 07/18/2022   INFLUENZA VACCINE  06/18/2023   Medicare Annual Wellness (AWV)  03/01/2024   DTaP/Tdap/Td (2 - Tdap) 01/17/2026   COLONOSCOPY (Pts 45-37yrs Insurance coverage will need to be confirmed)  10/15/2026   Pneumonia Vaccine 63+ Years old  Completed   DEXA SCAN  Completed   Hepatitis C Screening  Completed   HPV VACCINES  Aged Out    Health Maintenance  Health Maintenance Due  Topic Date Due   Zoster Vaccines- Shingrix (1 of 2) Never done   COVID-19 Vaccine (4 - 2023-24 season) 07/18/2022    Colorectal cancer screening: Type of screening: Colonoscopy. Completed 10/15/16. Repeat every 10 years- aged out  Mammogram status: No longer required due to age.- has appointment in June 2024  Bone Density status: Completed 05/01/21. Results reflect: Bone density results: OSTEOPENIA. Repeat every 5 years.  Lung Cancer Screening: (Low Dose CT Chest recommended if Age 84-80 years, 30 pack-year  currently smoking OR have quit w/in 15years.) does not qualify.   Additional Screening:  Hepatitis C Screening: does qualify; Completed 02/02/17  Vision Screening: Recommended annual ophthalmology exams for early detection of glaucoma and other disorders of the eye. Is the patient up to date with their annual eye exam?  Yes  Who is the provider or what is the name of the office in which the patient attends annual eye exams? [facility]  If pt is not established with a provider, would they like to be referred to a provider to establish care? No .   Dental Screening: Recommended annual dental exams for proper oral hygiene  Community Resource Referral / Chronic Care Management: CRR required this visit?  No   CCM required this visit?  No      Plan:     I have personally reviewed and noted the following in the patient's chart:   Medical and social history Use of alcohol, tobacco or illicit drugs  Current medications and supplements including opioid prescriptions. Patient is not currently taking opioid prescriptions. Functional ability and status Nutritional status Physical activity Advanced directives List of other physicians Hospitalizations, surgeries, and ER visits in previous 12 months Vitals Screenings to include cognitive, depression, and falls Referrals and appointments  In addition, I have reviewed and discussed with patient certain preventive protocols, quality metrics, and best practice recommendations. A written personalized care plan for preventive services as well as general preventive health recommendations were provided to patient.     Hal Hope, LPN   02/23/8118   Nurse Notes: none

## 2023-03-02 NOTE — Patient Instructions (Signed)
Felicia Vargas , Thank you for taking time to come for your Medicare Wellness Visit. I appreciate your ongoing commitment to your health goals. Please review the following plan we discussed and let me know if I can assist you in the future.   These are the goals we discussed:  Goals      DIET - EAT MORE FRUITS AND VEGETABLES     DIET - INCREASE WATER INTAKE     Recommend continue drinking at least 6-8 glasses of water a day      Patient Stated     02/22/2021, no goals     Weight (lb) < 200 lb (90.7 kg)        This is a list of the screening recommended for you and due dates:  Health Maintenance  Topic Date Due   Zoster (Shingles) Vaccine (1 of 2) Never done   COVID-19 Vaccine (4 - 2023-24 season) 07/18/2022   Flu Shot  06/18/2023   Medicare Annual Wellness Visit  03/01/2024   DTaP/Tdap/Td vaccine (2 - Tdap) 01/17/2026   Colon Cancer Screening  10/15/2026   Pneumonia Vaccine  Completed   DEXA scan (bone density measurement)  Completed   Hepatitis C Screening: USPSTF Recommendation to screen - Ages 69-79 yo.  Completed   HPV Vaccine  Aged Out    Advanced directives: no  Conditions/risks identified: none  Next appointment: Follow up in one year for your annual wellness visit 03/07/24 @ 9:15 am by phone   Preventive Care 65 Years and Older, Female Preventive care refers to lifestyle choices and visits with your health care provider that can promote health and wellness. What does preventive care include? A yearly physical exam. This is also called an annual well check. Dental exams once or twice a year. Routine eye exams. Ask your health care provider how often you should have your eyes checked. Personal lifestyle choices, including: Daily care of your teeth and gums. Regular physical activity. Eating a healthy diet. Avoiding tobacco and drug use. Limiting alcohol use. Practicing safe sex. Taking low-dose aspirin every day. Taking vitamin and mineral supplements as  recommended by your health care provider. What happens during an annual well check? The services and screenings done by your health care provider during your annual well check will depend on your age, overall health, lifestyle risk factors, and family history of disease. Counseling  Your health care provider may ask you questions about your: Alcohol use. Tobacco use. Drug use. Emotional well-being. Home and relationship well-being. Sexual activity. Eating habits. History of falls. Memory and ability to understand (cognition). Work and work Astronomer. Reproductive health. Screening  You may have the following tests or measurements: Height, weight, and BMI. Blood pressure. Lipid and cholesterol levels. These may be checked every 5 years, or more frequently if you are over 72 years old. Skin check. Lung cancer screening. You may have this screening every year starting at age 75 if you have a 30-pack-year history of smoking and currently smoke or have quit within the past 15 years. Fecal occult blood test (FOBT) of the stool. You may have this test every year starting at age 75. Flexible sigmoidoscopy or colonoscopy. You may have a sigmoidoscopy every 5 years or a colonoscopy every 10 years starting at age 75. Hepatitis C blood test. Hepatitis B blood test. Sexually transmitted disease (STD) testing. Diabetes screening. This is done by checking your blood sugar (glucose) after you have not eaten for a while (fasting). You may have this  done every 1-3 years. Bone density scan. This is done to screen for osteoporosis. You may have this done starting at age 75. Mammogram. This may be done every 1-2 years. Talk to your health care provider about how often you should have regular mammograms. Talk with your health care provider about your test results, treatment options, and if necessary, the need for more tests. Vaccines  Your health care provider may recommend certain vaccines, such  as: Influenza vaccine. This is recommended every year. Tetanus, diphtheria, and acellular pertussis (Tdap, Td) vaccine. You may need a Td booster every 10 years. Zoster vaccine. You may need this after age 75. Pneumococcal 13-valent conjugate (PCV13) vaccine. One dose is recommended after age 75. Pneumococcal polysaccharide (PPSV23) vaccine. One dose is recommended after age 75. Talk to your health care provider about which screenings and vaccines you need and how often you need them. This information is not intended to replace advice given to you by your health care provider. Make sure you discuss any questions you have with your health care provider. Document Released: 11/30/2015 Document Revised: 07/23/2016 Document Reviewed: 09/04/2015 Elsevier Interactive Patient Education  2017 [place] Prevention in the Home Falls can cause injuries. They can happen to people of all ages. There are many things you can do to make your home safe and to help prevent falls. What can I do on the outside of my home? Regularly fix the edges of walkways and driveways and fix any cracks. Remove anything that might make you trip as you walk through a door, such as a raised step or threshold. Trim any bushes or trees on the path to your home. Use bright outdoor lighting. Clear any walking paths of anything that might make someone trip, such as rocks or tools. Regularly check to see if handrails are loose or broken. Make sure that both sides of any steps have handrails. Any raised decks and porches should have guardrails on the edges. Have any leaves, snow, or ice cleared regularly. Use sand or salt on walking paths during winter. Clean up any spills in your garage right away. This includes oil or grease spills. What can I do in the bathroom? Use night lights. Install grab bars by the toilet and in the tub and shower. Do not use towel bars as grab bars. Use non-skid mats or decals in the tub or  shower. If you need to sit down in the shower, use a plastic, non-slip stool. Keep the floor dry. Clean up any water that spills on the floor as soon as it happens. Remove soap buildup in the tub or shower regularly. Attach bath mats securely with double-sided non-slip rug tape. Do not have throw rugs and other things on the floor that can make you trip. What can I do in the bedroom? Use night lights. Make sure that you have a light by your bed that is easy to reach. Do not use any sheets or blankets that are too big for your bed. They should not hang down onto the floor. Have a firm chair that has side arms. You can use this for support while you get dressed. Do not have throw rugs and other things on the floor that can make you trip. What can I do in the kitchen? Clean up any spills right away. Avoid walking on wet floors. Keep items that you use a lot in easy-to-reach places. If you need to reach something above you, use a strong step stool that  has a grab bar. Keep electrical cords out of the way. Do not use floor polish or wax that makes floors slippery. If you must use wax, use non-skid floor wax. Do not have throw rugs and other things on the floor that can make you trip. What can I do with my stairs? Do not leave any items on the stairs. Make sure that there are handrails on both sides of the stairs and use them. Fix handrails that are broken or loose. Make sure that handrails are as long as the stairways. Check any carpeting to make sure that it is firmly attached to the stairs. Fix any carpet that is loose or worn. Avoid having throw rugs at the top or bottom of the stairs. If you do have throw rugs, attach them to the floor with carpet tape. Make sure that you have a light switch at the top of the stairs and the bottom of the stairs. If you do not have them, ask someone to add them for you. What else can I do to help prevent falls? Wear shoes that: Do not have high heels. Have  rubber bottoms. Are comfortable and fit you well. Are closed at the toe. Do not wear sandals. If you use a stepladder: Make sure that it is fully opened. Do not climb a closed stepladder. Make sure that both sides of the stepladder are locked into place. Ask someone to hold it for you, if possible. Clearly mark and make sure that you can see: Any grab bars or handrails. First and last steps. Where the edge of each step is. Use tools that help you move around (mobility aids) if they are needed. These include: Canes. Walkers. Scooters. Crutches. Turn on the lights when you go into a dark area. Replace any light bulbs as soon as they burn out. Set up your furniture so you have a clear path. Avoid moving your furniture around. If any of your floors are uneven, fix them. If there are any pets around you, be aware of where they are. Review your medicines with your doctor. Some medicines can make you feel dizzy. This can increase your chance of falling. Ask your doctor what other things that you can do to help prevent falls. This information is not intended to replace advice given to you by your health care provider. Make sure you discuss any questions you have with your health care provider. Document Released: 08/30/2009 Document Revised: 04/10/2016 Document Reviewed: 12/08/2014 Elsevier Interactive Patient Education  2017 [employer].

## 2023-03-04 DIAGNOSIS — M25661 Stiffness of right knee, not elsewhere classified: Secondary | ICD-10-CM | POA: Diagnosis not present

## 2023-03-13 ENCOUNTER — Other Ambulatory Visit: Payer: Self-pay | Admitting: Family Medicine

## 2023-03-13 NOTE — Telephone Encounter (Signed)
Requested Prescriptions  Pending Prescriptions Disp Refills   lisinopril-hydrochlorothiazide (ZESTORETIC) 20-25 MG tablet [Pharmacy Med Name: LISINOPRIL-HCTZ 20-25 MG TAB] 90 tablet 0    Sig: TAKE 1 TABLET BY MOUTH EVERY DAY     Cardiovascular:  ACEI + Diuretic Combos Passed - 03/13/2023  1:58 AM      Passed - Na in normal range and within 180 days    Sodium  Date Value Ref Range Status  09/16/2022 138 134 - 144 mmol/L Final         Passed - K in normal range and within 180 days    Potassium  Date Value Ref Range Status  09/16/2022 3.9 3.5 - 5.2 mmol/L Final         Passed - Cr in normal range and within 180 days    Creatinine, Ser  Date Value Ref Range Status  09/16/2022 0.90 0.57 - 1.00 mg/dL Final         Passed - eGFR is 30 or above and within 180 days    GFR calc Af Amer  Date Value Ref Range Status  09/10/2020 75 >59 mL/min/1.73 Final    Comment:    **In accordance with recommendations from the NKF-ASN Task force,**   [facility] is in the process of updating its eGFR calculation to the   2021 CKD-EPI creatinine equation that estimates kidney function   without a race variable.    GFR calc non Af Amer  Date Value Ref Range Status  09/10/2020 65 >59 mL/min/1.73 Final   eGFR  Date Value Ref Range Status  09/16/2022 67 >59 mL/min/1.73 Final         Passed - Patient is not pregnant      Passed - Last BP in normal range    BP Readings from Last 1 Encounters:  09/16/22 115/67         Passed - Valid encounter within last 6 months    Recent Outpatient Visits           5 months ago Routine general medical examination at a health care facility   [facility], [place] P, DO   7 months ago Cellulitis of left upper extremity   [facility] [place], [place] P, DO   7 months ago Cellulitis of left upper extremity   [place] [facility] [place], [place] P, DO   1 year ago Upper respiratory tract  infection, unspecified type   [facility] [place], Megan P, DO   1 year ago Primary hypertension   [facility] [place], Oralia Rud, DO       Future Appointments             In 4 days Dorcas Carrow, DO [facility], [facility]

## 2023-03-17 ENCOUNTER — Encounter: Payer: Self-pay | Admitting: Family Medicine

## 2023-03-17 ENCOUNTER — Ambulatory Visit (INDEPENDENT_AMBULATORY_CARE_PROVIDER_SITE_OTHER): Payer: Medicare HMO | Admitting: Family Medicine

## 2023-03-17 VITALS — BP 121/67 | HR 67 | Temp 98.3°F | Wt 233.3 lb

## 2023-03-17 DIAGNOSIS — J301 Allergic rhinitis due to pollen: Secondary | ICD-10-CM

## 2023-03-17 DIAGNOSIS — E7800 Pure hypercholesterolemia, unspecified: Secondary | ICD-10-CM | POA: Diagnosis not present

## 2023-03-17 DIAGNOSIS — I10 Essential (primary) hypertension: Secondary | ICD-10-CM | POA: Diagnosis not present

## 2023-03-17 DIAGNOSIS — D692 Other nonthrombocytopenic purpura: Secondary | ICD-10-CM

## 2023-03-17 MED ORDER — BENZONATATE 200 MG PO CAPS: 200.0000 mg | ORAL_CAPSULE | Freq: Two times a day (BID) | ORAL | 0 refills | Status: DC | PRN

## 2023-03-17 MED ORDER — PREDNISONE 50 MG PO TABS: 50.0000 mg | ORAL_TABLET | Freq: Every day | ORAL | 0 refills | Status: DC

## 2023-03-17 MED ORDER — LISINOPRIL-HYDROCHLOROTHIAZIDE 20-25 MG PO TABS: 1.0000 | ORAL_TABLET | Freq: Every day | ORAL | 1 refills | Status: DC

## 2023-03-17 MED ORDER — PRAVASTATIN SODIUM 20 MG PO TABS: ORAL_TABLET | ORAL | 1 refills | Status: DC

## 2023-03-17 NOTE — Assessment & Plan Note (Signed)
Under good control on current regimen. Continue current regimen. Continue to monitor. Call with any concerns. Refills given. Labs drawn today.   

## 2023-03-17 NOTE — Assessment & Plan Note (Signed)
Reassured patient. Continue to monitor.  

## 2023-03-17 NOTE — Assessment & Plan Note (Signed)
Will treat with short burst of prednisone. Call with any concerns or if not getting better. Continue to monitor.

## 2023-03-17 NOTE — Progress Notes (Signed)
BP 121/67   Pulse 67   Temp 98.3 F (36.8 C) (Oral)   Wt 233 lb 4.8 oz (105.8 kg)   SpO2 99%   BMI 40.05 kg/m    Subjective:    Patient ID: Felicia Vargas, female    DOB: 02/04/1946, 77 y.o.   MRN: [id number]  HPI: Felicia Vargas is a 77 y.o. female  Chief Complaint  Patient presents with   Hypertension   Cough    Patient says she has been having a cough for the past couple of days since Saturday evening and coughing up clear mucus, but denies having any discoloration. Patient says she has been taking over the counter Coricidin.    Ear Pain   UPPER RESPIRATORY TRACT INFECTION Duration: 3 days Worst symptom: cough Fever: no Cough: yes Shortness of breath: no Wheezing: no Chest pain: no Chest tightness: no Chest congestion: no Nasal congestion: yes Runny nose: yes Post nasal drip: yes Sneezing: no Sore throat: no Swollen glands: no Sinus pressure: no Headache: no Face pain: no Toothache: no Ear pain: yes "right Ear pressure: yes bilateral Eyes red/itching:no Eye drainage/crusting: no  Vomiting: no Rash: no Fatigue: yes Sick contacts: no Strep contacts: no  Context: stable Recurrent sinusitis: no Relief with OTC cold/cough medications: no  Treatments attempted: coricedin   HYPERTENSION / HYPERLIPIDEMIA Satisfied with current treatment? yes Duration of hypertension: chronic BP monitoring frequency: not checking BP medication side effects: no Past BP meds: lisinopril-HCTZ Duration of hyperlipidemia: chronic Cholesterol medication side effects: no Cholesterol supplements: fish oil Past cholesterol medications: pravastatin Medication compliance: excellent compliance Aspirin: yes Recent stressors: no Recurrent headaches: no Visual changes: no Palpitations: no Dyspnea: no Chest pain: no Lower extremity edema: no Dizzy/lightheaded: no  Relevant past medical, surgical, family and social history reviewed and updated as indicated. Interim medical  history since our last visit reviewed. Allergies and medications reviewed and updated.  Review of Systems  Constitutional: Negative.   HENT:  Positive for congestion, postnasal drip, rhinorrhea and sinus pressure. Negative for dental problem, drooling, ear discharge, ear pain, facial swelling, hearing loss, mouth sores, nosebleeds, sinus pain, sneezing, sore throat, tinnitus, trouble swallowing and voice change.   Respiratory:  Positive for cough. Negative for apnea, choking, chest tightness, shortness of breath, wheezing and stridor.   Cardiovascular: Negative.   Gastrointestinal: Negative.   Musculoskeletal: Negative.   Psychiatric/Behavioral: Negative.      Per HPI unless specifically indicated above     Objective:    BP 121/67   Pulse 67   Temp 98.3 F (36.8 C) (Oral)   Wt 233 lb 4.8 oz (105.8 kg)   SpO2 99%   BMI 40.05 kg/m   Wt Readings from Last 3 Encounters:  03/17/23 233 lb 4.8 oz (105.8 kg)  03/02/23 238 lb (108 kg)  09/16/22 238 lb 9.6 oz (108.2 kg)    Physical Exam Vitals and nursing note reviewed.  Constitutional:      General: She is not in acute distress.    Appearance: Normal appearance. She is obese. She is not ill-appearing, toxic-appearing or diaphoretic.  HENT:     Head: Normocephalic and atraumatic.     Right Ear: Tympanic membrane, ear canal and external ear normal.     Left Ear: Tympanic membrane, ear canal and external ear normal.     Nose: Congestion and rhinorrhea present.     Mouth/Throat:     Mouth: Mucous membranes are moist.     Pharynx: Oropharynx is clear.  No oropharyngeal exudate or posterior oropharyngeal erythema.  Eyes:     General: No scleral icterus.       Right eye: No discharge.        Left eye: No discharge.     Extraocular Movements: Extraocular movements intact.     Conjunctiva/sclera: Conjunctivae normal.     Pupils: Pupils are equal, round, and reactive to light.  Cardiovascular:     Rate and Rhythm: Normal rate and  regular rhythm.     Pulses: Normal pulses.     Heart sounds: Normal heart sounds. No murmur heard.    No friction rub. No gallop.  Pulmonary:     Effort: Pulmonary effort is normal. No respiratory distress.     Breath sounds: Normal breath sounds. No stridor. No wheezing, rhonchi or rales.  Chest:     Chest wall: No tenderness.  Musculoskeletal:        General: Normal range of motion.     Cervical back: Normal range of motion and neck supple.  Skin:    General: Skin is warm and dry.     Capillary Refill: Capillary refill takes less than 2 seconds.     Coloration: Skin is not jaundiced or pale.     Findings: No bruising, erythema, lesion or rash.  Neurological:     General: No focal deficit present.     Mental Status: She is alert and oriented to person, place, and time. Mental status is at baseline.  Psychiatric:        Mood and Affect: Mood normal.        Behavior: Behavior normal.        Thought Content: Thought content normal.        Judgment: Judgment normal.     Results for orders placed or performed in visit on 09/16/22  CBC with Differential/Platelet  Result Value Ref Range   WBC 6.0 3.4 - 10.8 x10E3/uL   RBC 3.25 (L) 3.77 - 5.28 x10E6/uL   Hemoglobin 10.6 (L) 11.1 - 15.9 g/dL   Hematocrit 16.1 (L) 09.6 - 46.6 %   MCV 99 (H) 79 - 97 fL   MCH 32.6 26.6 - 33.0 pg   MCHC 33.1 31.5 - 35.7 g/dL   RDW 04.5 40.9 - 81.1 %   Platelets 305 150 - 450 x10E3/uL   Neutrophils 65 Not Estab. %   Lymphs 23 Not Estab. %   Monocytes 8 Not Estab. %   Eos 2 Not Estab. %   Basos 2 Not Estab. %   Neutrophils Absolute 3.9 1.4 - 7.0 x10E3/uL   Lymphocytes Absolute 1.4 0.7 - 3.1 x10E3/uL   Monocytes Absolute 0.5 0.1 - 0.9 x10E3/uL   EOS (ABSOLUTE) 0.1 0.0 - 0.4 x10E3/uL   Basophils Absolute 0.1 0.0 - 0.2 x10E3/uL   Immature Granulocytes 0 Not Estab. %   Immature Grans (Abs) 0.0 0.0 - 0.1 x10E3/uL  Comprehensive metabolic panel  Result Value Ref Range   Glucose 102 (H) 70 - 99 mg/dL    BUN 21 8 - 27 mg/dL   Creatinine, Ser 9.14 0.57 - 1.00 mg/dL   eGFR 67 >78 GN/FAO/1.30   BUN/Creatinine Ratio 23 12 - 28   Sodium 138 134 - 144 mmol/L   Potassium 3.9 3.5 - 5.2 mmol/L   Chloride 103 96 - 106 mmol/L   CO2 21 20 - 29 mmol/L   Calcium 10.0 8.7 - 10.3 mg/dL   Total Protein 6.3 6.0 - 8.5 g/dL   Albumin 3.9  3.8 - 4.8 g/dL   Globulin, Total 2.4 1.5 - 4.5 g/dL   Albumin/Globulin Ratio 1.6 1.2 - 2.2   Bilirubin Total 0.7 0.0 - 1.2 mg/dL   Alkaline Phosphatase 103 44 - 121 IU/L   AST 26 0 - 40 IU/L   ALT 25 0 - 32 IU/L  Lipid Panel w/o Chol/HDL Ratio  Result Value Ref Range   Cholesterol, Total 135 100 - 199 mg/dL   Triglycerides 89 0 - 149 mg/dL   HDL 47 >09 mg/dL   VLDL Cholesterol Cal 17 5 - 40 mg/dL   LDL Chol Calc (NIH) 71 0 - 99 mg/dL  Urinalysis, Routine w reflex microscopic  Result Value Ref Range   Specific Gravity, UA 1.015 1.005 - 1.030   pH, UA 7.0 5.0 - 7.5   Color, UA Yellow Yellow   Appearance Ur Clear Clear   Leukocytes,UA Negative Negative   Protein,UA Negative Negative/Trace   Glucose, UA Negative Negative   Ketones, UA Negative Negative   RBC, UA Negative Negative   Bilirubin, UA Negative Negative   Urobilinogen, Ur 1.0 0.2 - 1.0 mg/dL   Nitrite, UA Negative Negative   Microscopic Examination Comment   TSH  Result Value Ref Range   TSH 1.390 0.450 - 4.500 uIU/mL  Microalbumin, Urine Waived  Result Value Ref Range   Microalb, Ur Waived 10 0 - 19 mg/L   Creatinine, Urine Waived 50 10 - 300 mg/dL   Microalb/Creat Ratio <30 <30 mg/g      Assessment & Plan:   Problem List Items Addressed This Visit       Cardiovascular and Mediastinum   Hypertension    Under good control on current regimen. Continue current regimen. Continue to monitor. Call with any concerns. Refills given. Labs drawn today.        Relevant Medications   lisinopril-hydrochlorothiazide (ZESTORETIC) 20-25 MG tablet   pravastatin (PRAVACHOL) 20 MG tablet   Other  Relevant Orders   Comprehensive metabolic panel   Senile purpura (HCC)    Reassured patient. Continue to monitor.       Relevant Medications   lisinopril-hydrochlorothiazide (ZESTORETIC) 20-25 MG tablet   pravastatin (PRAVACHOL) 20 MG tablet     Respiratory   Rhinitis, allergic    Will treat with short burst of prednisone. Call with any concerns or if not getting better. Continue to monitor.         Other   High cholesterol - Primary    Under good control on current regimen. Continue current regimen. Continue to monitor. Call with any concerns. Refills given. Labs drawn today.       Relevant Medications   lisinopril-hydrochlorothiazide (ZESTORETIC) 20-25 MG tablet   pravastatin (PRAVACHOL) 20 MG tablet   Other Relevant Orders   Lipid Panel w/o Chol/HDL Ratio   Comprehensive metabolic panel     Follow up plan: Return in about 6 months (around 09/16/2023) for physical.

## 2023-03-18 LAB — COMPREHENSIVE METABOLIC PANEL
ALT: 18 IU/L (ref 0–32)
AST: 23 IU/L (ref 0–40)
Albumin/Globulin Ratio: 1.6 (ref 1.2–2.2)
Albumin: 3.9 g/dL (ref 3.8–4.8)
Alkaline Phosphatase: 108 IU/L (ref 44–121)
BUN/Creatinine Ratio: 30 — ABNORMAL HIGH (ref 12–28)
BUN: 28 mg/dL — ABNORMAL HIGH (ref 8–27)
Bilirubin Total: 0.7 mg/dL (ref 0.0–1.2)
CO2: 20 mmol/L (ref 20–29)
Calcium: 9.8 mg/dL (ref 8.7–10.3)
Chloride: 104 mmol/L (ref 96–106)
Creatinine, Ser: 0.94 mg/dL (ref 0.57–1.00)
Globulin, Total: 2.5 g/dL (ref 1.5–4.5)
Glucose: 91 mg/dL (ref 70–99)
Potassium: 3.7 mmol/L (ref 3.5–5.2)
Sodium: 139 mmol/L (ref 134–144)
Total Protein: 6.4 g/dL (ref 6.0–8.5)
eGFR: 63 mL/min/{1.73_m2} (ref >59)

## 2023-03-18 LAB — LIPID PANEL W/O CHOL/HDL RATIO
Cholesterol, Total: 151 mg/dL (ref 100–199)
HDL: 59 mg/dL (ref >39)
LDL Chol Calc (NIH): 80 mg/dL (ref 0–99)
Triglycerides: 61 mg/dL (ref 0–149)
VLDL Cholesterol Cal: 12 mg/dL (ref 5–40)

## 2023-03-19 ENCOUNTER — Other Ambulatory Visit: Payer: Self-pay | Admitting: Family Medicine

## 2023-03-19 NOTE — Telephone Encounter (Signed)
Medication Refill - Medication: cyclobenzaprine (FLEXERIL) 5 MG tablet and oxybutynin (DITROPAN) 5 MG tablet   Has the patient contacted their pharmacy? Yes.   Pharmacy did not have either script for refills  Preferred Pharmacy (with phone number or street name):  [id number], [place] - [address] Phone: [phone]  Fax: [phone]     Has the patient been seen for an appointment in the last year OR does the patient have an upcoming appointment? Yes.    Agent: Please be advised that RX refills may take up to 3 business days. We ask that you follow-up with your pharmacy.  Patient has no more Flexeril but will run out of Ditropan by Sunday.

## 2023-03-19 NOTE — Telephone Encounter (Signed)
Requested medication (s) are due for refill today: yes  Requested medication (s) are on the active medication list: yes  Last refill:  02/11/22  Future visit scheduled: yes  Notes to clinic:  Unable to refill per protocol, cannot delegate.      Requested Prescriptions  Pending Prescriptions Disp Refills   cyclobenzaprine (FLEXERIL) 5 MG tablet 180 tablet 0    Sig: Take 1-2 tablets (5-10 mg total) by mouth at bedtime.     Not Delegated - Analgesics:  Muscle Relaxants Failed - 03/19/2023 10:46 AM      Failed - This refill cannot be delegated      Passed - Valid encounter within last 6 months    Recent Outpatient Visits           2 days ago High cholesterol   [facility] [place], Megan P, DO   6 months ago Routine general medical examination at a health care facility   [facility], [place] P, DO   8 months ago Cellulitis of left upper extremity   [facility] [place], Megan P, DO   8 months ago Cellulitis of left upper extremity   [facility] [place], [place] P, DO   1 year ago Upper respiratory tract infection, unspecified type   [facility] [place], [place], DO       Future Appointments             In 6 months Johnson, Oralia Rud, DO [place] [facility], [facility]             oxybutynin (DITROPAN) 5 MG tablet 180 tablet 3    Sig: TAKE 1 TABLET (5 MG) BY ORAL ROUTE 2 TIMES PER DAY     Urology:  Bladder Agents Passed - 03/19/2023 10:46 AM      Passed - Valid encounter within last 12 months    Recent Outpatient Visits           2 days ago High cholesterol   [place] [facility] [place], Megan P, DO   6 months ago Routine general medical examination at a health care facility   [facility], [place] P, DO   8 months ago Cellulitis of left upper extremity   [place] [place], Megan P, DO   8 months ago Cellulitis of left upper extremity   [place] [facility] [place], Megan P, DO   1 year ago Upper respiratory tract infection, unspecified type   [facility] Dorcas Carrow, DO       Future Appointments             In 6 months Laural Benes, Oralia Rud, DO [place] [facility], [facility]

## 2023-03-20 ENCOUNTER — Other Ambulatory Visit: Payer: Self-pay | Admitting: Family Medicine

## 2023-03-20 NOTE — Telephone Encounter (Signed)
Requested medication (s) are due for refill today: yes  Requested medication (s) are on the active medication list: yes  Last refill:  02/11/22 #180/0  Future visit scheduled: yes  Notes to clinic:  Unable to refill per protocol, cannot delegate.    Requested Prescriptions  Pending Prescriptions Disp Refills   cyclobenzaprine (FLEXERIL) 5 MG tablet [Pharmacy Med Name: CYCLOBENZAPRINE 5 MG TABLET] 180 tablet 0    Sig: Take 1-2 tablets (5-10 mg total) by mouth at bedtime.     Not Delegated - Analgesics:  Muscle Relaxants Failed - 03/20/2023  2:18 PM      Failed - This refill cannot be delegated      Passed - Valid encounter within last 6 months    Recent Outpatient Visits           3 days ago High cholesterol   [place] [facility] [place], Megan P, DO   6 months ago Routine general medical examination at a health care facility   [facility], [place] P, DO   8 months ago Cellulitis of left upper extremity   [place] [facility] [place], Megan P, DO   8 months ago Cellulitis of left upper extremity   [facility] [place], Megan P, DO   1 year ago Upper respiratory tract infection, unspecified type   [place] [facility] Dorcas Carrow, DO       Future Appointments             In 6 months Laural Benes, Oralia Rud, DO [place] [facility], [facility]

## 2023-03-24 ENCOUNTER — Other Ambulatory Visit: Payer: Self-pay | Admitting: Family Medicine

## 2023-03-24 NOTE — Telephone Encounter (Signed)
Medication Refill - Medication: Cyclobenzaprine HCl 10 mg Denied says refill too soon, pt is all out of this med  Has the patient contacted their pharmacy? yes (Agent: If no, request that the patient contact the pharmacy for the refill. If patient does not wish to contact the pharmacy document the reason why and proceed with request.) (Agent: If yes, when and what did the pharmacy advise?)contact pcp  Preferred Pharmacy (with phone number or street name): [phone] [place] [facility], [place] - [address]  Phone: [phone] Fax: [phone]  Has the patient been seen for an appointment in the last year OR does the patient have an upcoming appointment? yes  Agent: Please be advised that RX refills may take up to 3 business days. We ask that you follow-up with your pharmacy.

## 2023-03-24 NOTE — Telephone Encounter (Signed)
Requested medication (s) are due for refill today: Yes  Requested medication (s) are on the active medication list: Yes  Last refill:  02/11/22  Future visit scheduled: Yes  Notes to clinic:  Unable to refill per protocol, cannot delegate. This is the 2nd patient called request, he is out of this medication, last refilled above 01/22/22, refused in encounter dated 03/20/23 saying too early to refill, however this medication is due for a refill. Routing to provider.     Requested Prescriptions  Pending Prescriptions Disp Refills   cyclobenzaprine (FLEXERIL) 5 MG tablet 180 tablet 0    Sig: Take 1-2 tablets (5-10 mg total) by mouth at bedtime.     Not Delegated - Analgesics:  Muscle Relaxants Failed - 03/24/2023 10:39 AM      Failed - This refill cannot be delegated      Passed - Valid encounter within last 6 months    Recent Outpatient Visits           1 week ago High cholesterol   [facility] [place], Megan P, DO   6 months ago Routine general medical examination at a health care facility   [facility], [place] P, DO   8 months ago Cellulitis of left upper extremity   [facility] [place], Megan P, DO   8 months ago Cellulitis of left upper extremity   [facility] [place], Megan P, DO   1 year ago Upper respiratory tract infection, unspecified type   [facility] Dorcas Carrow, DO       Future Appointments             In 5 months Laural Benes, Oralia Rud, DO [place] [facility], [facility]

## 2023-03-24 NOTE — Telephone Encounter (Signed)
Requested medication (s) are due for refill today: yes  Requested medication (s) are on the active medication list: yes  Last refill:  02/11/22 #180  Future visit scheduled: yes  Notes to clinic:  med not delegated to NT to RF   Requested Prescriptions  Pending Prescriptions Disp Refills   cyclobenzaprine (FLEXERIL) 5 MG tablet [Pharmacy Med Name: CYCLOBENZAPRINE 5 MG TABLET] 180 tablet 0    Sig: Take 1-2 tablets (5-10 mg total) by mouth at bedtime.     Not Delegated - Analgesics:  Muscle Relaxants Failed - 03/24/2023 12:58 PM      Failed - This refill cannot be delegated      Passed - Valid encounter within last 6 months    Recent Outpatient Visits           1 week ago High cholesterol   [place] [facility] [place], Megan P, DO   6 months ago Routine general medical examination at a health care facility   [facility], [place] P, DO   8 months ago Cellulitis of left upper extremity   [place] [facility] [place], Megan P, DO   8 months ago Cellulitis of left upper extremity   [place] [facility] [place], Megan P, DO   1 year ago Upper respiratory tract infection, unspecified type   [place] [facility] Dorcas Carrow, DO       Future Appointments             In 5 months Laural Benes, Oralia Rud, DO [place] [facility], [facility]

## 2023-03-25 MED ORDER — CYCLOBENZAPRINE HCL 5 MG PO TABS: 5.0000 mg | ORAL_TABLET | Freq: Every day | ORAL | 0 refills | Status: DC

## 2023-03-27 DIAGNOSIS — M1712 Unilateral primary osteoarthritis, left knee: Secondary | ICD-10-CM | POA: Diagnosis not present

## 2023-03-27 DIAGNOSIS — Z96651 Presence of right artificial knee joint: Secondary | ICD-10-CM | POA: Diagnosis not present

## 2023-04-07 ENCOUNTER — Other Ambulatory Visit: Payer: Self-pay | Admitting: Family Medicine

## 2023-04-07 DIAGNOSIS — Z1231 Encounter for screening mammogram for malignant neoplasm of breast: Secondary | ICD-10-CM

## 2023-04-20 ENCOUNTER — Encounter: Payer: Self-pay | Admitting: Family Medicine

## 2023-05-08 DIAGNOSIS — Z01812 Encounter for preprocedural laboratory examination: Secondary | ICD-10-CM | POA: Diagnosis not present

## 2023-05-08 DIAGNOSIS — M1712 Unilateral primary osteoarthritis, left knee: Secondary | ICD-10-CM | POA: Diagnosis not present

## 2023-05-11 ENCOUNTER — Ambulatory Visit
Admission: RE | Admit: 2023-05-11 | Discharge: 2023-05-11 | Disposition: A | Discharge status: Home / Self Care | Payer: Medicare HMO | Source: Ambulatory Visit | Attending: Family Medicine | Admitting: Family Medicine

## 2023-05-11 DIAGNOSIS — Z1231 Encounter for screening mammogram for malignant neoplasm of breast: Secondary | ICD-10-CM | POA: Diagnosis not present

## 2023-05-18 HISTORY — PX: REPLACEMENT TOTAL KNEE: SUR1224

## 2023-06-04 DIAGNOSIS — Z85828 Personal history of other malignant neoplasm of skin: Secondary | ICD-10-CM | POA: Diagnosis not present

## 2023-06-04 DIAGNOSIS — E669 Obesity, unspecified: Secondary | ICD-10-CM | POA: Diagnosis not present

## 2023-06-04 DIAGNOSIS — Z6841 Body Mass Index (BMI) 40.0 and over, adult: Secondary | ICD-10-CM | POA: Diagnosis not present

## 2023-06-04 DIAGNOSIS — Z96652 Presence of left artificial knee joint: Secondary | ICD-10-CM | POA: Diagnosis not present

## 2023-06-04 DIAGNOSIS — N3281 Overactive bladder: Secondary | ICD-10-CM | POA: Diagnosis not present

## 2023-06-04 DIAGNOSIS — M8580 Other specified disorders of bone density and structure, unspecified site: Secondary | ICD-10-CM | POA: Diagnosis not present

## 2023-06-04 DIAGNOSIS — J302 Other seasonal allergic rhinitis: Secondary | ICD-10-CM | POA: Diagnosis not present

## 2023-06-04 DIAGNOSIS — M21162 Varus deformity, not elsewhere classified, left knee: Secondary | ICD-10-CM | POA: Diagnosis not present

## 2023-06-04 DIAGNOSIS — E785 Hyperlipidemia, unspecified: Secondary | ICD-10-CM | POA: Diagnosis not present

## 2023-06-04 DIAGNOSIS — G8918 Other acute postprocedural pain: Secondary | ICD-10-CM | POA: Diagnosis not present

## 2023-06-04 DIAGNOSIS — Z4789 Encounter for other orthopedic aftercare: Secondary | ICD-10-CM | POA: Diagnosis not present

## 2023-06-04 DIAGNOSIS — J309 Allergic rhinitis, unspecified: Secondary | ICD-10-CM | POA: Diagnosis not present

## 2023-06-04 DIAGNOSIS — M1712 Unilateral primary osteoarthritis, left knee: Secondary | ICD-10-CM | POA: Diagnosis not present

## 2023-06-04 DIAGNOSIS — I10 Essential (primary) hypertension: Secondary | ICD-10-CM | POA: Diagnosis not present

## 2023-06-04 DIAGNOSIS — Z96651 Presence of right artificial knee joint: Secondary | ICD-10-CM | POA: Diagnosis not present

## 2023-06-05 DIAGNOSIS — Z85828 Personal history of other malignant neoplasm of skin: Secondary | ICD-10-CM | POA: Diagnosis not present

## 2023-06-05 DIAGNOSIS — J309 Allergic rhinitis, unspecified: Secondary | ICD-10-CM | POA: Diagnosis not present

## 2023-06-05 DIAGNOSIS — E669 Obesity, unspecified: Secondary | ICD-10-CM | POA: Diagnosis not present

## 2023-06-05 DIAGNOSIS — Z96651 Presence of right artificial knee joint: Secondary | ICD-10-CM | POA: Diagnosis not present

## 2023-06-05 DIAGNOSIS — M8580 Other specified disorders of bone density and structure, unspecified site: Secondary | ICD-10-CM | POA: Diagnosis not present

## 2023-06-05 DIAGNOSIS — I10 Essential (primary) hypertension: Secondary | ICD-10-CM | POA: Diagnosis not present

## 2023-06-05 DIAGNOSIS — M21162 Varus deformity, not elsewhere classified, left knee: Secondary | ICD-10-CM | POA: Diagnosis not present

## 2023-06-05 DIAGNOSIS — N3281 Overactive bladder: Secondary | ICD-10-CM | POA: Diagnosis not present

## 2023-06-05 DIAGNOSIS — M1712 Unilateral primary osteoarthritis, left knee: Secondary | ICD-10-CM | POA: Diagnosis not present

## 2023-06-05 DIAGNOSIS — Z6841 Body Mass Index (BMI) 40.0 and over, adult: Secondary | ICD-10-CM | POA: Diagnosis not present

## 2023-06-05 DIAGNOSIS — E785 Hyperlipidemia, unspecified: Secondary | ICD-10-CM | POA: Diagnosis not present

## 2023-06-11 DIAGNOSIS — M25662 Stiffness of left knee, not elsewhere classified: Secondary | ICD-10-CM | POA: Diagnosis not present

## 2023-06-11 DIAGNOSIS — M25562 Pain in left knee: Secondary | ICD-10-CM | POA: Diagnosis not present

## 2023-06-16 DIAGNOSIS — M25662 Stiffness of left knee, not elsewhere classified: Secondary | ICD-10-CM | POA: Diagnosis not present

## 2023-06-16 DIAGNOSIS — M25562 Pain in left knee: Secondary | ICD-10-CM | POA: Diagnosis not present

## 2023-06-16 DIAGNOSIS — Z96652 Presence of left artificial knee joint: Secondary | ICD-10-CM | POA: Diagnosis not present

## 2023-06-18 DIAGNOSIS — M25662 Stiffness of left knee, not elsewhere classified: Secondary | ICD-10-CM | POA: Diagnosis not present

## 2023-06-18 DIAGNOSIS — M25562 Pain in left knee: Secondary | ICD-10-CM | POA: Diagnosis not present

## 2023-06-22 DIAGNOSIS — M25562 Pain in left knee: Secondary | ICD-10-CM | POA: Diagnosis not present

## 2023-06-22 DIAGNOSIS — M25662 Stiffness of left knee, not elsewhere classified: Secondary | ICD-10-CM | POA: Diagnosis not present

## 2023-06-24 DIAGNOSIS — M25662 Stiffness of left knee, not elsewhere classified: Secondary | ICD-10-CM | POA: Diagnosis not present

## 2023-06-24 DIAGNOSIS — M25562 Pain in left knee: Secondary | ICD-10-CM | POA: Diagnosis not present

## 2023-06-29 DIAGNOSIS — M25562 Pain in left knee: Secondary | ICD-10-CM | POA: Diagnosis not present

## 2023-06-29 DIAGNOSIS — M25662 Stiffness of left knee, not elsewhere classified: Secondary | ICD-10-CM | POA: Diagnosis not present

## 2023-07-01 DIAGNOSIS — M25562 Pain in left knee: Secondary | ICD-10-CM | POA: Diagnosis not present

## 2023-07-01 DIAGNOSIS — M25662 Stiffness of left knee, not elsewhere classified: Secondary | ICD-10-CM | POA: Diagnosis not present

## 2023-07-04 DIAGNOSIS — I2510 Atherosclerotic heart disease of native coronary artery without angina pectoris: Secondary | ICD-10-CM | POA: Diagnosis not present

## 2023-07-04 DIAGNOSIS — K5900 Constipation, unspecified: Secondary | ICD-10-CM | POA: Diagnosis not present

## 2023-07-04 DIAGNOSIS — I10 Essential (primary) hypertension: Secondary | ICD-10-CM | POA: Diagnosis not present

## 2023-07-04 DIAGNOSIS — M8580 Other specified disorders of bone density and structure, unspecified site: Secondary | ICD-10-CM | POA: Diagnosis not present

## 2023-07-04 DIAGNOSIS — F325 Major depressive disorder, single episode, in full remission: Secondary | ICD-10-CM | POA: Diagnosis not present

## 2023-07-04 DIAGNOSIS — E785 Hyperlipidemia, unspecified: Secondary | ICD-10-CM | POA: Diagnosis not present

## 2023-07-04 DIAGNOSIS — Z9181 History of falling: Secondary | ICD-10-CM | POA: Diagnosis not present

## 2023-07-04 DIAGNOSIS — N3941 Urge incontinence: Secondary | ICD-10-CM | POA: Diagnosis not present

## 2023-07-04 DIAGNOSIS — M1990 Unspecified osteoarthritis, unspecified site: Secondary | ICD-10-CM | POA: Diagnosis not present

## 2023-07-04 DIAGNOSIS — Z85828 Personal history of other malignant neoplasm of skin: Secondary | ICD-10-CM | POA: Diagnosis not present

## 2023-07-04 DIAGNOSIS — D6949 Other primary thrombocytopenia: Secondary | ICD-10-CM | POA: Diagnosis not present

## 2023-07-06 DIAGNOSIS — M25562 Pain in left knee: Secondary | ICD-10-CM | POA: Diagnosis not present

## 2023-07-06 DIAGNOSIS — M25662 Stiffness of left knee, not elsewhere classified: Secondary | ICD-10-CM | POA: Diagnosis not present

## 2023-07-09 DIAGNOSIS — M25562 Pain in left knee: Secondary | ICD-10-CM | POA: Diagnosis not present

## 2023-07-09 DIAGNOSIS — M25662 Stiffness of left knee, not elsewhere classified: Secondary | ICD-10-CM | POA: Diagnosis not present

## 2023-07-10 DIAGNOSIS — Z96652 Presence of left artificial knee joint: Secondary | ICD-10-CM | POA: Diagnosis not present

## 2023-07-13 DIAGNOSIS — M25662 Stiffness of left knee, not elsewhere classified: Secondary | ICD-10-CM | POA: Diagnosis not present

## 2023-07-13 DIAGNOSIS — M25562 Pain in left knee: Secondary | ICD-10-CM | POA: Diagnosis not present

## 2023-07-15 DIAGNOSIS — M25562 Pain in left knee: Secondary | ICD-10-CM | POA: Diagnosis not present

## 2023-07-15 DIAGNOSIS — M25662 Stiffness of left knee, not elsewhere classified: Secondary | ICD-10-CM | POA: Diagnosis not present

## 2023-07-21 DIAGNOSIS — M25562 Pain in left knee: Secondary | ICD-10-CM | POA: Diagnosis not present

## 2023-07-21 DIAGNOSIS — M25662 Stiffness of left knee, not elsewhere classified: Secondary | ICD-10-CM | POA: Diagnosis not present

## 2023-07-23 DIAGNOSIS — M25662 Stiffness of left knee, not elsewhere classified: Secondary | ICD-10-CM | POA: Diagnosis not present

## 2023-07-23 DIAGNOSIS — M25562 Pain in left knee: Secondary | ICD-10-CM | POA: Diagnosis not present

## 2023-07-28 DIAGNOSIS — M25562 Pain in left knee: Secondary | ICD-10-CM | POA: Diagnosis not present

## 2023-07-28 DIAGNOSIS — M25662 Stiffness of left knee, not elsewhere classified: Secondary | ICD-10-CM | POA: Diagnosis not present

## 2023-07-30 DIAGNOSIS — M25662 Stiffness of left knee, not elsewhere classified: Secondary | ICD-10-CM | POA: Diagnosis not present

## 2023-07-30 DIAGNOSIS — M25562 Pain in left knee: Secondary | ICD-10-CM | POA: Diagnosis not present

## 2023-08-04 DIAGNOSIS — M25562 Pain in left knee: Secondary | ICD-10-CM | POA: Diagnosis not present

## 2023-08-04 DIAGNOSIS — M25662 Stiffness of left knee, not elsewhere classified: Secondary | ICD-10-CM | POA: Diagnosis not present

## 2023-08-06 DIAGNOSIS — M25662 Stiffness of left knee, not elsewhere classified: Secondary | ICD-10-CM | POA: Diagnosis not present

## 2023-08-06 DIAGNOSIS — M25562 Pain in left knee: Secondary | ICD-10-CM | POA: Diagnosis not present

## 2023-08-10 DIAGNOSIS — M25662 Stiffness of left knee, not elsewhere classified: Secondary | ICD-10-CM | POA: Diagnosis not present

## 2023-08-10 DIAGNOSIS — M25562 Pain in left knee: Secondary | ICD-10-CM | POA: Diagnosis not present

## 2023-08-13 DIAGNOSIS — M25662 Stiffness of left knee, not elsewhere classified: Secondary | ICD-10-CM | POA: Diagnosis not present

## 2023-08-13 DIAGNOSIS — M25562 Pain in left knee: Secondary | ICD-10-CM | POA: Diagnosis not present

## 2023-08-17 ENCOUNTER — Other Ambulatory Visit: Payer: Self-pay | Admitting: Family Medicine

## 2023-08-18 DIAGNOSIS — M25562 Pain in left knee: Secondary | ICD-10-CM | POA: Diagnosis not present

## 2023-08-18 DIAGNOSIS — M25662 Stiffness of left knee, not elsewhere classified: Secondary | ICD-10-CM | POA: Diagnosis not present

## 2023-08-18 NOTE — Telephone Encounter (Signed)
Requested Prescriptions  Pending Prescriptions Disp Refills   lisinopril-hydrochlorothiazide (ZESTORETIC) 20-25 MG tablet [Pharmacy Med Name: LISINOPRIL-HCTZ 20-25 MG TAB] 90 tablet 1    Sig: TAKE 1 TABLET BY MOUTH EVERY DAY     Cardiovascular:  ACEI + Diuretic Combos Passed - 08/17/2023 12:40 PM      Passed - Na in normal range and within 180 days    Sodium  Date Value Ref Range Status  03/17/2023 139 134 - 144 mmol/L Final         Passed - K in normal range and within 180 days    Potassium  Date Value Ref Range Status  03/17/2023 3.7 3.5 - 5.2 mmol/L Final         Passed - Cr in normal range and within 180 days    Creatinine, Ser  Date Value Ref Range Status  03/17/2023 0.94 0.57 - 1.00 mg/dL Final         Passed - eGFR is 30 or above and within 180 days    GFR calc Af Amer  Date Value Ref Range Status  09/10/2020 75 >59 mL/min/1.73 Final    Comment:    **In accordance with recommendations from the NKF-ASN Task force,**   [facility] is in the process of updating its eGFR calculation to the   2021 CKD-EPI creatinine equation that estimates kidney function   without a race variable.    GFR calc non Af Amer  Date Value Ref Range Status  09/10/2020 65 >59 mL/min/1.73 Final   eGFR  Date Value Ref Range Status  03/17/2023 63 >59 mL/min/1.73 Final         Passed - Patient is not pregnant      Passed - Last BP in normal range    BP Readings from Last 1 Encounters:  03/17/23 121/67         Passed - Valid encounter within last 6 months    Recent Outpatient Visits           5 months ago High cholesterol   [place] [facility] [place], Megan P, DO   11 months ago Routine general medical examination at a health care facility   [facility] [place], [place] P, DO   1 year ago Cellulitis of left upper extremity   [place] [facility] [place], [place] P, DO   1 year ago Cellulitis of left upper extremity   [place]  [facility] [place], Megan P, DO   1 year ago Upper respiratory tract infection, unspecified type    [facility] Dorcas Carrow, DO       Future Appointments             In 1 month Johnson, Oralia Rud, DO  [facility], [facility]

## 2023-08-21 DIAGNOSIS — M25662 Stiffness of left knee, not elsewhere classified: Secondary | ICD-10-CM | POA: Diagnosis not present

## 2023-08-21 DIAGNOSIS — M25562 Pain in left knee: Secondary | ICD-10-CM | POA: Diagnosis not present

## 2023-08-26 DIAGNOSIS — M25562 Pain in left knee: Secondary | ICD-10-CM | POA: Diagnosis not present

## 2023-08-26 DIAGNOSIS — M25662 Stiffness of left knee, not elsewhere classified: Secondary | ICD-10-CM | POA: Diagnosis not present

## 2023-08-28 DIAGNOSIS — M25662 Stiffness of left knee, not elsewhere classified: Secondary | ICD-10-CM | POA: Diagnosis not present

## 2023-08-28 DIAGNOSIS — M25562 Pain in left knee: Secondary | ICD-10-CM | POA: Diagnosis not present

## 2023-08-31 ENCOUNTER — Other Ambulatory Visit: Payer: Self-pay | Admitting: Family Medicine

## 2023-08-31 DIAGNOSIS — M25562 Pain in left knee: Secondary | ICD-10-CM | POA: Diagnosis not present

## 2023-08-31 DIAGNOSIS — M25662 Stiffness of left knee, not elsewhere classified: Secondary | ICD-10-CM | POA: Diagnosis not present

## 2023-09-01 NOTE — Telephone Encounter (Signed)
Requested Prescriptions  Pending Prescriptions Disp Refills   pravastatin (PRAVACHOL) 20 MG tablet [Pharmacy Med Name: PRAVASTATIN SODIUM 20 MG TAB] 90 tablet 1    Sig: TAKE 1 TABLET BY MOUTH EVERYDAY AT BEDTIME     Cardiovascular:  Antilipid - Statins Failed - 08/31/2023 12:04 PM      Failed - Lipid Panel in normal range within the last 12 months    Cholesterol, Total  Date Value Ref Range Status  03/17/2023 151 100 - 199 mg/dL Final   Cholesterol Piccolo, Waived  Date Value Ref Range Status  07/25/2016 164 <200 mg/dL Final    Comment:                            Desirable                <200                         Borderline High      200- 239                         High                     >239    LDL Chol Calc (NIH)  Date Value Ref Range Status  03/17/2023 80 0 - 99 mg/dL Final   HDL  Date Value Ref Range Status  03/17/2023 59 >39 mg/dL Final   Triglycerides  Date Value Ref Range Status  03/17/2023 61 0 - 149 mg/dL Final   Triglycerides Piccolo,Waived  Date Value Ref Range Status  07/25/2016 128 <150 mg/dL Final    Comment:                            Normal                   <150                         Borderline High     150 - 199                         High                200 - 499                         Very High                >499          Passed - Patient is not pregnant      Passed - Valid encounter within last 12 months    Recent Outpatient Visits           5 months ago High cholesterol   [place] [place], Megan P, DO   11 months ago Routine general medical examination at a health care facility   [facility] [place], [place] P, DO   1 year ago Cellulitis of left upper extremity   [facility] [place], Megan P, DO   1 year ago Cellulitis of left upper extremity   [place] [facility], Megan P, DO   1 year  ago Upper respiratory tract infection,  unspecified type   [place] [facility] [place], Oralia Rud, DO       Future Appointments             In 3 weeks Laural Benes, Oralia Rud, DO [place] [facility], [facility]

## 2023-09-02 DIAGNOSIS — M25562 Pain in left knee: Secondary | ICD-10-CM | POA: Diagnosis not present

## 2023-09-02 DIAGNOSIS — M25662 Stiffness of left knee, not elsewhere classified: Secondary | ICD-10-CM | POA: Diagnosis not present

## 2023-09-07 DIAGNOSIS — M25562 Pain in left knee: Secondary | ICD-10-CM | POA: Diagnosis not present

## 2023-09-07 DIAGNOSIS — M25662 Stiffness of left knee, not elsewhere classified: Secondary | ICD-10-CM | POA: Diagnosis not present

## 2023-09-09 DIAGNOSIS — M25662 Stiffness of left knee, not elsewhere classified: Secondary | ICD-10-CM | POA: Diagnosis not present

## 2023-09-09 DIAGNOSIS — M25562 Pain in left knee: Secondary | ICD-10-CM | POA: Diagnosis not present

## 2023-09-15 DIAGNOSIS — M25662 Stiffness of left knee, not elsewhere classified: Secondary | ICD-10-CM | POA: Diagnosis not present

## 2023-09-15 DIAGNOSIS — M25562 Pain in left knee: Secondary | ICD-10-CM | POA: Diagnosis not present

## 2023-09-17 ENCOUNTER — Encounter: Payer: Medicare HMO | Admitting: Family Medicine

## 2023-09-17 DIAGNOSIS — M25562 Pain in left knee: Secondary | ICD-10-CM | POA: Diagnosis not present

## 2023-09-17 DIAGNOSIS — M25662 Stiffness of left knee, not elsewhere classified: Secondary | ICD-10-CM | POA: Diagnosis not present

## 2023-09-22 ENCOUNTER — Ambulatory Visit (INDEPENDENT_AMBULATORY_CARE_PROVIDER_SITE_OTHER): Payer: Medicare HMO | Admitting: Family Medicine

## 2023-09-22 ENCOUNTER — Encounter: Payer: Self-pay | Admitting: Family Medicine

## 2023-09-22 VITALS — BP 130/68 | HR 64 | Wt 237.2 lb

## 2023-09-22 DIAGNOSIS — Z0000 Encounter for general adult medical examination without abnormal findings: Secondary | ICD-10-CM | POA: Diagnosis not present

## 2023-09-22 DIAGNOSIS — D692 Other nonthrombocytopenic purpura: Secondary | ICD-10-CM | POA: Diagnosis not present

## 2023-09-22 DIAGNOSIS — Z23 Encounter for immunization: Secondary | ICD-10-CM | POA: Diagnosis not present

## 2023-09-22 DIAGNOSIS — E7800 Pure hypercholesterolemia, unspecified: Secondary | ICD-10-CM

## 2023-09-22 DIAGNOSIS — I10 Essential (primary) hypertension: Secondary | ICD-10-CM | POA: Diagnosis not present

## 2023-09-22 LAB — MICROALBUMIN, URINE WAIVED
Creatinine, Urine Waived: 100 mg/dL (ref 10–300)
Microalb, Ur Waived: 10 mg/L (ref 0–19)
Microalb/Creat Ratio: <30 mg/g (ref <30)

## 2023-09-22 MED ORDER — LISINOPRIL-HYDROCHLOROTHIAZIDE 20-25 MG PO TABS: 1.0000 | ORAL_TABLET | Freq: Every day | ORAL | 1 refills | Status: DC

## 2023-09-22 MED ORDER — MUPIROCIN 2 % EX OINT: 1.0000 | TOPICAL_OINTMENT | Freq: Two times a day (BID) | CUTANEOUS | 0 refills | Status: AC

## 2023-09-22 MED ORDER — FLUTICASONE PROPIONATE 50 MCG/ACT NA SUSP: NASAL | 12 refills | Status: DC

## 2023-09-22 MED ORDER — CYCLOBENZAPRINE HCL 5 MG PO TABS: 5.0000 mg | ORAL_TABLET | Freq: Every day | ORAL | 0 refills | Status: DC

## 2023-09-22 NOTE — Assessment & Plan Note (Signed)
Under good control on current regimen. Continue current regimen. Continue to monitor. Call with any concerns. Refills given. Labs drawn today.   

## 2023-09-22 NOTE — Assessment & Plan Note (Signed)
Reassured patient. Continue to monitor.  

## 2023-09-22 NOTE — Progress Notes (Signed)
BP 130/68   Pulse 64   Wt 237 lb 3.2 oz (107.6 kg)   SpO2 100%   BMI 40.72 kg/m    Subjective:    Patient ID: Felicia Vargas, female    DOB: [birth year], 77 y.o.   MRN: [id number]  HPI: Felicia Vargas is a 77 y.o. female presenting on 09/22/2023 for comprehensive medical examination. Current medical complaints include:  HYPERTENSION / HYPERLIPIDEMIA Satisfied with current treatment? yes Duration of hypertension: chronic BP monitoring frequency: not checking BP medication side effects: no Past BP meds: lisinopril, hctz Duration of hyperlipidemia: chronic Cholesterol medication side effects: no Cholesterol supplements: fish oil Past cholesterol medications: pravastatin Medication compliance: excellent compliance Aspirin: yes Recent stressors: no Recurrent headaches: no Visual changes: no Palpitations: no Dyspnea: no Chest pain: no Lower extremity edema: no Dizzy/lightheaded: no  She currently lives with: daughter Menopausal Symptoms: no  Depression Screen done today and results listed below:     03/17/2023    9:13 AM 03/02/2023   10:07 AM 09/16/2022    9:26 AM 07/18/2022   11:02 AM 02/28/2022    9:33 AM  Depression screen PHQ 2/9  Decreased Interest 0 0 0 0 0  Down, Depressed, Hopeless 0 0 0 0 0  PHQ - 2 Score 0 0 0 0 0  Altered sleeping 0 0 0 1 1  Tired, decreased energy 0 0 0 0 1  Change in appetite 0 0 0 0 0  Feeling bad or failure about yourself  0 0 0 0 0  Trouble concentrating 0 0 0 0 0  Moving slowly or fidgety/restless 0 0 0 0 0  Suicidal thoughts 0 0 0 0 0  PHQ-9 Score 0 0 0 1 2  Difficult doing work/chores Not difficult at all Not difficult at all Not difficult at all      Past Medical History:  Past Medical History:  Diagnosis Date   Allergy    Arthritis    Cancer (HCC)    skin   Diffuse cystic mastopathy 2013   High cholesterol    Hypertension 1990's   Migraine    Migraines    Overactive bladder    Urinary incontinence     Surgical  History:  Past Surgical History:  Procedure Laterality Date   BREAST BIOPSY Right 05/31/1993   neg/lump   COLONOSCOPY  11/2004   Dr. 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   calcium carbonate (OS-CAL) 600 MG TABS Take 600 mg by mouth daily with breakfast.    Cholecalciferol (VITAMIN D PO) Take 1 capsule by mouth daily.   docusate sodium (COLACE) 100 MG capsule Take 100 mg by mouth daily.    loratadine (CLARITIN) 10 MG tablet Take 10 mg by mouth daily.   meloxicam (MOBIC) 15 MG tablet Take 15 mg by mouth daily.   methocarbamol (ROBAXIN) 500 MG tablet Take 500 mg by mouth 4 (four) times daily.   Omega-3 Fatty  Acids (FISH OIL PO) Take by mouth in the morning and at bedtime.   oxybutynin (DITROPAN) 5 MG tablet TAKE 1 TABLET (5 MG) BY ORAL ROUTE 2 TIMES PER DAY   pravastatin (PRAVACHOL) 20 MG tablet TAKE 1 TABLET BY MOUTH EVERYDAY AT BEDTIME   traMADol (ULTRAM) 50 MG tablet Take 50 mg by mouth every 6 (six) hours as needed.   TURMERIC PO Take 1 tablet by mouth 2 (two) times daily.    No current facility-administered medications on file prior to visit.    Allergies:  No Known Allergies  Social History:  Social History   Socioeconomic History   Marital status: Widowed    Spouse name: Not on file   Number of children: Not on file   Years of education: Not on file   Highest education level: 12th grade  Occupational History   Occupation: retired  Tobacco Use   Smoking status:  Never   Smokeless tobacco: Never  Vaping Use   Vaping status: Never Used  Substance and Sexual Activity   Alcohol use: No   Drug use: No   Sexual activity: Not Currently  Other Topics Concern   Not on file  Social History Narrative   Not on file   Social Determinants of Health   Financial Resource Strain: Low Risk  (03/02/2023)   Overall Financial Resource Strain (CARDIA)    Difficulty of Paying Living Expenses: Not hard at all  Food Insecurity: No Food Insecurity (03/02/2023)   Hunger Vital Sign    Worried About Running Out of Food in the Last Year: Never true    Ran Out of Food in the Last Year: Never true  Transportation Needs: No Transportation Needs (03/02/2023)   PRAPARE - Administrator, Civil Service (Medical): No    Lack of Transportation (Non-Medical): No  Physical Activity: Insufficiently Active (03/02/2023)   Exercise Vital Sign    Days of Exercise per Week: 7 days    Minutes of Exercise per Session: 20 min  Stress: No Stress Concern Present (03/02/2023)   [employer] of Occupational Health - Occupational Stress Questionnaire    Feeling of Stress : Not at all  Social Connections: Socially Isolated (03/02/2023)   Social Connection and Isolation Panel [NHANES]    Frequency of Communication with Friends and Family: More than three times a week    Frequency of Social Gatherings with Friends and Family: More than three times a week    Attends Religious Services: Never    Database administrator or Organizations: No    Attends Banker Meetings: Never    Marital Status: Widowed  Intimate Partner Violence: Not At Risk (03/02/2023)   Humiliation, Afraid, Rape, and Kick questionnaire    Fear of Current or Ex-Partner: No    Emotionally Abused: No    Physically Abused: No    Sexually Abused: No   Social History   Tobacco Use  Smoking Status Never  Smokeless Tobacco Never   Social History   Substance and Sexual Activity  Alcohol Use No     Family History:  Family History  Problem Relation Age of Onset   Lung cancer Father    Stroke Mother    Hypertension Sister    Arthritis Sister    Dementia Sister    Breast cancer Neg Hx     Past medical history, surgical history, medications, allergies, family history and social history reviewed with patient today and changes made to appropriate areas of  the chart.   Review of Systems  Constitutional: Negative.   HENT: Negative.         Little ulcers in her nose  Eyes: Negative.   Respiratory: Negative.    Cardiovascular:  Positive for leg swelling. Negative for chest pain, palpitations, orthopnea, claudication and PND.  Gastrointestinal: Negative.   Genitourinary: Negative.   Musculoskeletal: Negative.   Skin: Negative.   Neurological: Negative.   Endo/Heme/Allergies:  Negative for environmental allergies and polydipsia. Bruises/bleeds easily.  Psychiatric/Behavioral: Negative.     All other ROS negative except what is listed above and in the HPI.      Objective:    BP 130/68   Pulse 64   Wt 237 lb 3.2 oz (107.6 kg)   SpO2 100%   BMI 40.72 kg/m   Wt Readings from Last 3 Encounters:  09/22/23 237 lb 3.2 oz (107.6 kg)  03/17/23 233 lb 4.8 oz (105.8 kg)  03/02/23 238 lb (108 kg)    Physical Exam Vitals and nursing note reviewed.  Constitutional:      General: She is not in acute distress.    Appearance: Normal appearance. She is obese. She is not ill-appearing, toxic-appearing or diaphoretic.  HENT:     Head: Normocephalic and atraumatic.     Right Ear: Tympanic membrane, ear canal and external ear normal. There is no impacted cerumen.     Left Ear: Tympanic membrane, ear canal and external ear normal. There is no impacted cerumen.     Nose: Nose normal. No congestion or rhinorrhea.     Mouth/Throat:     Mouth: Mucous membranes are moist.     Pharynx: Oropharynx is clear. No oropharyngeal exudate or posterior oropharyngeal erythema.  Eyes:     General:  No scleral icterus.       Right eye: No discharge.        Left eye: No discharge.     Extraocular Movements: Extraocular movements intact.     Conjunctiva/sclera: Conjunctivae normal.     Pupils: Pupils are equal, round, and reactive to light.  Neck:     Vascular: No carotid bruit.  Cardiovascular:     Rate and Rhythm: Normal rate and regular rhythm.     Pulses: Normal pulses.     Heart sounds: No murmur heard.    No friction rub. No gallop.  Pulmonary:     Effort: Pulmonary effort is normal. No respiratory distress.     Breath sounds: Normal breath sounds. No stridor. No wheezing, rhonchi or rales.  Chest:     Chest wall: No tenderness.  Abdominal:     General: Abdomen is flat. Bowel sounds are normal. There is no distension.     Palpations: Abdomen is soft. There is no mass.     Tenderness: There is no abdominal tenderness. There is no right CVA tenderness, left CVA tenderness, guarding or rebound.     Hernia: No hernia is present.  Genitourinary:    Comments: Breast and pelvic exams deferred with shared decision making Musculoskeletal:        General: No swelling, tenderness, deformity or signs of injury.     Cervical back: Normal range of motion and neck supple. No rigidity. No muscular tenderness.     Right lower leg: No edema.     Left lower leg: No edema.  Lymphadenopathy:     Cervical: No cervical adenopathy.  Skin:    General: Skin is warm and dry.     Capillary Refill: Capillary  refill takes less than 2 seconds.     Coloration: Skin is not jaundiced or pale.     Findings: No bruising, erythema, lesion or rash.  Neurological:     General: No focal deficit present.     Mental Status: She is alert and oriented to person, place, and time. Mental status is at baseline.     Cranial Nerves: No cranial nerve deficit.     Sensory: No sensory deficit.     Motor: No weakness.     Coordination: Coordination normal.     Gait: Gait normal.     Deep Tendon Reflexes: Reflexes  normal.  Psychiatric:        Mood and Affect: Mood normal.        Behavior: Behavior normal.        Thought Content: Thought content normal.        Judgment: Judgment normal.     Results for orders placed or performed in visit on 03/17/23  Lipid Panel w/o Chol/HDL Ratio  Result Value Ref Range   Cholesterol, Total 151 100 - 199 mg/dL   Triglycerides 61 0 - 149 mg/dL   HDL 59 >46 mg/dL   VLDL Cholesterol Cal 12 5 - 40 mg/dL   LDL Chol Calc (NIH) 80 0 - 99 mg/dL  Comprehensive metabolic panel  Result Value Ref Range   Glucose 91 70 - 99 mg/dL   BUN 28 (H) 8 - 27 mg/dL   Creatinine, Ser 9.62 0.57 - 1.00 mg/dL   eGFR 63 >95 MW/UXL/2.44   BUN/Creatinine Ratio 30 (H) 12 - 28   Sodium 139 134 - 144 mmol/L   Potassium 3.7 3.5 - 5.2 mmol/L   Chloride 104 96 - 106 mmol/L   CO2 20 20 - 29 mmol/L   Calcium 9.8 8.7 - 10.3 mg/dL   Total Protein 6.4 6.0 - 8.5 g/dL   Albumin 3.9 3.8 - 4.8 g/dL   Globulin, Total 2.5 1.5 - 4.5 g/dL   Albumin/Globulin Ratio 1.6 1.2 - 2.2   Bilirubin Total 0.7 0.0 - 1.2 mg/dL   Alkaline Phosphatase 108 44 - 121 IU/L   AST 23 0 - 40 IU/L   ALT 18 0 - 32 IU/L      Assessment & Plan:   Problem List Items Addressed This Visit       Cardiovascular and Mediastinum   Hypertension    Under good control on current regimen. Continue current regimen. Continue to monitor. Call with any concerns. Refills given. Labs drawn today.       Relevant Medications   lisinopril-hydrochlorothiazide (ZESTORETIC) 20-25 MG tablet   Other Relevant Orders   Comprehensive metabolic panel   TSH   Microalbumin, Urine Waived   Senile purpura (HCC)    Reassured patient. Continue to monitor.       Relevant Medications   lisinopril-hydrochlorothiazide (ZESTORETIC) 20-25 MG tablet   Other Relevant Orders   CBC with Differential/Platelet     Other   High cholesterol    Under good control on current regimen. Continue current regimen. Continue to monitor. Call with any  concerns. Refills given. Labs drawn today.        Relevant Medications   lisinopril-hydrochlorothiazide (ZESTORETIC) 20-25 MG tablet   Other Relevant Orders   Comprehensive metabolic panel   Lipid Panel w/o Chol/HDL Ratio   Other Visit Diagnoses     Routine general medical examination at a health care facility    -  Primary   Vaccines up  to date. Screening labs checked today. Mammo, DEXA and colonoscopy up to date. Continue diet and exercise. Call with any concerns.   Needs flu shot       Flu shot given today.   Relevant Orders   Flu Vaccine Trivalent High Dose (Fluad) (Completed)        Follow up plan: Return in about 6 months (around 03/21/2024).   LABORATORY TESTING:  - Pap smear: not applicable  IMMUNIZATIONS:   - Tdap: Tetanus vaccination status reviewed: last tetanus booster within 10 years. - Influenza: Administered today - Pneumovax: Up to date - Prevnar: Up to date - COVID: Refused - HPV: Not applicable - Shingrix vaccine: Refused  SCREENING: -Mammogram: Up to date  - Colonoscopy: Not applicable  - Bone Density: Up to date   PATIENT COUNSELING:   Advised to take 1 mg of folate supplement per day if capable of pregnancy.   Sexuality: Discussed sexually transmitted diseases, partner selection, use of condoms, avoidance of unintended pregnancy  and contraceptive alternatives.   Advised to avoid cigarette smoking.  I discussed with the patient that most people either abstain from alcohol or drink within safe limits (<=14/week and <=4 drinks/occasion for males, <=7/weeks and <= 3 drinks/occasion for females) and that the risk for alcohol disorders and other health effects rises proportionally with the number of drinks per week and how often a drinker exceeds daily limits.  Discussed cessation/primary prevention of drug use and availability of treatment for abuse.   Diet: Encouraged to adjust caloric intake to maintain  or achieve ideal body weight, to reduce  intake of dietary saturated fat and total fat, to limit sodium intake by avoiding high sodium foods and not adding table salt, and to maintain adequate dietary potassium and calcium preferably from fresh fruits, vegetables, and low-fat dairy products.    stressed the importance of regular exercise  Injury prevention: Discussed safety belts, safety helmets, smoke detector, smoking near bedding or upholstery.   Dental health: Discussed importance of regular tooth brushing, flossing, and dental visits.    NEXT PREVENTATIVE PHYSICAL DUE IN 1 YEAR. Return in about 6 months (around 03/21/2024).

## 2023-09-23 LAB — CBC WITH DIFFERENTIAL/PLATELET
Basophils Absolute: 0.1 10*3/uL (ref 0.0–0.2)
Basos: 2 %
EOS (ABSOLUTE): 0.2 10*3/uL (ref 0.0–0.4)
Eos: 3 %
Hematocrit: 35.5 % (ref 34.0–46.6)
Hemoglobin: 11.6 g/dL (ref 11.1–15.9)
Immature Grans (Abs): 0 10*3/uL (ref 0.0–0.1)
Immature Granulocytes: 0 %
Lymphocytes Absolute: 1 10*3/uL (ref 0.7–3.1)
Lymphs: 20 %
MCH: 31.8 pg (ref 26.6–33.0)
MCHC: 32.7 g/dL (ref 31.5–35.7)
MCV: 97 fL (ref 79–97)
Monocytes Absolute: 0.4 10*3/uL (ref 0.1–0.9)
Monocytes: 9 %
Neutrophils Absolute: 3.3 10*3/uL (ref 1.4–7.0)
Neutrophils: 66 %
Platelets: 208 10*3/uL (ref 150–450)
RBC: 3.65 x10E6/uL — ABNORMAL LOW (ref 3.77–5.28)
RDW: 11.9 % (ref 11.7–15.4)
WBC: 5 10*3/uL (ref 3.4–10.8)

## 2023-09-23 LAB — COMPREHENSIVE METABOLIC PANEL
ALT: 14 [IU]/L (ref 0–32)
AST: 19 [IU]/L (ref 0–40)
Albumin: 4 g/dL (ref 3.8–4.8)
Alkaline Phosphatase: 115 [IU]/L (ref 44–121)
BUN/Creatinine Ratio: 24 (ref 12–28)
BUN: 23 mg/dL (ref 8–27)
Bilirubin Total: 0.7 mg/dL (ref 0.0–1.2)
CO2: 21 mmol/L (ref 20–29)
Calcium: 9.8 mg/dL (ref 8.7–10.3)
Chloride: 104 mmol/L (ref 96–106)
Creatinine, Ser: 0.97 mg/dL (ref 0.57–1.00)
Globulin, Total: 2.7 g/dL (ref 1.5–4.5)
Glucose: 96 mg/dL (ref 70–99)
Potassium: 3.9 mmol/L (ref 3.5–5.2)
Sodium: 140 mmol/L (ref 134–144)
Total Protein: 6.7 g/dL (ref 6.0–8.5)
eGFR: 61 mL/min/{1.73_m2} (ref >59)

## 2023-09-23 LAB — LIPID PANEL W/O CHOL/HDL RATIO
Cholesterol, Total: 153 mg/dL (ref 100–199)
HDL: 56 mg/dL (ref >39)
LDL Chol Calc (NIH): 84 mg/dL (ref 0–99)
Triglycerides: 67 mg/dL (ref 0–149)
VLDL Cholesterol Cal: 13 mg/dL (ref 5–40)

## 2023-09-23 LAB — TSH: TSH: 2.21 u[IU]/mL (ref 0.450–4.500)

## 2023-09-24 DIAGNOSIS — M25662 Stiffness of left knee, not elsewhere classified: Secondary | ICD-10-CM | POA: Diagnosis not present

## 2023-09-24 DIAGNOSIS — M25562 Pain in left knee: Secondary | ICD-10-CM | POA: Diagnosis not present

## 2023-09-29 DIAGNOSIS — M25662 Stiffness of left knee, not elsewhere classified: Secondary | ICD-10-CM | POA: Diagnosis not present

## 2023-09-29 DIAGNOSIS — M25562 Pain in left knee: Secondary | ICD-10-CM | POA: Diagnosis not present

## 2023-10-05 DIAGNOSIS — M25662 Stiffness of left knee, not elsewhere classified: Secondary | ICD-10-CM | POA: Diagnosis not present

## 2023-10-05 DIAGNOSIS — M25562 Pain in left knee: Secondary | ICD-10-CM | POA: Diagnosis not present

## 2023-10-06 ENCOUNTER — Encounter: Payer: Self-pay | Admitting: Family Medicine

## 2023-10-13 DIAGNOSIS — M25562 Pain in left knee: Secondary | ICD-10-CM | POA: Diagnosis not present

## 2023-10-13 DIAGNOSIS — M25662 Stiffness of left knee, not elsewhere classified: Secondary | ICD-10-CM | POA: Diagnosis not present

## 2023-10-21 DIAGNOSIS — M25662 Stiffness of left knee, not elsewhere classified: Secondary | ICD-10-CM | POA: Diagnosis not present

## 2023-10-21 DIAGNOSIS — M25562 Pain in left knee: Secondary | ICD-10-CM | POA: Diagnosis not present

## 2023-10-23 ENCOUNTER — Encounter: Payer: Self-pay | Admitting: Family Medicine

## 2023-12-18 ENCOUNTER — Other Ambulatory Visit: Payer: Self-pay | Admitting: Family Medicine

## 2023-12-18 NOTE — Telephone Encounter (Signed)
Requested Prescriptions  Pending Prescriptions Disp Refills   oxybutynin (DITROPAN) 5 MG tablet [Pharmacy Med Name: OXYBUTYNIN 5 MG TABLET] 180 tablet 0    Sig: TAKE 1 TABLET BY MOUTH TWICE A DAY     Urology:  Bladder Agents Passed - 12/18/2023  2:28 PM      Passed - Valid encounter within last 12 months    Recent Outpatient Visits           2 months ago Routine general medical examination at a health care facility   [facility], Megan P, DO   9 months ago High cholesterol   [facility] [place], [place] P, DO   1 year ago Routine general medical examination at a health care facility   [facility] [place], [place] P, DO   1 year ago Cellulitis of left upper extremity   [facility] [place], [place] P, DO   1 year ago Cellulitis of left upper extremity   [facility] Dor[place] Carrow, DO       Future Appointments             In 3 months Laural Benes, Oralia Rud, DO  [facility]

## 2024-01-11 DIAGNOSIS — Z859 Personal history of malignant neoplasm, unspecified: Secondary | ICD-10-CM | POA: Diagnosis not present

## 2024-01-11 DIAGNOSIS — L578 Other skin changes due to chronic exposure to nonionizing radiation: Secondary | ICD-10-CM | POA: Diagnosis not present

## 2024-01-11 DIAGNOSIS — Z872 Personal history of diseases of the skin and subcutaneous tissue: Secondary | ICD-10-CM | POA: Diagnosis not present

## 2024-01-11 DIAGNOSIS — L570 Actinic keratosis: Secondary | ICD-10-CM | POA: Diagnosis not present

## 2024-01-13 ENCOUNTER — Telehealth: Payer: Self-pay | Admitting: Family Medicine

## 2024-01-13 NOTE — Telephone Encounter (Signed)
 Patient dropped off document Handicap Placard, to be filled out by provider. Patient requested to send it back via Call Patient to pick up within 5-days. Document is located in providers folder. Please advise at Mobile [phone] (mobile)

## 2024-01-14 NOTE — Telephone Encounter (Signed)
 Paperwork prefilled and sent to provider for review and completion.

## 2024-01-26 ENCOUNTER — Other Ambulatory Visit: Payer: Self-pay | Admitting: Family Medicine

## 2024-01-27 NOTE — Telephone Encounter (Signed)
 Lipid panel in date.  Requested Prescriptions  Pending Prescriptions Disp Refills   pravastatin (PRAVACHOL) 20 MG tablet [Pharmacy Med Name: PRAVASTATIN SODIUM 20 MG TAB] 90 tablet 0    Sig: TAKE 1 TABLET BY MOUTH EVERYDAY AT BEDTIME     Cardiovascular:  Antilipid - Statins Failed - 01/27/2024  7:58 AM      Failed - Lipid Panel in normal range within the last 12 months    Cholesterol, Total  Date Value Ref Range Status  09/22/2023 153 100 - 199 mg/dL Final   Cholesterol Piccolo, Waived  Date Value Ref Range Status  07/25/2016 164 <200 mg/dL Final    Comment:                            Desirable                <200                         Borderline High      200- 239                         High                     >239    LDL Chol Calc (NIH)  Date Value Ref Range Status  09/22/2023 84 0 - 99 mg/dL Final   HDL  Date Value Ref Range Status  09/22/2023 56 >39 mg/dL Final   Triglycerides  Date Value Ref Range Status  09/22/2023 67 0 - 149 mg/dL Final   Triglycerides Piccolo,Waived  Date Value Ref Range Status  07/25/2016 128 <150 mg/dL Final    Comment:                            Normal                   <150                         Borderline High     150 - 199                         High                200 - 499                         Very High                >499          Passed - Patient is not pregnant      Passed - Valid encounter within last 12 months    Recent Outpatient Visits           4 months ago Routine general medical examination at a health care facility   [facility], Megan P, DO   10 months ago High cholesterol   [facility] [place], [place] P, DO   1 year ago Routine general medical examination at a health care facility   [facility] [place], [place] P, DO   1 year ago Cellulitis of left upper extremity   [facility] [place], Megan P, DO    1 year ago Cellulitis of left upper extremity   [facility] Dorcas Carrow, DO       Future Appointments             In 1 month Laural Benes, Oralia Rud, DO [facility]

## 2024-02-19 DIAGNOSIS — H52223 Regular astigmatism, bilateral: Secondary | ICD-10-CM | POA: Diagnosis not present

## 2024-02-19 DIAGNOSIS — H2513 Age-related nuclear cataract, bilateral: Secondary | ICD-10-CM | POA: Diagnosis not present

## 2024-02-19 DIAGNOSIS — H25013 Cortical age-related cataract, bilateral: Secondary | ICD-10-CM | POA: Diagnosis not present

## 2024-02-19 DIAGNOSIS — Z135 Encounter for screening for eye and ear disorders: Secondary | ICD-10-CM | POA: Diagnosis not present

## 2024-02-19 DIAGNOSIS — H5203 Hypermetropia, bilateral: Secondary | ICD-10-CM | POA: Diagnosis not present

## 2024-02-19 DIAGNOSIS — H524 Presbyopia: Secondary | ICD-10-CM | POA: Diagnosis not present

## 2024-03-10 ENCOUNTER — Ambulatory Visit (INDEPENDENT_AMBULATORY_CARE_PROVIDER_SITE_OTHER): Payer: Self-pay | Admitting: Emergency Medicine

## 2024-03-10 VITALS — BP 129/66 | HR 71 | Ht 65.0 in | Wt 239.0 lb

## 2024-03-10 DIAGNOSIS — Z780 Asymptomatic menopausal state: Secondary | ICD-10-CM | POA: Diagnosis not present

## 2024-03-10 DIAGNOSIS — Z0000 Encounter for general adult medical examination without abnormal findings: Secondary | ICD-10-CM

## 2024-03-10 DIAGNOSIS — Z1231 Encounter for screening mammogram for malignant neoplasm of breast: Secondary | ICD-10-CM

## 2024-03-10 NOTE — Progress Notes (Signed)
 Subjective:   Felicia Vargas is a 78 y.o. who presents for a Medicare Wellness preventive visit.  Visit Complete: Virtual I connected with  Felicia Vargas on 03/10/24 by a audio enabled telemedicine application and verified that I am speaking with the correct person using two identifiers.  Patient Location: Home  Provider Location: Home Office  I discussed the limitations of evaluation and management by telemedicine. The patient expressed understanding and agreed to proceed.  Vital Signs: Because this visit was a virtual/telehealth visit, some criteria may be missing or patient reported. Any vitals not documented were not able to be obtained and vitals that have been documented are patient reported.  VideoDeclined- This patient declined Librarian, academic. Therefore the visit was completed with audio only.  Persons Participating in Visit: Patient.  AWV Questionnaire: No: Patient Medicare AWV questionnaire was not completed prior to this visit.  Cardiac Risk Factors include: advanced age (>81men, >7 women);dyslipidemia;hypertension;obesity (BMI >30kg/m2)     Objective:    Today's Vitals   03/10/24 0917 03/10/24 0919  BP: 129/66   Pulse: 71   Weight: 239 lb (108.4 kg)   Height: 5\' 5"  (1.651 m)   PainSc:  4    Body mass index is 39.77 kg/m.     03/10/2024    9:33 AM 03/02/2023   10:10 AM 02/24/2022   10:38 AM 02/22/2021   10:40 AM 02/13/2020    3:35 PM 02/10/2019    3:26 PM 02/01/2018    3:13 PM  Advanced Directives  Does Patient Have a Medical Advance Directive? Yes No Yes Yes Yes Yes Yes  Type of Estate agent of [place];Living will  Healthcare Power of [employer] of [place];Living will Living will;Healthcare Power of Attorney Living will;Healthcare Power of [employer] Power of [place];Living will  Does patient want to make changes to medical advance directive? No - Patient declined        Copy  of Healthcare Power of Attorney in Chart? No - copy requested  No - copy requested No - copy requested No - copy requested  No - copy requested  Would patient like information on creating a medical advance directive?  No - Patient declined         Current Medications (verified) Outpatient Encounter Medications as of 03/10/2024  Medication Sig   aspirin 81 MG tablet Take 81 mg by mouth daily.   calcium carbonate (OS-CAL) 600 MG TABS Take 600 mg by mouth daily with breakfast.    Cholecalciferol (VITAMIN D PO) Take 1 capsule by mouth daily.   cyclobenzaprine  (FLEXERIL ) 5 MG tablet Take 1-2 tablets (5-10 mg total) by mouth at bedtime.   docusate sodium (COLACE) 100 MG capsule Take 100 mg by mouth daily.    fluticasone  (FLONASE ) 50 MCG/ACT nasal spray SPRAY 1 SPRAY INTO EACH NOSTRIL DAILY   lisinopril -hydrochlorothiazide  (ZESTORETIC ) 20-25 MG tablet Take 1 tablet by mouth daily.   loratadine (CLARITIN) 10 MG tablet Take 10 mg by mouth daily.   mupirocin  ointment (BACTROBAN ) 2 % Apply 1 Application topically 2 (two) times daily.   Omega-3 Fatty Acids (FISH OIL PO) Take by mouth in the morning and at bedtime.   oxybutynin  (DITROPAN ) 5 MG tablet TAKE 1 TABLET BY MOUTH TWICE A DAY   pravastatin  (PRAVACHOL ) 20 MG tablet TAKE 1 TABLET BY MOUTH EVERYDAY AT BEDTIME   TURMERIC PO Take 1 tablet by mouth 2 (two) times daily.    meloxicam (MOBIC) 15 MG tablet  Take 15 mg by mouth daily. (Patient not taking: Reported on 03/10/2024)   methocarbamol (ROBAXIN) 500 MG tablet Take 500 mg by mouth 4 (four) times daily. (Patient not taking: Reported on 03/10/2024)   traMADol (ULTRAM) 50 MG tablet Take 50 mg by mouth every 6 (six) hours as needed. (Patient not taking: Reported on 03/10/2024)   No facility-administered encounter medications on file as of 03/10/2024.    Allergies (verified) Patient has no known allergies.   History: Past Medical History:  Diagnosis Date   Allergy    Arthritis    Cancer (HCC)     skin   Diffuse cystic mastopathy 2013   High cholesterol    Hypertension 1990's   Migraine    Migraines    Overactive bladder    Urinary incontinence    Past Surgical History:  Procedure Laterality Date   BREAST BIOPSY Right 05/31/1993   neg/lump   COLONOSCOPY  11/2004   Dr. 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   REPLACEMENT TOTAL KNEE Left 05/18/2023   shin surgery Left 02/04/2021   SQUAMOUS CELL CARCINOMA EXCISION  02/04/2021   left leg   Family History  Problem Relation Age of Onset   Lung cancer Father    Stroke Mother    Hypertension Sister    Arthritis Sister    Dementia Sister    Breast cancer Neg Hx    Social History   Socioeconomic History   Marital status: Widowed    Spouse name: Not on file   Number of children: 1   Years of education: Not on file   Highest education level: 12th grade  Occupational History   Occupation: retired  Tobacco Use   Smoking status: Never    Passive exposure: Past   Smokeless tobacco: Never  Vaping Use   Vaping status: Never Used  Substance and Sexual Activity   Alcohol use: Yes    Comment: rarely drinks a beer   Drug use: No   Sexual activity: Not Currently  Other Topics Concern   Not on file  Social History Narrative   Not on file   Social Drivers of Health   Financial Resource Strain: Low Risk  (03/10/2024)   Overall Financial Resource Strain (CARDIA)    Difficulty of Paying Living Expenses: Not hard at all  Food Insecurity: No Food Insecurity (03/10/2024)   Hunger Vital Sign    Worried About Running Out of Food in the Last Year: Never true    Ran Out of Food in the Last Year: Never true  Transportation Needs: No Transportation Needs (03/10/2024)   PRAPARE - Therapist, art (Medical): No    Lack of Transportation (Non-Medical): No  Physical Activity: Insufficiently Active (03/10/2024)   Exercise Vital Sign    Days of Exercise per Week: 3 days    Minutes of Exercise per Session: 30 min  Stress: No Stress Concern Present (03/10/2024)   [employer] of Occupational Health - Occupational Stress Questionnaire    Feeling of Stress : Not at all  Social Connections: Moderately Isolated (03/10/2024)   Social Connection and Isolation Panel [NHANES]    Frequency of Communication with Friends and Family: More than three times a week    Frequency  of Social Gatherings with Friends and Family: More than three times a week    Attends Religious Services: 1 to 4 times per year    Active Member of [employer] or Organizations: No    Attends Banker Meetings: Never    Marital Status: Widowed    Tobacco Counseling Counseling given: Not Answered    Clinical Intake:  Pre-visit preparation completed: Yes  Pain : 0-10 Pain Score: 4  Pain Type: Chronic pain Pain Location: Knee Pain Orientation: Right, Left Pain Descriptors / Indicators: Aching     BMI - recorded: 39.77 Nutritional Status: BMI > 30  Obese Nutritional Risks: None Diabetes: No  No results found for: "HGBA1C"   How often do you need to have someone help you when you read instructions, pamphlets, or other written materials from your doctor or pharmacy?: 1 - Never  Interpreter Needed?: No  Information entered by :: Jaunita Messier, CMA   Activities of Daily Living     03/10/2024    9:21 AM  In your present state of health, do you have any difficulty performing the following activities:  Hearing? 0  Vision? 0  Difficulty concentrating or making decisions? 0  Walking or climbing stairs? 1  Comment uses cane  Dressing or bathing? 0  Doing errands, shopping? 0  Preparing Food and eating ? N  Using the Toilet? N  In the past six months, have you accidently  leaked urine? Y  Comment wears pad  Do you have problems with loss of bowel control? N  Managing your Medications? N  Managing your Finances? N  Housekeeping or managing your Housekeeping? N    Patient Care Team: Solomon Dupre, DO as PCP - General (Family Medicine) Burnie Cartwright, RN as Registered Nurse Mariane Shire, MD (Dermatology) Jerlyn Moons, MD as Referring Physician (Orthopedic Surgery)  Indicate any recent Medical Services you may have received from other than [facility] providers in the past year (date may be approximate).     Assessment:   This is a routine wellness examination for Shizuko.  Hearing/Vision screen Hearing Screening - Comments:: Denies hearing loss Vision Screening - Comments:: Gets eye exams, [facility] [place]   Goals Addressed               This Visit's Progress     Weight (lb) < 200 lb (90.7 kg) (pt-stated)   239 lb (108.4 kg)      Depression Screen     03/10/2024    9:31 AM 03/17/2023    9:13 AM 03/02/2023   10:07 AM 09/16/2022    9:26 AM 07/18/2022   11:02 AM 02/28/2022    9:33 AM 02/24/2022   10:44 AM  PHQ 2/9 Scores  PHQ - 2 Score 0 0 0 0 0 0 0  PHQ- 9 Score 1 0 0 0 1 2 0    Fall Risk     03/10/2024    9:36 AM 03/17/2023    9:12 AM 03/02/2023   10:10 AM 02/28/2022    9:33 AM 02/24/2022   10:33 AM  Fall Risk   Falls in the past year? 0 0 0 1 0  Number falls in past yr: 0 0 0 0 0  Injury with Fall? 0 0 0 0 0  Risk for fall due to : No Fall Risks No Fall Risks No Fall Risks History of fall(s);Impaired balance/gait Impaired balance/gait  Follow up Falls prevention discussed;Falls evaluation completed Falls evaluation completed Falls  prevention discussed;Falls evaluation completed Falls evaluation completed Falls evaluation completed;Education provided;Falls prevention discussed    MEDICARE RISK AT HOME:  Medicare Risk at Home Any stairs in or around the home?: Yes If so, are there any without handrails?:  No Home free of loose throw rugs in walkways, pet beds, electrical cords, etc?: Yes Adequate lighting in your home to reduce risk of falls?: Yes Life alert?: No Use of a cane, walker or w/c?: Yes (cane) Grab bars in the bathroom?: No Shower chair or bench in shower?: Yes Elevated toilet seat or a handicapped toilet?: Yes  TIMED UP AND GO:  Was the test performed?  No  Cognitive Function: 6CIT completed        03/10/2024    9:37 AM 03/02/2023   10:12 AM 02/24/2022   10:39 AM 02/22/2021   10:44 AM 02/10/2019    3:29 PM  6CIT Screen  What Year? 0 points 0 points 0 points 0 points 0 points  What month? 0 points 0 points 0 points 0 points 0 points  What time? 0 points 0 points 0 points 0 points 0 points  Count back from 20 0 points 0 points 0 points 0 points 0 points  Months in reverse 0 points 0 points 0 points 0 points 0 points  Repeat phrase 4 points 0 points 0 points 0 points 0 points  Total Score 4 points 0 points 0 points 0 points 0 points    Immunizations Immunization History  Administered Date(s) Administered   Fluad Quad(high Dose 65+) 08/22/2019, 09/10/2020, 09/10/2021, 09/16/2022   Fluad Trivalent(High Dose 65+) 09/22/2023   Influenza, High Dose Seasonal PF 08/25/2016, 08/07/2017, 08/09/2018   Influenza-Unspecified 08/31/2015   PFIZER(Purple Top)SARS-COV-2 Vaccination 01/01/2020, 01/25/2020, 09/25/2020   Pneumococcal Conjugate-13 01/18/2016   Pneumococcal Polysaccharide-23 02/02/2017   Td 01/18/2016    Screening Tests Health Maintenance  Topic Date Due   Zoster Vaccines- Shingrix (1 of 2) Never done   DEXA SCAN  05/01/2024   MAMMOGRAM  05/10/2024   INFLUENZA VACCINE  06/17/2024   Medicare Annual Wellness (AWV)  03/10/2025   DTaP/Tdap/Td (2 - Tdap) 01/17/2026   Pneumonia Vaccine 21+ Years old  Completed   Hepatitis C Screening  Completed   HPV VACCINES  Aged Out   Meningococcal B Vaccine  Aged Out   Colonoscopy  Discontinued   COVID-19 Vaccine  Discontinued     Health Maintenance  Health Maintenance Due  Topic Date Due   Zoster Vaccines- Shingrix (1 of 2) Never done   Health Maintenance Items Addressed: Mammogram ordered, DEXA ordered, See Nurse Notes  Additional Screening:  Vision Screening: Recommended annual ophthalmology exams for early detection of glaucoma and other disorders of the eye.  Dental Screening: Recommended annual dental exams for proper oral hygiene  Community Resource Referral / Chronic Care Management: CRR required this visit?  No   CCM required this visit?  No     Plan:     I have personally reviewed and noted the following in the patient's chart:   Medical and social history Use of alcohol, tobacco or illicit drugs  Current medications and supplements including opioid prescriptions. Patient is not currently taking opioid prescriptions. Functional ability and status Nutritional status Physical activity Advanced directives List of other physicians Hospitalizations, surgeries, and ER visits in previous 12 months Vitals Screenings to include cognitive, depression, and falls Referrals and appointments  In addition, I have reviewed and discussed with patient certain preventive protocols, quality metrics, and best practice recommendations. A  written personalized care plan for preventive services as well as general preventive health recommendations were provided to patient.     Jaunita Messier, CMA   03/10/2024   After Visit Summary: (MyChart) Due to this being a telephonic visit, the after visit summary with patients personalized plan was offered to patient via MyChart   Notes: 6 CIT Score - 4 Placed order for MMG (due 05/10/24) and DEXA (due 05/01/24) Declined covid and shingles vaccines  Colon cancer screening no longer required due to age

## 2024-03-10 NOTE — Patient Instructions (Addendum)
 Felicia Vargas , Thank you for taking time to come for your Medicare Wellness Visit. I appreciate your ongoing commitment to your health goals. Please review the following plan we discussed and let me know if I can assist you in the future.   Referrals/Orders/Follow-Ups/Clinician Recommendations: I have placed and order for a mammogram (due 05/10/24) and a bone density test (due 05/01/24). You may schedule these at the same time. Call [facility] @ [phone] to schedule.  This is a list of the screening recommended for you and due dates:  Health Maintenance  Topic Date Due   Zoster (Shingles) Vaccine (1 of 2) Never done   DEXA scan (bone density measurement)  05/01/2024   Mammogram  05/10/2024   Flu Shot  06/17/2024   Medicare Annual Wellness Visit  03/10/2025   DTaP/Tdap/Td vaccine (2 - Tdap) 01/17/2026   Pneumonia Vaccine  Completed   Hepatitis C Screening  Completed   HPV Vaccine  Aged Out   Meningitis B Vaccine  Aged Out   Colon Cancer Screening  Discontinued   COVID-19 Vaccine  Discontinued    Advanced directives: (Copy Requested) Please bring a copy of your health care power of attorney and living will to the office to be added to your chart at your convenience. You can mail to [facility] [address] [place], [place] [address] or email to [email or web address]  Next Medicare Annual Wellness Visit scheduled for next year: Yes, 03/23/25 @ 8:40am (phone visit)

## 2024-03-16 ENCOUNTER — Other Ambulatory Visit: Payer: Self-pay | Admitting: Internal Medicine

## 2024-03-18 ENCOUNTER — Ambulatory Visit (INDEPENDENT_AMBULATORY_CARE_PROVIDER_SITE_OTHER): Payer: Self-pay | Admitting: Family Medicine

## 2024-03-18 ENCOUNTER — Telehealth: Payer: Self-pay | Admitting: Family Medicine

## 2024-03-18 ENCOUNTER — Encounter: Payer: Self-pay | Admitting: Family Medicine

## 2024-03-18 VITALS — BP 117/69 | HR 62 | Ht 65.0 in | Wt 242.4 lb

## 2024-03-18 DIAGNOSIS — I10 Essential (primary) hypertension: Secondary | ICD-10-CM

## 2024-03-18 DIAGNOSIS — E7800 Pure hypercholesterolemia, unspecified: Secondary | ICD-10-CM

## 2024-03-18 DIAGNOSIS — D692 Other nonthrombocytopenic purpura: Secondary | ICD-10-CM | POA: Diagnosis not present

## 2024-03-18 DIAGNOSIS — N3281 Overactive bladder: Secondary | ICD-10-CM

## 2024-03-18 MED ORDER — OXYBUTYNIN CHLORIDE 5 MG PO TABS: 5.0000 mg | ORAL_TABLET | Freq: Two times a day (BID) | ORAL | 1 refills | Status: DC

## 2024-03-18 MED ORDER — LISINOPRIL-HYDROCHLOROTHIAZIDE 20-25 MG PO TABS: 1.0000 | ORAL_TABLET | Freq: Every day | ORAL | 1 refills | Status: DC

## 2024-03-18 MED ORDER — PRAVASTATIN SODIUM 20 MG PO TABS: ORAL_TABLET | ORAL | 1 refills | Status: DC

## 2024-03-18 NOTE — Assessment & Plan Note (Signed)
 Under good control on current regimen. Continue current regimen. Continue to monitor. Call with any concerns. Refills given. Labs drawn today.

## 2024-03-18 NOTE — Assessment & Plan Note (Signed)
Reassured patient today. Continue to monitor. Call with any concerns.

## 2024-03-18 NOTE — Telephone Encounter (Signed)
 Patient was seen today for 6 month follow. She is requesting refills of all meds sent to [facility]. Requested name for meds to be filled. She was not sure but stated she was out of just about all of her meds. Please follow up with patient. [phone].

## 2024-03-18 NOTE — Assessment & Plan Note (Signed)
 Encouraged diet and exercise with goal of losing 1-2lbs per week. Call with any concerns.

## 2024-03-18 NOTE — Assessment & Plan Note (Signed)
 Under good control on current regimen. Continue current regimen. Continue to monitor. Call with any concerns. Refills given.

## 2024-03-18 NOTE — Progress Notes (Signed)
 BP 117/69 (BP Location: Left Arm, Patient Position: Sitting, Cuff Size: Large)   Pulse 62   Ht 5\' 5"  (1.651 m)   Wt 242 lb 6.4 oz (110 kg)   SpO2 95%   BMI 40.34 kg/m    Subjective:    Patient ID: Felicia Vargas, female    DOB: 1946/06/29, 78 y.o.   MRN: [id number]  HPI: Felicia Vargas is a 78 y.o. female  Chief Complaint  Patient presents with   Hypertension   Hyperlipidemia   HYPERTENSION / HYPERLIPIDEMIA Satisfied with current treatment? yes Duration of hypertension: chronic BP monitoring frequency: not checking BP medication side effects: no Past BP meds: lisinopril , HCTZ Duration of hyperlipidemia: chronic Cholesterol medication side effects: no Cholesterol supplements: fish oil Past cholesterol medications: pravastatin  Medication compliance: excellent compliance Aspirin: yes Recent stressors: no Recurrent headaches: no Visual changes: no Palpitations: no Dyspnea: no Chest pain: no Lower extremity edema: no Dizzy/lightheaded: no   Relevant past medical, surgical, family and social history reviewed and updated as indicated. Interim medical history since our last visit reviewed. Allergies and medications reviewed and updated.  Review of Systems  Constitutional: Negative.   Respiratory: Negative.    Cardiovascular: Negative.   Musculoskeletal: Negative.   Neurological: Negative.   Psychiatric/Behavioral: Negative.      Per HPI unless specifically indicated above     Objective:    BP 117/69 (BP Location: Left Arm, Patient Position: Sitting, Cuff Size: Large)   Pulse 62   Ht 5\' 5"  (1.651 m)   Wt 242 lb 6.4 oz (110 kg)   SpO2 95%   BMI 40.34 kg/m   Wt Readings from Last 3 Encounters:  03/18/24 242 lb 6.4 oz (110 kg)  03/10/24 239 lb (108.4 kg)  09/22/23 237 lb 3.2 oz (107.6 kg)    Physical Exam Vitals and nursing note reviewed.  Constitutional:      General: She is not in acute distress.    Appearance: Normal appearance. She is obese. She  is not ill-appearing, toxic-appearing or diaphoretic.  HENT:     Head: Normocephalic and atraumatic.     Right Ear: External ear normal.     Left Ear: External ear normal.     Nose: Nose normal.     Mouth/Throat:     Mouth: Mucous membranes are moist.     Pharynx: Oropharynx is clear.  Eyes:     General: No scleral icterus.       Right eye: No discharge.        Left eye: No discharge.     Extraocular Movements: Extraocular movements intact.     Conjunctiva/sclera: Conjunctivae normal.     Pupils: Pupils are equal, round, and reactive to light.  Cardiovascular:     Rate and Rhythm: Normal rate and regular rhythm.     Pulses: Normal pulses.     Heart sounds: Normal heart sounds. No murmur heard.    No friction rub. No gallop.  Pulmonary:     Effort: Pulmonary effort is normal. No respiratory distress.     Breath sounds: Normal breath sounds. No stridor. No wheezing, rhonchi or rales.  Chest:     Chest wall: No tenderness.  Musculoskeletal:        General: Normal range of motion.     Cervical back: Normal range of motion and neck supple.  Skin:    General: Skin is warm and dry.     Capillary Refill: Capillary refill takes less than 2 seconds.  Coloration: Skin is not jaundiced or pale.     Findings: No bruising, erythema, lesion or rash.  Neurological:     General: No focal deficit present.     Mental Status: She is alert and oriented to person, place, and time. Mental status is at baseline.  Psychiatric:        Mood and Affect: Mood normal.        Behavior: Behavior normal.        Thought Content: Thought content normal.        Judgment: Judgment normal.     Results for orders placed or performed in visit on 09/22/23  Microalbumin, Urine Waived   Collection Time: 09/22/23  9:47 AM  Result Value Ref Range   Microalb, Ur Waived 10 0 - 19 mg/L   Creatinine, Urine Waived 100 10 - 300 mg/dL   Microalb/Creat Ratio <30 <30 mg/g  CBC with Differential/Platelet    Collection Time: 09/22/23  9:48 AM  Result Value Ref Range   WBC 5.0 3.4 - 10.8 x10E3/uL   RBC 3.65 (L) 3.77 - 5.28 x10E6/uL   Hemoglobin 11.6 11.1 - 15.9 g/dL   Hematocrit 62.1 30.8 - 46.6 %   MCV 97 79 - 97 fL   MCH 31.8 26.6 - 33.0 pg   MCHC 32.7 31.5 - 35.7 g/dL   RDW 65.7 84.6 - 96.2 %   Platelets 208 150 - 450 x10E3/uL   Neutrophils 66 Not Estab. %   Lymphs 20 Not Estab. %   Monocytes 9 Not Estab. %   Eos 3 Not Estab. %   Basos 2 Not Estab. %   Neutrophils Absolute 3.3 1.4 - 7.0 x10E3/uL   Lymphocytes Absolute 1.0 0.7 - 3.1 x10E3/uL   Monocytes Absolute 0.4 0.1 - 0.9 x10E3/uL   EOS (ABSOLUTE) 0.2 0.0 - 0.4 x10E3/uL   Basophils Absolute 0.1 0.0 - 0.2 x10E3/uL   Immature Granulocytes 0 Not Estab. %   Immature Grans (Abs) 0.0 0.0 - 0.1 x10E3/uL  Comprehensive metabolic panel   Collection Time: 09/22/23  9:48 AM  Result Value Ref Range   Glucose 96 70 - 99 mg/dL   BUN 23 8 - 27 mg/dL   Creatinine, Ser 9.52 0.57 - 1.00 mg/dL   eGFR 61 >84 XL/KGM/0.10   BUN/Creatinine Ratio 24 12 - 28   Sodium 140 134 - 144 mmol/L   Potassium 3.9 3.5 - 5.2 mmol/L   Chloride 104 96 - 106 mmol/L   CO2 21 20 - 29 mmol/L   Calcium 9.8 8.7 - 10.3 mg/dL   Total Protein 6.7 6.0 - 8.5 g/dL   Albumin 4.0 3.8 - 4.8 g/dL   Globulin, Total 2.7 1.5 - 4.5 g/dL   Bilirubin Total 0.7 0.0 - 1.2 mg/dL   Alkaline Phosphatase 115 44 - 121 IU/L   AST 19 0 - 40 IU/L   ALT 14 0 - 32 IU/L  Lipid Panel w/o Chol/HDL Ratio   Collection Time: 09/22/23  9:48 AM  Result Value Ref Range   Cholesterol, Total 153 100 - 199 mg/dL   Triglycerides 67 0 - 149 mg/dL   HDL 56 >27 mg/dL   VLDL Cholesterol Cal 13 5 - 40 mg/dL   LDL Chol Calc (NIH) 84 0 - 99 mg/dL  TSH   Collection Time: 09/22/23  9:48 AM  Result Value Ref Range   TSH 2.210 0.450 - 4.500 uIU/mL      Assessment & Plan:   Problem List Items Addressed This  Visit       Cardiovascular and Mediastinum   Hypertension - Primary   Under good control on  current regimen. Continue current regimen. Continue to monitor. Call with any concerns. Refills given. Labs drawn today.        Relevant Orders   Comprehensive metabolic panel with GFR   Senile purpura (HCC)   Reassured patient today. Continue to monitor. Call with any concerns.       Relevant Orders   CBC with Differential/Platelet   Comprehensive metabolic panel with GFR     Genitourinary   OAB (overactive bladder)   Under good control on current regimen. Continue current regimen. Continue to monitor. Call with any concerns. Refills given.         Other   High cholesterol   Under good control on current regimen. Continue current regimen. Continue to monitor. Call with any concerns. Refills given. Labs drawn today.       Relevant Orders   Comprehensive metabolic panel with GFR   Lipid Panel w/o Chol/HDL Ratio   Obesity, morbid (HCC)   Encouraged diet and exercise with goal of losing 1-2lbs per week. Call with any concerns.         Follow up plan: Return in about 6 months (around 09/18/2024) for physical.

## 2024-03-18 NOTE — Telephone Encounter (Signed)
 Meds sent over this AM

## 2024-03-19 ENCOUNTER — Other Ambulatory Visit: Payer: Self-pay | Admitting: Family Medicine

## 2024-03-19 LAB — COMPREHENSIVE METABOLIC PANEL WITH GFR
ALT: 15 IU/L (ref 0–32)
AST: 18 IU/L (ref 0–40)
Albumin: 4.1 g/dL (ref 3.8–4.8)
Alkaline Phosphatase: 112 IU/L (ref 44–121)
BUN/Creatinine Ratio: 31 — ABNORMAL HIGH (ref 12–28)
BUN: 35 mg/dL — ABNORMAL HIGH (ref 8–27)
Bilirubin Total: 0.6 mg/dL (ref 0.0–1.2)
CO2: 21 mmol/L (ref 20–29)
Calcium: 9.4 mg/dL (ref 8.7–10.3)
Chloride: 104 mmol/L (ref 96–106)
Creatinine, Ser: 1.12 mg/dL — ABNORMAL HIGH (ref 0.57–1.00)
Globulin, Total: 2.3 g/dL (ref 1.5–4.5)
Glucose: 97 mg/dL (ref 70–99)
Potassium: 4 mmol/L (ref 3.5–5.2)
Sodium: 137 mmol/L (ref 134–144)
Total Protein: 6.4 g/dL (ref 6.0–8.5)
eGFR: 51 mL/min/{1.73_m2} — ABNORMAL LOW (ref >59)

## 2024-03-19 LAB — CBC WITH DIFFERENTIAL/PLATELET
Basophils Absolute: 0.1 10*3/uL (ref 0.0–0.2)
Basos: 2 %
EOS (ABSOLUTE): 0.2 10*3/uL (ref 0.0–0.4)
Eos: 5 %
Hematocrit: 35 % (ref 34.0–46.6)
Hemoglobin: 11.7 g/dL (ref 11.1–15.9)
Immature Grans (Abs): 0 10*3/uL (ref 0.0–0.1)
Immature Granulocytes: 0 %
Lymphocytes Absolute: 1.3 10*3/uL (ref 0.7–3.1)
Lymphs: 33 %
MCH: 32.8 pg (ref 26.6–33.0)
MCHC: 33.4 g/dL (ref 31.5–35.7)
MCV: 98 fL — ABNORMAL HIGH (ref 79–97)
Monocytes Absolute: 0.4 10*3/uL (ref 0.1–0.9)
Monocytes: 9 %
Neutrophils Absolute: 2 10*3/uL (ref 1.4–7.0)
Neutrophils: 51 %
Platelets: 202 10*3/uL (ref 150–450)
RBC: 3.57 x10E6/uL — ABNORMAL LOW (ref 3.77–5.28)
RDW: 11.7 % (ref 11.7–15.4)
WBC: 3.9 10*3/uL (ref 3.4–10.8)

## 2024-03-19 LAB — LIPID PANEL W/O CHOL/HDL RATIO
Cholesterol, Total: 156 mg/dL (ref 100–199)
HDL: 50 mg/dL (ref >39)
LDL Chol Calc (NIH): 93 mg/dL (ref 0–99)
Triglycerides: 64 mg/dL (ref 0–149)
VLDL Cholesterol Cal: 13 mg/dL (ref 5–40)

## 2024-03-21 ENCOUNTER — Ambulatory Visit: Payer: Medicare HMO | Admitting: Family Medicine

## 2024-03-22 ENCOUNTER — Ambulatory Visit: Payer: Medicare HMO | Admitting: Family Medicine

## 2024-03-22 NOTE — Telephone Encounter (Signed)
 Requested medication (s) are due for refill today - yes  Requested medication (s) are on the active medication list -yes  Future visit scheduled -yes  Last refill: 10/02/23 #180  Notes to clinic: non delegated Rx  Requested Prescriptions  Pending Prescriptions Disp Refills   cyclobenzaprine  (FLEXERIL ) 5 MG tablet [Pharmacy Med Name: CYCLOBENZAPRINE  5 MG TABLET] 180 tablet 0    Sig: TAKE 1-2 TABLETS (5-10 MG TOTAL) BY MOUTH AT BEDTIME.     Not Delegated - Analgesics:  Muscle Relaxants Failed - 03/22/2024 11:51 AM      Failed - This refill cannot be delegated      Passed - Valid encounter within last 6 months    Recent Outpatient Visits           4 days ago Primary hypertension   [place] [facility] [place], [place] P, DO                 Requested Prescriptions  Pending Prescriptions Disp Refills   cyclobenzaprine  (FLEXERIL ) 5 MG tablet [Pharmacy Med Name: CYCLOBENZAPRINE  5 MG TABLET] 180 tablet 0    Sig: TAKE 1-2 TABLETS (5-10 MG TOTAL) BY MOUTH AT BEDTIME.     Not Delegated - Analgesics:  Muscle Relaxants Failed - 03/22/2024 11:51 AM      Failed - This refill cannot be delegated      Passed - Valid encounter within last 6 months    Recent Outpatient Visits           4 days ago Primary hypertension   [place] [facility] [place], [place], DO

## 2024-03-24 DIAGNOSIS — H04123 Dry eye syndrome of bilateral lacrimal glands: Secondary | ICD-10-CM | POA: Diagnosis not present

## 2024-03-24 DIAGNOSIS — H25013 Cortical age-related cataract, bilateral: Secondary | ICD-10-CM | POA: Diagnosis not present

## 2024-03-24 DIAGNOSIS — H2513 Age-related nuclear cataract, bilateral: Secondary | ICD-10-CM | POA: Diagnosis not present

## 2024-03-27 ENCOUNTER — Other Ambulatory Visit: Payer: Self-pay | Admitting: Family Medicine

## 2024-03-27 ENCOUNTER — Encounter: Payer: Self-pay | Admitting: Family Medicine

## 2024-03-27 DIAGNOSIS — N289 Disorder of kidney and ureter, unspecified: Secondary | ICD-10-CM

## 2024-03-28 NOTE — Progress Notes (Signed)
 Lab appt scheduled.

## 2024-04-12 DIAGNOSIS — H2512 Age-related nuclear cataract, left eye: Secondary | ICD-10-CM | POA: Diagnosis not present

## 2024-04-12 DIAGNOSIS — H25012 Cortical age-related cataract, left eye: Secondary | ICD-10-CM | POA: Diagnosis not present

## 2024-04-12 DIAGNOSIS — H25011 Cortical age-related cataract, right eye: Secondary | ICD-10-CM | POA: Diagnosis not present

## 2024-04-12 DIAGNOSIS — H25013 Cortical age-related cataract, bilateral: Secondary | ICD-10-CM | POA: Diagnosis not present

## 2024-04-12 DIAGNOSIS — H2511 Age-related nuclear cataract, right eye: Secondary | ICD-10-CM | POA: Diagnosis not present

## 2024-04-12 DIAGNOSIS — H2513 Age-related nuclear cataract, bilateral: Secondary | ICD-10-CM | POA: Diagnosis not present

## 2024-04-13 ENCOUNTER — Other Ambulatory Visit

## 2024-04-13 DIAGNOSIS — N289 Disorder of kidney and ureter, unspecified: Secondary | ICD-10-CM | POA: Diagnosis not present

## 2024-04-14 LAB — BASIC METABOLIC PANEL WITH GFR
BUN/Creatinine Ratio: 27 (ref 12–28)
BUN: 27 mg/dL (ref 8–27)
CO2: 20 mmol/L (ref 20–29)
Calcium: 9.7 mg/dL (ref 8.7–10.3)
Chloride: 103 mmol/L (ref 96–106)
Creatinine, Ser: 1.01 mg/dL — ABNORMAL HIGH (ref 0.57–1.00)
Glucose: 92 mg/dL (ref 70–99)
Potassium: 3.8 mmol/L (ref 3.5–5.2)
Sodium: 139 mmol/L (ref 134–144)
eGFR: 58 mL/min/{1.73_m2} — ABNORMAL LOW (ref >59)

## 2024-04-25 ENCOUNTER — Ambulatory Visit: Payer: Self-pay | Admitting: Family Medicine

## 2024-05-13 NOTE — Discharge Instructions (Signed)

## 2024-05-16 ENCOUNTER — Ambulatory Visit
Admission: RE | Admit: 2024-05-16 | Discharge: 2024-05-16 | Disposition: A | Discharge status: Home / Self Care | Attending: Ophthalmology | Admitting: Ophthalmology

## 2024-05-16 ENCOUNTER — Encounter: Payer: Self-pay | Admitting: Ophthalmology

## 2024-05-16 ENCOUNTER — Other Ambulatory Visit: Payer: Self-pay

## 2024-05-16 ENCOUNTER — Ambulatory Visit: Payer: Self-pay | Admitting: Anesthesiology

## 2024-05-16 ENCOUNTER — Encounter
Admission: RE | Disposition: A | Discharge status: Home / Self Care | Payer: Self-pay | Source: Home / Self Care | Attending: Ophthalmology

## 2024-05-16 DIAGNOSIS — Z6841 Body Mass Index (BMI) 40.0 and over, adult: Secondary | ICD-10-CM | POA: Diagnosis not present

## 2024-05-16 DIAGNOSIS — H2511 Age-related nuclear cataract, right eye: Secondary | ICD-10-CM | POA: Diagnosis not present

## 2024-05-16 DIAGNOSIS — I10 Essential (primary) hypertension: Secondary | ICD-10-CM | POA: Diagnosis not present

## 2024-05-16 DIAGNOSIS — H25011 Cortical age-related cataract, right eye: Secondary | ICD-10-CM | POA: Diagnosis not present

## 2024-05-16 HISTORY — PX: CATARACT EXTRACTION W/PHACO: SHX586

## 2024-05-16 HISTORY — DX: Morbid (severe) obesity due to excess calories: E66.01

## 2024-05-16 SURGERY — PHACOEMULSIFICATION, CATARACT, WITH IOL INSERTION
Anesthesia: Monitor Anesthesia Care | Laterality: Right

## 2024-05-16 MED ORDER — ARMC OPHTHALMIC DILATING DROPS
OPHTHALMIC | Status: AC
  Filled 2024-05-16: qty 0.5

## 2024-05-16 MED ORDER — SIGHTPATH DOSE#1 BSS IO SOLN
INTRAOCULAR | Status: DC | PRN
  Administered 2024-05-16: 15 mL via INTRAOCULAR

## 2024-05-16 MED ORDER — TETRACAINE HCL 0.5 % OP SOLN
OPHTHALMIC | Status: AC
  Filled 2024-05-16: qty 4

## 2024-05-16 MED ORDER — FENTANYL CITRATE (PF) 100 MCG/2ML IJ SOLN
INTRAMUSCULAR | Status: AC
  Filled 2024-05-16: qty 2

## 2024-05-16 MED ORDER — FENTANYL CITRATE (PF) 100 MCG/2ML IJ SOLN
INTRAMUSCULAR | Status: DC | PRN
  Administered 2024-05-16: 50 ug via INTRAVENOUS

## 2024-05-16 MED ORDER — LACTATED RINGERS IV SOLN: INTRAVENOUS | Status: DC

## 2024-05-16 MED ORDER — MIDAZOLAM HCL 2 MG/2ML IJ SOLN
INTRAMUSCULAR | Status: DC | PRN
  Administered 2024-05-16: 1 mg via INTRAVENOUS

## 2024-05-16 MED ORDER — SIGHTPATH DOSE#1 BSS IO SOLN
INTRAOCULAR | Status: DC | PRN
  Administered 2024-05-16: 85 mL via OPHTHALMIC

## 2024-05-16 MED ORDER — MIDAZOLAM HCL 2 MG/2ML IJ SOLN
INTRAMUSCULAR | Status: AC
  Filled 2024-05-16: qty 2

## 2024-05-16 MED ORDER — LIDOCAINE HCL (PF) 2 % IJ SOLN
INTRAOCULAR | Status: DC | PRN
  Administered 2024-05-16: 1 mL via INTRAOCULAR

## 2024-05-16 MED ORDER — MOXIFLOXACIN HCL 0.5 % OP SOLN
OPHTHALMIC | Status: DC | PRN
Start: 2024-05-16 — End: 2024-05-16
  Administered 2024-05-16: .2 mL via OPHTHALMIC

## 2024-05-16 MED ORDER — SIGHTPATH DOSE#1 NA HYALUR & NA CHOND-NA HYALUR IO KIT
PACK | INTRAOCULAR | Status: DC | PRN
  Administered 2024-05-16: 1 via OPHTHALMIC

## 2024-05-16 MED ORDER — TETRACAINE HCL 0.5 % OP SOLN
1.0000 [drp] | OPHTHALMIC | Status: DC | PRN
  Administered 2024-05-16 (×3): 1 [drp] via OPHTHALMIC

## 2024-05-16 MED ORDER — ARMC OPHTHALMIC DILATING DROPS
1.0000 | OPHTHALMIC | Status: DC | PRN
  Administered 2024-05-16 (×4): 1 via OPHTHALMIC

## 2024-05-16 SURGICAL SUPPLY — 12 items
CATARACT SUITE SIGHTPATH (MISCELLANEOUS) ×1 IMPLANT
DISSECTOR HYDRO NUCLEUS 50X22 (MISCELLANEOUS) ×1 IMPLANT
FEE CATARACT SUITE SIGHTPATH (MISCELLANEOUS) ×1 IMPLANT
GLOVE PI ULTRA LF STRL 7.5 (GLOVE) ×1 IMPLANT
GLOVE SURG POLYISOPRENE 8.5 (GLOVE) ×1 IMPLANT
GLOVE SURG PROTEXIS BL SZ6.5 (GLOVE) ×1 IMPLANT
GLOVE SURG SYN 6.5 PF PI BL (GLOVE) ×1 IMPLANT
GLOVE SURG SYN 8.5 PF PI BL (GLOVE) ×1 IMPLANT
LENS IOL TECNIS EYHANCE 22.5 (Intraocular Lens) IMPLANT
NDL FILTER BLUNT 18X1 1/2 (NEEDLE) ×1 IMPLANT
NEEDLE FILTER BLUNT 18X1 1/2 (NEEDLE) ×1 IMPLANT
SYR 3ML LL SCALE MARK (SYRINGE) ×1 IMPLANT

## 2024-05-16 NOTE — H&P (Signed)
 [facility]   Primary Care Physician:  Vicci Duwaine SQUIBB, DO Ophthalmologist: Dr. Adine Novak  Pre-Procedure History & Physical: HPI:  Felicia Vargas is a 78 y.o. female here for cataract surgery.   Past Medical History:  Diagnosis Date   Allergy    Arthritis    Cancer (HCC)    skin   Diffuse cystic mastopathy 2013   High cholesterol    Hypertension 1990's   Migraine    Migraines    Morbid obesity (HCC)    Overactive bladder    Urinary incontinence     Past Surgical History:  Procedure Laterality Date   BREAST BIOPSY Right 05/31/1993   neg/lump   COLONOSCOPY  11/2004   Dr. Matthew   COLONOSCOPY WITH PROPOFOL  N/A 10/15/2016   Procedure: COLONOSCOPY WITH PROPOFOL ;  Surgeon: Louanne KANDICE Muse, MD;  Location: [facility] ENDOSCOPY;  Service: Endoscopy;  Laterality: N/A;   KIDNEY SURGERY  1998   LITHOTRIPSY  1998   REPLACEMENT TOTAL KNEE Right 08/28/2022   REPLACEMENT TOTAL KNEE Left 05/18/2023   shin surgery Left 02/04/2021   SQUAMOUS CELL CARCINOMA EXCISION  02/04/2021   left leg    Prior to Admission medications   Medication Sig Start Date End Date Taking? Authorizing Provider  aspirin 81 MG tablet Take 81 mg by mouth daily.   Yes [provider]  calcium carbonate (OS-CAL) 600 MG TABS Take 600 mg by mouth daily with breakfast.    Yes [provider]  Cholecalciferol (VITAMIN D PO) Take 1 capsule by mouth daily.   Yes [provider]  lisinopril -hydrochlorothiazide  (ZESTORETIC ) 20-25 MG tablet Take 1 tablet by mouth daily. 03/18/24  Yes Johnson, Megan P, DO  loratadine (CLARITIN) 10 MG tablet Take 10 mg by mouth daily.   Yes [provider]  Omega-3 Fatty Acids (FISH OIL PO) Take by mouth in the morning and at bedtime.   Yes [provider]  oxybutynin  (DITROPAN ) 5 MG tablet Take 1 tablet (5 mg total) by mouth 2 (two) times daily. 03/18/24  Yes Johnson, Megan P, DO  pravastatin  (PRAVACHOL ) 20 MG tablet TAKE 1 TABLET BY  MOUTH EVERYDAY AT BEDTIME 03/18/24  Yes Johnson, Megan P, DO  TURMERIC PO Take 1 tablet by mouth 2 (two) times daily.    Yes [provider]  cyclobenzaprine  (FLEXERIL ) 5 MG tablet TAKE 1-2 TABLETS (5-10 MG TOTAL) BY MOUTH AT BEDTIME. 03/23/24   Melvin Pao, NP  docusate sodium (COLACE) 100 MG capsule Take 100 mg by mouth daily.     [provider]  fluticasone  (FLONASE ) 50 MCG/ACT nasal spray SPRAY 1 SPRAY INTO EACH NOSTRIL DAILY 09/22/23   Johnson, Megan P, DO  mupirocin  ointment (BACTROBAN ) 2 % Apply 1 Application topically 2 (two) times daily. 09/22/23   Vicci Duwaine SQUIBB, DO    Allergies as of 03/28/2024   (No Known Allergies)    Family History  Problem Relation Age of Onset   Lung cancer Father    Stroke Mother    Hypertension Sister    Arthritis Sister    Dementia Sister    Breast cancer Neg Hx     Social History   Socioeconomic History   Marital status: Widowed    Spouse name: Not on file   Number of children: 1   Years of education: Not on file   Highest education level: 12th grade  Occupational History   Occupation: retired  Tobacco Use   Smoking status: Never    Passive  exposure: Past   Smokeless tobacco: Never  Vaping Use   Vaping status: Never Used  Substance and Sexual Activity   Alcohol use: Yes    Comment: rarely drinks a beer   Drug use: No   Sexual activity: Not Currently  Other Topics Concern   Not on file  Social History Narrative   Not on file   Social Drivers of Health   Financial Resource Strain: Low Risk  (03/10/2024)   Overall Financial Resource Strain (CARDIA)    Difficulty of Paying Living Expenses: Not hard at all  Food Insecurity: No Food Insecurity (03/10/2024)   Hunger Vital Sign    Worried About Running Out of Food in the Last Year: Never true    Ran Out of Food in the Last Year: Never true  Transportation Needs: No Transportation Needs (03/10/2024)   PRAPARE - Administrator, Civil Service (Medical):  No    Lack of Transportation (Non-Medical): No  Physical Activity: Insufficiently Active (03/10/2024)   Exercise Vital Sign    Days of Exercise per Week: 3 days    Minutes of Exercise per Session: 30 min  Stress: No Stress Concern Present (03/10/2024)   [employer] of Occupational Health - Occupational Stress Questionnaire    Feeling of Stress : Not at all  Social Connections: Moderately Isolated (03/10/2024)   Social Connection and Isolation Panel    Frequency of Communication with Friends and Family: More than three times a week    Frequency of Social Gatherings with Friends and Family: More than three times a week    Attends Religious Services: 1 to 4 times per year    Active Member of [employer] or Organizations: No    Attends Banker Meetings: Never    Marital Status: Widowed  Intimate Partner Violence: Not At Risk (03/10/2024)   Humiliation, Afraid, Rape, and Kick questionnaire    Fear of Current or Ex-Partner: No    Emotionally Abused: No    Physically Abused: No    Sexually Abused: No    Review of Systems: See HPI, otherwise negative ROS  Physical Exam: BP (!) 153/65   Pulse 70   Temp 98.2 F (36.8 C) (Temporal)   Resp 18   Ht 5' 5 (1.651 m)   Wt 112 kg   SpO2 98%   BMI 41.10 kg/m  General:   Alert, cooperative. Head:  Normocephalic and atraumatic. Respiratory:  Normal work of breathing. Cardiovascular:  NAD  Impression/Plan: Felicia Vargas is here for cataract surgery.  Risks, benefits, limitations, and alternatives regarding cataract surgery have been reviewed with the patient.  Questions have been answered.  All parties agreeable.   Adine Novak, MD  05/16/2024, 8:25 AM

## 2024-05-16 NOTE — Transfer of Care (Signed)
 Immediate Anesthesia Transfer of Care Note  Patient: Felicia Vargas  Procedure(s) Performed: PHACOEMULSIFICATION, CATARACT, WITH IOL INSERTION 3.29, 00:27.9 (Right)  Patient Location: PACU  Anesthesia Type: MAC  Level of Consciousness: awake, alert  and patient cooperative  Airway and Oxygen Therapy: Patient Spontanous Breathing and Patient connected to supplemental oxygen  Post-op Assessment: Post-op Vital signs reviewed, Patient's Cardiovascular Status Stable, Respiratory Function Stable, Patent Airway and No signs of Nausea or vomiting  Post-op Vital Signs: Reviewed and stable  Complications: No notable events documented.

## 2024-05-16 NOTE — Op Note (Signed)
 OPERATIVE NOTE  Felicia Vargas [id number] 05/16/2024   PREOPERATIVE DIAGNOSIS:  Nuclear sclerotic cataract right eye.  H25.11   POSTOPERATIVE DIAGNOSIS:    Nuclear sclerotic cataract right eye.     PROCEDURE:  Phacoemusification with posterior chamber intraocular lens placement of the right eye   LENS:   Implant Name Type Inv. Item Serial No. Manufacturer Lot No. LRB No. Used Action  LENS IOL TECNIS EYHANCE 22.5 - [id number] Intraocular Lens LENS IOL TECNIS EYHANCE 22.5 [id number] SIGHTPATH  Right 1 Implanted       Procedure(s): PHACOEMULSIFICATION, CATARACT, WITH IOL INSERTION 3.29, 00:27.9 (Right)  SURGEON:  Adine Novak, MD, MPH  ANESTHESIOLOGIST: Anesthesiologist: Ola Donny BROCKS, MD CRNA: Levy Harvey, CRNA   ANESTHESIA:  Topical with tetracaine drops augmented with 1% preservative-free intracameral lidocaine .  ESTIMATED BLOOD LOSS: less than 1 mL.   COMPLICATIONS:  None.   DESCRIPTION OF PROCEDURE:  The patient was identified in the holding room and transported to the operating room and placed in the supine position under the operating microscope.  The right eye was identified as the operative eye and it was prepped and draped in the usual sterile ophthalmic fashion.   A 1.0 millimeter clear-corneal paracentesis was made at the 10:30 position. 0.5 ml of preservative-free 1% lidocaine  with epinephrine was injected into the anterior chamber.  The anterior chamber was filled with viscoelastic.  A 2.4 millimeter keratome was used to make a near-clear corneal incision at the 8:00 position.  A curvilinear capsulorrhexis was made with a cystotome and capsulorrhexis forceps.  Balanced salt solution was used to hydrodissect and hydrodelineate the nucleus.   Phacoemulsification was then used in stop and chop fashion to remove the lens nucleus and epinucleus.  The remaining cortex was then removed using the irrigation and aspiration handpiece. Viscoelastic was then placed into the  capsular bag to distend it for lens placement.  A lens was then injected into the capsular bag.  The remaining viscoelastic was aspirated.   Wounds were hydrated with balanced salt solution.  The anterior chamber was inflated to a physiologic pressure with balanced salt solution.   Intracameral vigamox 0.1 mL undiluted was injected into the eye and a drop placed onto the ocular surface.  No wound leaks were noted.  The patient was taken to the recovery room in stable condition without complications of anesthesia or surgery  Adine Novak 05/16/2024, 8:51 AM

## 2024-05-16 NOTE — Anesthesia Postprocedure Evaluation (Signed)
 Anesthesia Post Note  Patient: Felicia Vargas  Procedure(s) Performed: PHACOEMULSIFICATION, CATARACT, WITH IOL INSERTION 3.29, 00:27.9 (Right)  Patient location during evaluation: PACU Anesthesia Type: MAC Level of consciousness: awake and alert Pain management: pain level controlled Vital Signs Assessment: post-procedure vital signs reviewed and stable Respiratory status: spontaneous breathing, nonlabored ventilation, respiratory function stable and patient connected to nasal cannula oxygen Cardiovascular status: stable and blood pressure returned to baseline Postop Assessment: no apparent nausea or vomiting Anesthetic complications: no   No notable events documented.   Last Vitals:  Vitals:   05/16/24 0853 05/16/24 0858  BP: (!) 140/63 (!) 150/79  Pulse: 67 (!) 57  Resp: 17 16  Temp: (!) 35.8 C (!) 35.8 C  SpO2: 100% 100%    Last Pain:  Vitals:   05/16/24 0858  TempSrc:   PainSc: 0-No pain                 Khyran Riera C Jarmar Rousseau

## 2024-05-16 NOTE — Anesthesia Preprocedure Evaluation (Addendum)
 Anesthesia Evaluation  Patient identified by MRN, date of birth, ID band Patient awake    Reviewed: Allergy & Precautions, H&P , NPO status , Patient's Chart, lab work & pertinent test results  Airway Mallampati: III  TM Distance: >3 FB Neck ROM: Full    Dental no notable dental hx. (+) Edentulous Upper Has two remaining lower teeth, these are not loose, did not put her dentures in place:   Pulmonary neg pulmonary ROS   Pulmonary exam normal breath sounds clear to auscultation       Cardiovascular hypertension, + Peripheral Vascular Disease  Normal cardiovascular exam Rhythm:Regular Rate:Normal     Neuro/Psych  Headaches negative neurological ROS  negative psych ROS   GI/Hepatic negative GI ROS, Neg liver ROS,,,  Endo/Other  negative endocrine ROS    Renal/GU negative Renal ROS  negative genitourinary   Musculoskeletal negative musculoskeletal ROS (+) Arthritis ,    Abdominal   Peds negative pediatric ROS (+)  Hematology negative hematology ROS (+)   Anesthesia Other Findings Medical History  Hypertension  Diffuse cystic mastopathy Migraines  High cholesterol Allergy  Overactive bladder Arthritis  Migraine Urinary incontinence  Cancer (HCC)     Reproductive/Obstetrics negative OB ROS                             Anesthesia Physical Anesthesia Plan  ASA: 3  Anesthesia Plan: MAC   Post-op Pain Management:    Induction: Intravenous  PONV Risk Score and Plan:   Airway Management Planned: Natural Airway and Nasal Cannula  Additional Equipment:   Intra-op Plan:   Post-operative Plan:   Informed Consent: I have reviewed the patients History and Physical, chart, labs and discussed the procedure including the risks, benefits and alternatives for the proposed anesthesia with the patient or authorized representative who has indicated his/her understanding and acceptance.      Dental Advisory Given  Plan Discussed with: Anesthesiologist, CRNA and Surgeon  Anesthesia Plan Comments: (Patient consented for risks of anesthesia including but not limited to:  - adverse reactions to medications - damage to eyes, teeth, lips or other oral mucosa - nerve damage due to positioning  - sore throat or hoarseness - Damage to heart, brain, nerves, lungs, other parts of body or loss of life  Patient voiced understanding and assent.)        Anesthesia Quick Evaluation

## 2024-05-17 ENCOUNTER — Encounter: Payer: Self-pay | Admitting: Ophthalmology

## 2024-05-17 DIAGNOSIS — H2512 Age-related nuclear cataract, left eye: Secondary | ICD-10-CM | POA: Diagnosis not present

## 2024-05-23 ENCOUNTER — Other Ambulatory Visit

## 2024-05-23 ENCOUNTER — Ambulatory Visit

## 2024-06-08 ENCOUNTER — Ambulatory Visit
Admission: RE | Admit: 2024-06-08 | Discharge: 2024-06-08 | Disposition: A | Discharge status: Home / Self Care | Source: Ambulatory Visit | Attending: Family Medicine | Admitting: Family Medicine

## 2024-06-08 ENCOUNTER — Ambulatory Visit
Admission: RE | Admit: 2024-06-08 | Discharge: 2024-06-08 | Disposition: A | Discharge status: Home / Self Care | Source: Ambulatory Visit | Attending: Family Medicine

## 2024-06-08 ENCOUNTER — Ambulatory Visit: Payer: Self-pay | Admitting: Family Medicine

## 2024-06-08 DIAGNOSIS — Z1231 Encounter for screening mammogram for malignant neoplasm of breast: Secondary | ICD-10-CM | POA: Insufficient documentation

## 2024-06-08 DIAGNOSIS — Z780 Asymptomatic menopausal state: Secondary | ICD-10-CM | POA: Insufficient documentation

## 2024-06-08 MED ORDER — ALENDRONATE SODIUM 70 MG PO TABS: 70.0000 mg | ORAL_TABLET | ORAL | 11 refills | Status: DC

## 2024-06-09 NOTE — Discharge Instructions (Signed)

## 2024-06-13 ENCOUNTER — Ambulatory Visit: Payer: Self-pay | Admitting: Anesthesiology

## 2024-06-13 ENCOUNTER — Encounter
Admission: RE | Disposition: A | Discharge status: Home / Self Care | Payer: Self-pay | Source: Home / Self Care | Attending: Ophthalmology

## 2024-06-13 ENCOUNTER — Other Ambulatory Visit: Payer: Self-pay

## 2024-06-13 ENCOUNTER — Ambulatory Visit
Admission: RE | Admit: 2024-06-13 | Discharge: 2024-06-13 | Disposition: A | Discharge status: Home / Self Care | Attending: Ophthalmology | Admitting: Ophthalmology

## 2024-06-13 ENCOUNTER — Encounter: Payer: Self-pay | Admitting: Ophthalmology

## 2024-06-13 DIAGNOSIS — Z6841 Body Mass Index (BMI) 40.0 and over, adult: Secondary | ICD-10-CM | POA: Insufficient documentation

## 2024-06-13 DIAGNOSIS — H25012 Cortical age-related cataract, left eye: Secondary | ICD-10-CM | POA: Diagnosis not present

## 2024-06-13 DIAGNOSIS — I10 Essential (primary) hypertension: Secondary | ICD-10-CM | POA: Insufficient documentation

## 2024-06-13 DIAGNOSIS — H2512 Age-related nuclear cataract, left eye: Secondary | ICD-10-CM | POA: Insufficient documentation

## 2024-06-13 DIAGNOSIS — M1990 Unspecified osteoarthritis, unspecified site: Secondary | ICD-10-CM | POA: Insufficient documentation

## 2024-06-13 DIAGNOSIS — H25812 Combined forms of age-related cataract, left eye: Secondary | ICD-10-CM | POA: Diagnosis not present

## 2024-06-13 HISTORY — PX: CATARACT EXTRACTION W/PHACO: SHX586

## 2024-06-13 SURGERY — PHACOEMULSIFICATION, CATARACT, WITH IOL INSERTION
Anesthesia: Monitor Anesthesia Care | Laterality: Left

## 2024-06-13 MED ORDER — FENTANYL CITRATE (PF) 100 MCG/2ML IJ SOLN
INTRAMUSCULAR | Status: AC
Start: 2024-06-13 — End: 2024-06-13
  Filled 2024-06-13: qty 2

## 2024-06-13 MED ORDER — FENTANYL CITRATE (PF) 100 MCG/2ML IJ SOLN
INTRAMUSCULAR | Status: DC | PRN
  Administered 2024-06-13: 50 ug via INTRAVENOUS

## 2024-06-13 MED ORDER — SIGHTPATH DOSE#1 NA HYALUR & NA CHOND-NA HYALUR IO KIT
PACK | INTRAOCULAR | Status: DC | PRN
  Administered 2024-06-13: 1 via OPHTHALMIC

## 2024-06-13 MED ORDER — LACTATED RINGERS IV SOLN: INTRAVENOUS | Status: DC

## 2024-06-13 MED ORDER — TETRACAINE HCL 0.5 % OP SOLN
OPHTHALMIC | Status: AC
  Filled 2024-06-13: qty 4

## 2024-06-13 MED ORDER — ARMC OPHTHALMIC DILATING DROPS
1.0000 | OPHTHALMIC | Status: DC | PRN
  Administered 2024-06-13 (×3): 1 via OPHTHALMIC

## 2024-06-13 MED ORDER — TETRACAINE HCL 0.5 % OP SOLN
1.0000 [drp] | OPHTHALMIC | Status: DC | PRN
  Administered 2024-06-13 (×3): 1 [drp] via OPHTHALMIC

## 2024-06-13 MED ORDER — MIDAZOLAM HCL 2 MG/2ML IJ SOLN
INTRAMUSCULAR | Status: DC | PRN
  Administered 2024-06-13: 1 mg via INTRAVENOUS

## 2024-06-13 MED ORDER — ARMC OPHTHALMIC DILATING DROPS
OPHTHALMIC | Status: AC
  Filled 2024-06-13: qty 0.5

## 2024-06-13 MED ORDER — MOXIFLOXACIN HCL 0.5 % OP SOLN
OPHTHALMIC | Status: DC | PRN
Start: 2024-06-13 — End: 2024-06-13
  Administered 2024-06-13: .2 mL via OPHTHALMIC

## 2024-06-13 MED ORDER — MIDAZOLAM HCL 2 MG/2ML IJ SOLN
INTRAMUSCULAR | Status: AC
  Filled 2024-06-13: qty 2

## 2024-06-13 MED ORDER — SIGHTPATH DOSE#1 BSS IO SOLN
INTRAOCULAR | Status: DC | PRN
  Administered 2024-06-13: 15 mL via INTRAOCULAR

## 2024-06-13 MED ORDER — SIGHTPATH DOSE#1 BSS IO SOLN
INTRAOCULAR | Status: DC | PRN
  Administered 2024-06-13: 90 mL via OPHTHALMIC

## 2024-06-13 MED ORDER — LIDOCAINE HCL (PF) 2 % IJ SOLN
INTRAOCULAR | Status: DC | PRN
  Administered 2024-06-13: 1 mL via INTRAOCULAR

## 2024-06-13 SURGICAL SUPPLY — 10 items
CATARACT SUITE SIGHTPATH (MISCELLANEOUS) ×1 IMPLANT
DISSECTOR HYDRO NUCLEUS 50X22 (MISCELLANEOUS) ×1 IMPLANT
FEE CATARACT SUITE SIGHTPATH (MISCELLANEOUS) ×1 IMPLANT
GLOVE PI ULTRA LF STRL 7.5 (GLOVE) ×1 IMPLANT
GLOVE SURG SYN 6.5 PF PI BL (GLOVE) ×1 IMPLANT
GLOVE SURG SYN 8.5 PF PI BL (GLOVE) ×1 IMPLANT
LENS IOL TECNIS EYHANCE 22.5 (Intraocular Lens) IMPLANT
NDL FILTER BLUNT 18X1 1/2 (NEEDLE) ×1 IMPLANT
NEEDLE FILTER BLUNT 18X1 1/2 (NEEDLE) ×1 IMPLANT
SYR 3ML LL SCALE MARK (SYRINGE) ×1 IMPLANT

## 2024-06-13 NOTE — H&P (Signed)
 [facility]   Primary Care Physician:  Vicci Duwaine SQUIBB, DO Ophthalmologist: Dr. Adine Novak  Pre-Procedure History & Physical: HPI:  Felicia Vargas is a 78 y.o. female here for cataract surgery.   Past Medical History:  Diagnosis Date   Allergy    Arthritis    Cancer (HCC)    skin   Diffuse cystic mastopathy 2013   High cholesterol    Hypertension 1990's   Migraine    Migraines    Morbid obesity (HCC)    Overactive bladder    Urinary incontinence     Past Surgical History:  Procedure Laterality Date   BREAST BIOPSY Right 05/31/1993   neg/lump   CATARACT EXTRACTION W/PHACO Right 05/16/2024   Procedure: PHACOEMULSIFICATION, CATARACT, WITH IOL INSERTION 3.29, 00:27.9;  Surgeon: Novak Adine Anes, MD;  Location: [facility];  Service: Ophthalmology;  Laterality: Right;   COLONOSCOPY  11/2004   Dr. Matthew   COLONOSCOPY WITH PROPOFOL  N/A 10/15/2016   Procedure: COLONOSCOPY WITH PROPOFOL ;  Surgeon: Louanne KANDICE Muse, MD;  Location: [facility];  Service: Endoscopy;  Laterality: N/A;   KIDNEY SURGERY  1998   LITHOTRIPSY  1998   REPLACEMENT TOTAL KNEE Right 08/28/2022   REPLACEMENT TOTAL KNEE Left 05/18/2023   shin surgery Left 02/04/2021   SQUAMOUS CELL CARCINOMA EXCISION  02/04/2021   left leg    Prior to Admission medications   Medication Sig Start Date End Date Taking? Authorizing Provider  alendronate  (FOSAMAX ) 70 MG tablet Take 1 tablet (70 mg total) by mouth every 7 (seven) days. Take with a full glass of water on an empty stomach. 06/08/24  Yes Johnson, Megan P, DO  aspirin 81 MG tablet Take 81 mg by mouth daily.   Yes [provider]  calcium carbonate (OS-CAL) 600 MG TABS Take 600 mg by mouth daily with breakfast.    Yes [provider]  Cholecalciferol (VITAMIN D PO) Take 1 capsule by mouth daily.   Yes [provider]  cyclobenzaprine  (FLEXERIL ) 5 MG tablet TAKE 1-2 TABLETS (5-10 MG TOTAL) BY MOUTH AT BEDTIME.  03/23/24  Yes Melvin Pao, NP  fluticasone  (FLONASE ) 50 MCG/ACT nasal spray SPRAY 1 SPRAY INTO EACH NOSTRIL DAILY 09/22/23  Yes Johnson, Megan P, DO  lisinopril -hydrochlorothiazide  (ZESTORETIC ) 20-25 MG tablet Take 1 tablet by mouth daily. 03/18/24  Yes Johnson, Megan P, DO  loratadine (CLARITIN) 10 MG tablet Take 10 mg by mouth daily.   Yes [provider]  Omega-3 Fatty Acids (FISH OIL PO) Take by mouth in the morning and at bedtime.   Yes [provider]  oxybutynin  (DITROPAN ) 5 MG tablet Take 1 tablet (5 mg total) by mouth 2 (two) times daily. 03/18/24  Yes Johnson, Megan P, DO  pravastatin  (PRAVACHOL ) 20 MG tablet TAKE 1 TABLET BY MOUTH EVERYDAY AT BEDTIME 03/18/24  Yes Johnson, Megan P, DO  TURMERIC PO Take 1 tablet by mouth 2 (two) times daily.    Yes [provider]  docusate sodium (COLACE) 100 MG capsule Take 100 mg by mouth daily.     [provider]  mupirocin  ointment (BACTROBAN ) 2 % Apply 1 Application topically 2 (two) times daily. 09/22/23   Vicci Duwaine SQUIBB, DO    Allergies as of 03/28/2024   (No Known Allergies)    Family History  Problem Relation Age of Onset   Lung cancer Father    Stroke Mother    Hypertension Sister    Arthritis Sister    Dementia Sister  Breast cancer Neg Hx     Social History   Socioeconomic History   Marital status: Widowed    Spouse name: Not on file   Number of children: 1   Years of education: Not on file   Highest education level: 12th grade  Occupational History   Occupation: retired  Tobacco Use   Smoking status: Never    Passive exposure: Past   Smokeless tobacco: Never  Vaping Use   Vaping status: Never Used  Substance and Sexual Activity   Alcohol use: Yes    Comment: rarely drinks a beer   Drug use: No   Sexual activity: Not Currently  Other Topics Concern   Not on file  Social History Narrative   Not on file   Social Drivers of Health   Financial Resource Strain: Low Risk   (03/10/2024)   Overall Financial Resource Strain (CARDIA)    Difficulty of Paying Living Expenses: Not hard at all  Food Insecurity: No Food Insecurity (03/10/2024)   Hunger Vital Sign    Worried About Running Out of Food in the Last Year: Never true    Ran Out of Food in the Last Year: Never true  Transportation Needs: No Transportation Needs (03/10/2024)   PRAPARE - Administrator, Civil Service (Medical): No    Lack of Transportation (Non-Medical): No  Physical Activity: Insufficiently Active (03/10/2024)   Exercise Vital Sign    Days of Exercise per Week: 3 days    Minutes of Exercise per Session: 30 min  Stress: No Stress Concern Present (03/10/2024)   [employer] of Occupational Health - Occupational Stress Questionnaire    Feeling of Stress : Not at all  Social Connections: Moderately Isolated (03/10/2024)   Social Connection and Isolation Panel    Frequency of Communication with Friends and Family: More than three times a week    Frequency of Social Gatherings with Friends and Family: More than three times a week    Attends Religious Services: 1 to 4 times per year    Active Member of [employer] or Organizations: No    Attends Banker Meetings: Never    Marital Status: Widowed  Intimate Partner Violence: Not At Risk (03/10/2024)   Humiliation, Afraid, Rape, and Kick questionnaire    Fear of Current or Ex-Partner: No    Emotionally Abused: No    Physically Abused: No    Sexually Abused: No    Review of Systems: See HPI, otherwise negative ROS  Physical Exam: BP (!) 148/69   Pulse 86   Temp 97.8 F (36.6 C) (Temporal)   Resp 15   Ht 5' 5 (1.651 m)   Wt 111.4 kg   SpO2 95%   BMI 40.87 kg/m  General:   Alert, cooperative. Head:  Normocephalic and atraumatic. Respiratory:  Normal work of breathing. Cardiovascular:  NAD  Impression/Plan: Felicia Vargas is here for cataract surgery.  Risks, benefits, limitations, and alternatives regarding  cataract surgery have been reviewed with the patient.  Questions have been answered.  All parties agreeable.   Adine Novak, MD  06/13/2024, 12:44 PM

## 2024-06-13 NOTE — Op Note (Signed)
 OPERATIVE NOTE  Felicia Vargas [id number] 06/13/2024   PREOPERATIVE DIAGNOSIS:  Nuclear sclerotic cataract left eye.  H25.12   POSTOPERATIVE DIAGNOSIS:    Nuclear sclerotic cataract left eye.     PROCEDURE:  Phacoemusification with posterior chamber intraocular lens placement of the left eye   LENS:   Implant Name Type Inv. Item Serial No. Manufacturer Lot No. LRB No. Used Action  LENS IOL TECNIS EYHANCE 22.5 - [id number] Intraocular Lens LENS IOL TECNIS EYHANCE 22.5 [id number] [employer]IGHTPATH  Left 1 Implanted      Procedure(s): PHACOEMULSIFICATION, CATARACT, WITH IOL INSERTION 4.98, 00:33.7 (Left)  SURGEON:  Adine Novak, MD, MPH   ANESTHESIA:  Topical with tetracaine  drops augmented with 1% preservative-free intracameral lidocaine .  ESTIMATED BLOOD LOSS: <1 mL   COMPLICATIONS:  None.   DESCRIPTION OF PROCEDURE:  The patient was identified in the holding room and transported to the operating room and placed in the supine position under the operating microscope.  The left eye was identified as the operative eye and it was prepped and draped in the usual sterile ophthalmic fashion.   A 1.0 millimeter clear-corneal paracentesis was made at the 5:00 position. 0.5 ml of preservative-free 1% lidocaine  with epinephrine  was injected into the anterior chamber.  The anterior chamber was filled with viscoelastic.  A 2.4 millimeter keratome was used to make a near-clear corneal incision at the 2:00 position.  A curvilinear capsulorrhexis was made with a cystotome and capsulorrhexis forceps.  Balanced salt solution was used to hydrodissect and hydrodelineate the nucleus.   Phacoemulsification was then used in stop and chop fashion to remove the lens nucleus and epinucleus.  The remaining cortex was then removed using the irrigation and aspiration handpiece. Viscoelastic was then placed into the capsular bag to distend it for lens placement.  A lens was then injected into the capsular bag.  The  remaining viscoelastic was aspirated.   Wounds were hydrated with balanced salt solution.  The anterior chamber was inflated to a physiologic pressure with balanced salt solution.  Intracameral vigamox  0.1 mL undiltued was injected into the eye and a drop placed onto the ocular surface.  No wound leaks were noted.  The patient was taken to the recovery room in stable condition without complications of anesthesia or surgery  Adine Novak 06/13/2024, 1:11 PM

## 2024-06-13 NOTE — Transfer of Care (Signed)
 Immediate Anesthesia Transfer of Care Note  Patient: Felicia Vargas  Procedure(s) Performed: PHACOEMULSIFICATION, CATARACT, WITH IOL INSERTION 4.98, 00:33.7 (Left)  Patient Location: PACU  Anesthesia Type: MAC  Level of Consciousness: awake, alert  and patient cooperative  Airway and Oxygen Therapy: Patient Spontanous Breathing and Patient connected to supplemental oxygen  Post-op Assessment: Post-op Vital signs reviewed, Patient's Cardiovascular Status Stable, Respiratory Function Stable, Patent Airway and No signs of Nausea or vomiting  Post-op Vital Signs: Reviewed and stable  Complications: No notable events documented.

## 2024-06-13 NOTE — Anesthesia Postprocedure Evaluation (Signed)
 Anesthesia Post Note  Patient: IRELYNN SCHERMERHORN  Procedure(s) Performed: PHACOEMULSIFICATION, CATARACT, WITH IOL INSERTION 4.98, 00:33.7 (Left)  Patient location during evaluation: PACU Anesthesia Type: MAC Level of consciousness: awake and alert Pain management: pain level controlled Vital Signs Assessment: post-procedure vital signs reviewed and stable Respiratory status: spontaneous breathing, nonlabored ventilation, respiratory function stable and patient connected to nasal cannula oxygen Cardiovascular status: stable and blood pressure returned to baseline Postop Assessment: no apparent nausea or vomiting Anesthetic complications: no   No notable events documented.   Last Vitals:  Vitals:   06/13/24 1315 06/13/24 1318  BP: 123/73 128/73  Pulse: 62 64  Resp: 16 17  Temp:  (!) 36.2 C  SpO2: 100% 100%    Last Pain:  Vitals:   06/13/24 1318  TempSrc:   PainSc: 0-No pain                 Lochlyn Zullo C Ceyda Peterka

## 2024-06-13 NOTE — Anesthesia Preprocedure Evaluation (Signed)
 Anesthesia Evaluation  Patient identified by MRN, date of birth, ID band Patient awake    Reviewed: Allergy & Precautions, H&P , NPO status , Patient's Chart, lab work & pertinent test results  Airway Mallampati: III  TM Distance: >3 FB     Dental  (+) Edentulous Upper  Edentulous Upper Has two remaining lower teeth, these are not loose, did not put her dentures in place:  :   Pulmonary neg pulmonary ROS          Cardiovascular hypertension, + Peripheral Vascular Disease  negative cardio ROS      Neuro/Psych  Headaches negative neurological ROS  negative psych ROS   GI/Hepatic negative GI ROS, Neg liver ROS,,,  Endo/Other  negative endocrine ROS    Renal/GU negative Renal ROS  negative genitourinary   Musculoskeletal negative musculoskeletal ROS (+) Arthritis ,    Abdominal   Peds negative pediatric ROS (+)  Hematology negative hematology ROS (+)   Anesthesia Other Findings Hypertension  Diffuse cystic mastopathy Migraines  High cholesterol Allergy Overactive bladder Arthritis  Migraine Urinary incontinence  Cancer (HCC) Morbid obesity (HCC)  Previous cataract surgery 05-16-24   Reproductive/Obstetrics negative OB ROS                              Anesthesia Physical Anesthesia Plan  ASA: 2  Anesthesia Plan: MAC   Post-op Pain Management:    Induction: Intravenous  PONV Risk Score and Plan:   Airway Management Planned: Natural Airway and Nasal Cannula  Additional Equipment:   Intra-op Plan:   Post-operative Plan:   Informed Consent: I have reviewed the patients History and Physical, chart, labs and discussed the procedure including the risks, benefits and alternatives for the proposed anesthesia with the patient or authorized representative who has indicated his/her understanding and acceptance.     Dental Advisory Given  Plan Discussed with:  Anesthesiologist, CRNA and Surgeon  Anesthesia Plan Comments: (Patient consented for risks of anesthesia including but not limited to:  - adverse reactions to medications - damage to eyes, teeth, lips or other oral mucosa - nerve damage due to positioning  - sore throat or hoarseness - Damage to heart, brain, nerves, lungs, other parts of body or loss of life  Patient voiced understanding and assent.)         Anesthesia Quick Evaluation

## 2024-06-21 ENCOUNTER — Telehealth: Payer: Self-pay

## 2024-06-21 NOTE — Telephone Encounter (Signed)
 Copied from CRM #[id number]. Topic: Clinical - Lab/Test Results >> Jun 21, 2024  9:50 AM Sophia H wrote: Reason for CRM: Patient checking in on lab results, states she had a medication prescribed and she was unsure of why or if she should be taking it. Medication :alendronate  (FOSAMAX ) 70 MG tablet  Advised per chart your bone density in your hip did dip over into the osteoporosis level. I'm going to send you in some fosamax  and we'll recheck it in 2 years. Let me know if you have any questions. Have a great day!  Take 1 tablet (70 mg total) by mouth every 7 (seven) days. Take with a full glass of water on an empty stomach.  Patient wanting to know what medication does: Microbiologist Fosamax  (Alendronate ) 70 mg is used primarily to treat osteoporosis and increase bone mass in individuals at risk of fractures.  **For provider : Patient states she did not know about starting the medication till today 08/05 when she went to the pharmacy to pick up her other medications. She will call back if she has any side effects or needing refills on any of her medications. Ty

## 2024-06-21 NOTE — Telephone Encounter (Signed)
 Sending to provider CMA for assistance.

## 2024-06-25 ENCOUNTER — Other Ambulatory Visit: Payer: Self-pay | Admitting: Nurse Practitioner

## 2024-06-28 NOTE — Telephone Encounter (Signed)
 Requested medication (s) are due for refill today: yes  Requested medication (s) are on the active medication list: yes  Last refill:  03/23/24  Future visit scheduled: no  Notes to clinic: Unable to refill per protocol, cannot delegate.       Requested Prescriptions  Pending Prescriptions Disp Refills   cyclobenzaprine  (FLEXERIL ) 5 MG tablet [Pharmacy Med Name: CYCLOBENZAPRINE  5 MG TABLET] 180 tablet 0    Sig: TAKE 1-2 TABLETS (5-10 MG TOTAL) BY MOUTH AT BEDTIME.     Not Delegated - Analgesics:  Muscle Relaxants Failed - 06/28/2024  4:19 PM      Failed - This refill cannot be delegated      Passed - Valid encounter within last 6 months    Recent Outpatient Visits           3 months ago Primary hypertension   [facility] [place], [place], DO

## 2024-07-15 DIAGNOSIS — Z96652 Presence of left artificial knee joint: Secondary | ICD-10-CM | POA: Diagnosis not present

## 2024-07-15 DIAGNOSIS — Z96651 Presence of right artificial knee joint: Secondary | ICD-10-CM | POA: Diagnosis not present

## 2024-08-19 ENCOUNTER — Encounter: Payer: Self-pay | Admitting: Pediatrics

## 2024-08-19 ENCOUNTER — Ambulatory Visit (INDEPENDENT_AMBULATORY_CARE_PROVIDER_SITE_OTHER): Admitting: Pediatrics

## 2024-08-19 VITALS — BP 130/77 | HR 69 | Temp 97.9°F | Wt 246.6 lb

## 2024-08-19 DIAGNOSIS — L0390 Cellulitis, unspecified: Secondary | ICD-10-CM | POA: Diagnosis not present

## 2024-08-19 DIAGNOSIS — M810 Age-related osteoporosis without current pathological fracture: Secondary | ICD-10-CM

## 2024-08-19 DIAGNOSIS — R110 Nausea: Secondary | ICD-10-CM | POA: Diagnosis not present

## 2024-08-19 MED ORDER — DOXYCYCLINE HYCLATE 100 MG PO TABS: 100.0000 mg | ORAL_TABLET | Freq: Two times a day (BID) | ORAL | 0 refills | Status: AC

## 2024-08-19 MED ORDER — ONDANSETRON 4 MG PO TBDP: 4.0000 mg | ORAL_TABLET | Freq: Three times a day (TID) | ORAL | 0 refills | Status: AC | PRN

## 2024-08-19 NOTE — Progress Notes (Signed)
 Office Visit  BP 130/77   Pulse 69   Temp 97.9 F (36.6 C) (Oral)   Wt 246 lb 9.6 oz (111.9 kg)   SpO2 98%   BMI 41.04 kg/m    Subjective:    Patient ID: Felicia Vargas, female    DOB: Aug 03, 1946, 78 y.o.   MRN: [id number]  HPI: Felicia Vargas is a 78 y.o. female  Chief Complaint  Patient presents with   Medication Reaction    Pt thinks Alendronate  is causing the blisters and making her tongue feels numb    Discussed the use of AI scribe software for clinical note transcription with the patient, who gave verbal consent to proceed.  History of Present Illness   Felicia Vargas is a 78 year old female who presents with blistering skin rashes after starting a new medication for osteoporosis.  She developed blistering skin rashes approximately two months after starting a new medication for osteoporosis on June 10, 2024. The initial blister appeared as a small lesion that burst, followed by similar blisters on her back, buttocks, and leg. The blisters tend to appear a couple of days after taking the medication, with the most recent occurrence on Wednesday of this week.  The blisters are initially small, with some developing into larger lesions that occasionally release pus. She has been applying cold compresses to manage the redness and heat around the blisters. The blisters are not particularly painful but are concerning due to their appearance and persistence.  In addition to the skin symptoms, she experiences numbness of the tongue and a funny sensation in her lips over the past two weeks. No nausea, but her lips feel dry and her chin has been itching since starting the medication. She has been using over-the-counter hydrocortisone cream for the itching.  Her current medications include the osteoporosis medication taken weekly on Mondays, with no other recent changes to her medication regimen. She denies any known drug allergies and has not experienced any gastrointestinal  symptoms such as constipation, diarrhea, or acid reflux.  She has a history of shingles but notes that the current rash does not resemble her previous shingles experience. She has not spent significant time outdoors recently.        Relevant past medical, surgical, family and social history reviewed and updated as indicated. Interim medical history since our last visit reviewed. Allergies and medications reviewed and updated.  ROS per HPI unless specifically indicated above     Objective:    BP 130/77   Pulse 69   Temp 97.9 F (36.6 C) (Oral)   Wt 246 lb 9.6 oz (111.9 kg)   SpO2 98%   BMI 41.04 kg/m   Wt Readings from Last 3 Encounters:  08/19/24 246 lb 9.6 oz (111.9 kg)  06/13/24 245 lb 9.6 oz (111.4 kg)  05/16/24 247 lb (112 kg)     Physical Exam Constitutional:      Appearance: Normal appearance.  Pulmonary:     Effort: Pulmonary effort is normal.  Musculoskeletal:        General: Normal range of motion.  Skin:    Findings: Rash present.     Comments: See images below.  Neurological:     General: No focal deficit present.     Mental Status: She is alert. Mental status is at baseline.  Psychiatric:        Mood and Affect: Mood normal.        Behavior: Behavior normal.  Thought Content: Thought content normal.             08/19/2024    2:29 PM 03/18/2024    9:25 AM 03/10/2024    9:31 AM 03/17/2023    9:13 AM 03/02/2023   10:07 AM  Depression screen PHQ 2/9  Decreased Interest 0 0 0 0 0  Down, Depressed, Hopeless 0 0 0 0 0  PHQ - 2 Score 0 0 0 0 0  Altered sleeping 0 0 1 0 0  Tired, decreased energy 0 0 0 0 0  Change in appetite 0 0 0 0 0  Feeling bad or failure about yourself  0 0 0 0 0  Trouble concentrating 0 0 0 0 0  Moving slowly or fidgety/restless 0 0 0 0 0  Suicidal thoughts 0 0 0 0 0  PHQ-9 Score 0 0 1 0 0  Difficult doing work/chores Not difficult at all Not difficult at all Not difficult at all Not difficult at all Not difficult at  all       08/19/2024    2:30 PM 03/18/2024    9:25 AM 03/17/2023    9:14 AM 09/16/2022    9:26 AM  GAD 7 : Generalized Anxiety Score  Nervous, Anxious, on Edge 0 0 0 0  Control/stop worrying 0 0 0 0  Worry too much - different things 0 0 0 0  Trouble relaxing 0 0 0 0  Restless 0 0 0 1  Easily annoyed or irritable 0 0 0 1  Afraid - awful might happen 0 0 0 0  Total GAD 7 Score 0 0 0 2  Anxiety Difficulty Not difficult at all Not difficult at all Not difficult at all Not difficult at all       Assessment & Plan:  Assessment & Plan   Cellulitis, unspecified cellulitis site Nausea Blistering likely due to medication, with secondary cellulitis suspected. Numbness of tongue and lips noted but improving. Allergic reaction suspected given timing of sx coincide w initiation of fosamax . Medication held to confirm allergy and help w resolution. - Hold medication until f/u - Prescribe doxycycline 100 mg twice daily for five days. - Advise taking doxycycline with food. - Prescribe Zofran ODT for nausea as needed. - Apply OTC hydrocortisone cream to itchy areas. - Monitor for new blisters or worsening condition, call if symptoms persist. - Document blisters with photographs for follow-up. -     Doxycycline Hyclate; Take 1 tablet (100 mg total) by mouth 2 (two) times daily for 5 days.  Dispense: 10 tablet; Refill: 0 -     Ondansetron; Take 1 tablet (4 mg total) by mouth every 8 (eight) hours as needed for nausea or vomiting.  Dispense: 20 tablet; Refill: 0   Osteoporosis Osteoporosis management affected by medication hold due to suspected allergy. - Discuss alternative treatments with Doctor Vicci, including annual injections.    Follow up plan: No follow-ups on file.  Hadassah SHAUNNA Nett, MD

## 2024-08-19 NOTE — Patient Instructions (Addendum)
 For your rash:  Start docycyline 100mg  daily  For nausea: Zofran as needed

## 2024-08-22 ENCOUNTER — Encounter: Payer: Self-pay | Admitting: Pediatrics

## 2024-08-25 ENCOUNTER — Ambulatory Visit: Admitting: Nurse Practitioner

## 2024-09-14 ENCOUNTER — Other Ambulatory Visit: Payer: Self-pay | Admitting: Family Medicine

## 2024-09-15 NOTE — Telephone Encounter (Signed)
 Requested Prescriptions  Pending Prescriptions Disp Refills   oxybutynin  (DITROPAN ) 5 MG tablet [Pharmacy Med Name: OXYBUTYNIN  5 MG TABLET] 180 tablet 0    Sig: TAKE 1 TABLET BY MOUTH TWICE A DAY     Urology:  Bladder Agents Passed - 09/15/2024  1:46 PM      Passed - Valid encounter within last 12 months    Recent Outpatient Visits           3 weeks ago Cellulitis, unspecified cellulitis site   [facility] [place] Hadassah SQUIBB, MD   6 months ago Primary hypertension   [facility] [place], [place], [place]

## 2024-09-22 ENCOUNTER — Encounter: Admitting: Family Medicine

## 2024-09-27 ENCOUNTER — Ambulatory Visit (INDEPENDENT_AMBULATORY_CARE_PROVIDER_SITE_OTHER): Admitting: Family Medicine

## 2024-09-27 ENCOUNTER — Encounter: Payer: Self-pay | Admitting: Family Medicine

## 2024-09-27 VITALS — BP 129/62 | HR 71 | Temp 97.9°F | Ht 65.0 in | Wt 250.0 lb

## 2024-09-27 DIAGNOSIS — D692 Other nonthrombocytopenic purpura: Secondary | ICD-10-CM | POA: Diagnosis not present

## 2024-09-27 DIAGNOSIS — E7800 Pure hypercholesterolemia, unspecified: Secondary | ICD-10-CM | POA: Diagnosis not present

## 2024-09-27 DIAGNOSIS — I10 Essential (primary) hypertension: Secondary | ICD-10-CM | POA: Diagnosis not present

## 2024-09-27 DIAGNOSIS — Z0000 Encounter for general adult medical examination without abnormal findings: Secondary | ICD-10-CM

## 2024-09-27 DIAGNOSIS — M810 Age-related osteoporosis without current pathological fracture: Secondary | ICD-10-CM | POA: Diagnosis not present

## 2024-09-27 DIAGNOSIS — Z23 Encounter for immunization: Secondary | ICD-10-CM | POA: Diagnosis not present

## 2024-09-27 LAB — MICROALBUMIN, URINE WAIVED
Creatinine, Urine Waived: 10 mg/dL (ref 10–300)
Microalb, Ur Waived: 10 mg/L (ref 0–19)

## 2024-09-27 MED ORDER — CYCLOBENZAPRINE HCL 5 MG PO TABS: 5.0000 mg | ORAL_TABLET | Freq: Every day | ORAL | 1 refills | Status: AC

## 2024-09-27 MED ORDER — PRAVASTATIN SODIUM 20 MG PO TABS: ORAL_TABLET | ORAL | 1 refills | Status: AC

## 2024-09-27 MED ORDER — IBANDRONATE SODIUM 150 MG PO TABS: 150.0000 mg | ORAL_TABLET | ORAL | 3 refills | Status: AC

## 2024-09-27 MED ORDER — LISINOPRIL-HYDROCHLOROTHIAZIDE 20-25 MG PO TABS
1.0000 | ORAL_TABLET | Freq: Every day | ORAL | 1 refills | Status: AC
Start: 2024-09-27 — End: ?

## 2024-09-27 MED ORDER — OXYBUTYNIN CHLORIDE 5 MG PO TABS: 5.0000 mg | ORAL_TABLET | Freq: Two times a day (BID) | ORAL | 1 refills | Status: DC

## 2024-09-27 NOTE — Assessment & Plan Note (Signed)
 Reassured patient. Continue to monitor.

## 2024-09-27 NOTE — Assessment & Plan Note (Signed)
 Rash on fosamax . Will change her to boniva and recheck in 2 months. Call with any concerns.

## 2024-09-27 NOTE — Assessment & Plan Note (Signed)
 Under good control on current regimen. Continue current regimen. Continue to monitor. Call with any concerns. Refills given. Labs drawn today.

## 2024-09-27 NOTE — Patient Instructions (Addendum)
 Biotin

## 2024-09-27 NOTE — Progress Notes (Signed)
 BP 129/62   Pulse 71   Temp 97.9 F (36.6 C) (Oral)   Ht 5' 5 (1.651 m)   Wt 250 lb (113.4 kg)   SpO2 97%   BMI 41.60 kg/m    Subjective:    Patient ID: Felicia Vargas, female    DOB: 22-Dec-1945, 78 y.o.   MRN: [id number]  HPI: Felicia Vargas is a 77 y.o. female presenting on 09/27/2024 for comprehensive medical examination. Current medical complaints include:  HYPERTENSION / HYPERLIPIDEMIA Satisfied with current treatment? yes Duration of hypertension: chronic BP monitoring frequency: not checking BP medication side effects: no Past BP meds: lisinopril , HCTZ Duration of hyperlipidemia: chronic Cholesterol medication side effects: no Cholesterol supplements: fish oil Past cholesterol medications: pravastatin  Medication compliance: excellent compliance Aspirin: yes Recent stressors: no Recurrent headaches: no Visual changes: no Palpitations: no Dyspnea: no Chest pain: no Lower extremity edema: no Dizzy/lightheaded: no   She currently lives with: daughter Menopausal Symptoms: no  Depression Screen done today and results listed below:     09/27/2024   10:40 AM 08/19/2024    2:29 PM 03/18/2024    9:25 AM 03/10/2024    9:31 AM 03/17/2023    9:13 AM  Depression screen PHQ 2/9  Decreased Interest 0 0 0 0 0  Down, Depressed, Hopeless 0 0 0 0 0  PHQ - 2 Score 0 0 0 0 0  Altered sleeping 0 0 0 1 0  Tired, decreased energy 0 0 0 0 0  Change in appetite 0 0 0 0 0  Feeling bad or failure about yourself  0 0 0 0 0  Trouble concentrating 0 0 0 0 0  Moving slowly or fidgety/restless 0 0 0 0 0  Suicidal thoughts 0 0 0 0 0  PHQ-9 Score 0 0  0  1  0   Difficult doing work/chores Not difficult at all Not difficult at all Not difficult at all Not difficult at all Not difficult at all     Data saved with a previous flowsheet row definition    Past Medical History:  Past Medical History:  Diagnosis Date   Allergy    Arthritis    Cancer (HCC)    skin   Diffuse cystic  mastopathy 2013   High cholesterol    Hypertension 1990's   Migraine    Migraines    Morbid obesity (HCC)    Overactive bladder    Urinary incontinence     Surgical History:  Past Surgical History:  Procedure Laterality Date   BREAST BIOPSY Right 05/31/1993   neg/lump   CATARACT EXTRACTION W/PHACO Right 05/16/2024   Procedure: PHACOEMULSIFICATION, CATARACT, WITH IOL INSERTION 3.29, 00:27.9;  Surgeon: Myrna Adine Anes, MD;  Location: [facility];  Service: Ophthalmology;  Laterality: Right;   CATARACT EXTRACTION W/PHACO Left 06/13/2024   Procedure: PHACOEMULSIFICATION, CATARACT, WITH IOL INSERTION 4.98, 00:33.7;  Surgeon: Myrna Adine Anes, MD;  Location: [facility];  Service: Ophthalmology;  Laterality: Left;   COLONOSCOPY  11/2004   Dr. 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   calcium carbonate (OS-CAL) 600 MG TABS Take 600 mg by mouth daily with breakfast.    Cholecalciferol (VITAMIN D PO) Take 1 capsule by mouth daily.   docusate sodium (COLACE) 100 MG capsule Take 100 mg by mouth daily.    fluticasone  (FLONASE ) 50 MCG/ACT nasal spray SPRAY 1 SPRAY INTO EACH NOSTRIL DAILY   loratadine (CLARITIN) 10 MG tablet Take 10 mg by mouth daily.   mupirocin  ointment (BACTROBAN ) 2 % Apply 1 Application topically 2 (two) times daily.   Omega-3 Fatty Acids (FISH OIL PO) Take by mouth in the morning and at bedtime.   ondansetron (ZOFRAN-ODT) 4 MG disintegrating tablet  Take 1 tablet (4 mg total) by mouth every 8 (eight) hours as needed for nausea or vomiting.   TURMERIC PO Take 1 tablet by mouth 2 (two) times daily.    No current facility-administered medications on file prior to visit.    Allergies:  Allergies  Allergen Reactions   Fosamax  [Alendronate ] Rash    Social History:  Social History   Socioeconomic History   Marital status: Widowed    Spouse name: Not on file   Number of children: 1   Years of education: Not on file   Highest education level: 12th grade  Occupational History   Occupation: retired  Tobacco Use   Smoking status: Never    Passive exposure: Past   Smokeless tobacco: Never  Vaping Use   Vaping status: Never Used  Substance and Sexual Activity   Alcohol use: Yes    Comment: rarely drinks a beer   Drug use: No   Sexual activity: Not Currently  Other Topics Concern   Not on file  Social History Narrative   Not on file   Social Drivers of Health   Financial Resource Strain: Low Risk  (09/26/2024)   Overall Financial Resource Strain (CARDIA)    Difficulty of Paying Living Expenses: Not very hard  Food Insecurity: Unknown (09/26/2024)   Hunger Vital Sign    Worried About Running Out of Food in the Last Year: Never true    Ran Out of Food in the Last Year: Patient declined  Transportation Needs: No Transportation Needs (09/26/2024)   PRAPARE - Administrator, Civil Service (Medical): No    Lack of Transportation (Non-Medical): No  Physical Activity: Inactive (09/26/2024)   Exercise Vital Sign    Days of Exercise per Week: 0 days    Minutes of Exercise per Session: Not on file  Stress: No Stress Concern Present (09/26/2024)   [employer] of Occupational Health - Occupational Stress Questionnaire    Feeling of Stress: Only a little  Social Connections: Moderately Isolated (09/26/2024)   Social Connection and Isolation Panel    Frequency of Communication with Friends and Family: More than  three times a week    Frequency of Social Gatherings with Friends and Family: More than three times a week    Attends Religious Services: More than 4 times per year    Active Member of [employer] or Organizations: No    Attends Banker Meetings: Not on file    Marital Status: Widowed  Intimate Partner Violence: Not At Risk (03/10/2024)   Humiliation, Afraid, Rape, and Kick questionnaire    Fear of Current or Ex-Partner: No    Emotionally Abused: No    Physically Abused: No  Sexually Abused: No   Social History   Tobacco Use  Smoking Status Never   Passive exposure: Past  Smokeless Tobacco Never   Social History   Substance and Sexual Activity  Alcohol Use Yes   Comment: rarely drinks a beer    Family History:  Family History  Problem Relation Age of Onset   Lung cancer Father    Stroke Mother    Hypertension Sister    Arthritis Sister    Dementia Sister    Breast cancer Neg Hx     Past medical history, surgical history, medications, allergies, family history and social history reviewed with patient today and changes made to appropriate areas of the chart.   Review of Systems  Constitutional: Negative.   HENT: Negative.    Eyes: Negative.   Respiratory: Negative.    Cardiovascular:  Positive for leg swelling. Negative for chest pain, palpitations, orthopnea, claudication and PND.  Gastrointestinal:  Positive for heartburn. Negative for abdominal pain, blood in stool, constipation, diarrhea, melena, nausea and vomiting.  Genitourinary: Negative.   Musculoskeletal: Negative.   Skin: Negative.   Neurological:  Positive for tingling. Negative for dizziness, tremors, sensory change, speech change, focal weakness, seizures, loss of consciousness, weakness and headaches.  Endo/Heme/Allergies:  Positive for polydipsia. Negative for environmental allergies. Bruises/bleeds easily.  Psychiatric/Behavioral: Negative.     All other ROS negative except what is listed  above and in the HPI.      Objective:    BP 129/62   Pulse 71   Temp 97.9 F (36.6 C) (Oral)   Ht 5' 5 (1.651 m)   Wt 250 lb (113.4 kg)   SpO2 97%   BMI 41.60 kg/m   Wt Readings from Last 3 Encounters:  09/27/24 250 lb (113.4 kg)  08/19/24 246 lb 9.6 oz (111.9 kg)  06/13/24 245 lb 9.6 oz (111.4 kg)    Physical Exam Vitals and nursing note reviewed.  Constitutional:      General: She is not in acute distress.    Appearance: Normal appearance. She is obese. She is not ill-appearing, toxic-appearing or diaphoretic.  HENT:     Head: Normocephalic and atraumatic.     Right Ear: Tympanic membrane, ear canal and external ear normal. There is no impacted cerumen.     Left Ear: Tympanic membrane, ear canal and external ear normal. There is no impacted cerumen.     Nose: Nose normal. No congestion or rhinorrhea.     Mouth/Throat:     Mouth: Mucous membranes are moist.     Pharynx: Oropharynx is clear. No oropharyngeal exudate or posterior oropharyngeal erythema.  Eyes:     General: No scleral icterus.       Right eye: No discharge.        Left eye: No discharge.     Extraocular Movements: Extraocular movements intact.     Conjunctiva/sclera: Conjunctivae normal.     Pupils: Pupils are equal, round, and reactive to light.  Neck:     Vascular: No carotid bruit.  Cardiovascular:     Rate and Rhythm: Normal rate and regular rhythm.     Pulses: Normal pulses.     Heart sounds: No murmur heard.    No friction rub. No gallop.  Pulmonary:     Effort: Pulmonary effort is normal. No respiratory distress.     Breath sounds: Normal breath sounds. No stridor. No wheezing, rhonchi or rales.  Chest:     Chest wall: No tenderness.  Abdominal:  General: Abdomen is flat. Bowel sounds are normal. There is no distension.     Palpations: Abdomen is soft. There is no mass.     Tenderness: There is no abdominal tenderness. There is no right CVA tenderness, left CVA tenderness, guarding or  rebound.     Hernia: No hernia is present.  Genitourinary:    Comments: Breast and pelvic exams deferred with shared decision making Musculoskeletal:        General: No swelling, tenderness, deformity or signs of injury.     Cervical back: Normal range of motion and neck supple. No rigidity. No muscular tenderness.     Right lower leg: No edema.     Left lower leg: No edema.  Lymphadenopathy:     Cervical: No cervical adenopathy.  Skin:    General: Skin is warm and dry.     Capillary Refill: Capillary refill takes less than 2 seconds.     Coloration: Skin is not jaundiced or pale.     Findings: No bruising, erythema, lesion or rash.  Neurological:     General: No focal deficit present.     Mental Status: She is alert and oriented to person, place, and time. Mental status is at baseline.     Cranial Nerves: No cranial nerve deficit.     Sensory: No sensory deficit.     Motor: No weakness.     Coordination: Coordination normal.     Gait: Gait normal.     Deep Tendon Reflexes: Reflexes normal.  Psychiatric:        Mood and Affect: Mood normal.        Behavior: Behavior normal.        Thought Content: Thought content normal.        Judgment: Judgment normal.     Results for orders placed or performed in visit on 04/13/24  Basic metabolic panel with GFR   Collection Time: 04/13/24  8:48 AM  Result Value Ref Range   Glucose 92 70 - 99 mg/dL   BUN 27 8 - 27 mg/dL   Creatinine, Ser 8.98 (H) 0.57 - 1.00 mg/dL   eGFR 58 (L) >40 fO/fpw/8.26   BUN/Creatinine Ratio 27 12 - 28   Sodium 139 134 - 144 mmol/L   Potassium 3.8 3.5 - 5.2 mmol/L   Chloride 103 96 - 106 mmol/L   CO2 20 20 - 29 mmol/L   Calcium 9.7 8.7 - 10.3 mg/dL      Assessment & Plan:   Problem List Items Addressed This Visit       Cardiovascular and Mediastinum   Hypertension   Under good control on current regimen. Continue current regimen. Continue to monitor. Call with any concerns. Refills given. Labs drawn  today.        Relevant Medications   lisinopril -hydrochlorothiazide  (ZESTORETIC ) 20-25 MG tablet   pravastatin  (PRAVACHOL ) 20 MG tablet   Other Relevant Orders   Comprehensive metabolic panel with GFR   TSH   Microalbumin, Urine Waived   Senile purpura   Reassured patient. Continue to monitor.       Relevant Medications   lisinopril -hydrochlorothiazide  (ZESTORETIC ) 20-25 MG tablet   pravastatin  (PRAVACHOL ) 20 MG tablet   Other Relevant Orders   CBC with Differential/Platelet     Musculoskeletal and Integument   Osteoporosis   Rash on fosamax . Will change her to boniva and recheck in 2 months. Call with any concerns.       Relevant Medications   ibandronate (BONIVA) 150  MG tablet     Other   High cholesterol   Under good control on current regimen. Continue current regimen. Continue to monitor. Call with any concerns. Refills given. Labs drawn today.       Relevant Medications   lisinopril -hydrochlorothiazide  (ZESTORETIC ) 20-25 MG tablet   pravastatin  (PRAVACHOL ) 20 MG tablet   Other Relevant Orders   Comprehensive metabolic panel with GFR   Lipid Panel w/o Chol/HDL Ratio   Other Visit Diagnoses       Routine general medical examination at a health care facility    -  Primary   Vaccines up to date. Screening labs checked today. DEXA and Mammo up to date. Continue diet and exercise. Call with any concerns.     Needs flu shot       Flu shot given today.        Follow up plan: Return in about 2 months (around 11/27/2024).   LABORATORY TESTING:  - Pap smear: not applicable  IMMUNIZATIONS:   - Tdap: Tetanus vaccination status reviewed: last tetanus booster within 10 years. - Influenza: Administered today - Pneumovax: Up to date - Prevnar: Up to date - COVID: Refused - HPV: Not applicable - Shingrix vaccine: Refused  SCREENING: -Mammogram: Up to date  - Colonoscopy: Not applicable  - Bone Density: Up to date    PATIENT COUNSELING:   Advised to take 1  mg of folate supplement per day if capable of pregnancy.   Sexuality: Discussed sexually transmitted diseases, partner selection, use of condoms, avoidance of unintended pregnancy  and contraceptive alternatives.   Advised to avoid cigarette smoking.  I discussed with the patient that most people either abstain from alcohol or drink within safe limits (<=14/week and <=4 drinks/occasion for males, <=7/weeks and <= 3 drinks/occasion for females) and that the risk for alcohol disorders and other health effects rises proportionally with the number of drinks per week and how often a drinker exceeds daily limits.  Discussed cessation/primary prevention of drug use and availability of treatment for abuse.   Diet: Encouraged to adjust caloric intake to maintain  or achieve ideal body weight, to reduce intake of dietary saturated fat and total fat, to limit sodium intake by avoiding high sodium foods and not adding table salt, and to maintain adequate dietary potassium and calcium preferably from fresh fruits, vegetables, and low-fat dairy products.    stressed the importance of regular exercise  Injury prevention: Discussed safety belts, safety helmets, smoke detector, smoking near bedding or upholstery.   Dental health: Discussed importance of regular tooth brushing, flossing, and dental visits.    NEXT PREVENTATIVE PHYSICAL DUE IN 1 YEAR. Return in about 2 months (around 11/27/2024).

## 2024-09-28 ENCOUNTER — Ambulatory Visit: Payer: Self-pay | Admitting: Family Medicine

## 2024-09-28 LAB — COMPREHENSIVE METABOLIC PANEL WITH GFR
ALT: 19 IU/L (ref 0–32)
AST: 22 IU/L (ref 0–40)
Albumin: 3.8 g/dL (ref 3.8–4.8)
Alkaline Phosphatase: 86 IU/L (ref 49–135)
BUN/Creatinine Ratio: 24 (ref 12–28)
BUN: 26 mg/dL (ref 8–27)
Bilirubin Total: 0.7 mg/dL (ref 0.0–1.2)
CO2: 22 mmol/L (ref 20–29)
Calcium: 9.4 mg/dL (ref 8.7–10.3)
Chloride: 106 mmol/L (ref 96–106)
Creatinine, Ser: 1.08 mg/dL — ABNORMAL HIGH (ref 0.57–1.00)
Globulin, Total: 2.6 g/dL (ref 1.5–4.5)
Glucose: 107 mg/dL — ABNORMAL HIGH (ref 70–99)
Potassium: 4.1 mmol/L (ref 3.5–5.2)
Sodium: 142 mmol/L (ref 134–144)
Total Protein: 6.4 g/dL (ref 6.0–8.5)
eGFR: 53 mL/min/1.73 — ABNORMAL LOW (ref >59)

## 2024-09-28 LAB — CBC WITH DIFFERENTIAL/PLATELET
Basophils Absolute: 0.1 x10E3/uL (ref 0.0–0.2)
Basos: 2 %
EOS (ABSOLUTE): 0.2 x10E3/uL (ref 0.0–0.4)
Eos: 4 %
Hematocrit: 34.3 % (ref 34.0–46.6)
Hemoglobin: 11.5 g/dL (ref 11.1–15.9)
Immature Grans (Abs): 0 x10E3/uL (ref 0.0–0.1)
Immature Granulocytes: 0 %
Lymphocytes Absolute: 1.3 x10E3/uL (ref 0.7–3.1)
Lymphs: 26 %
MCH: 33.7 pg — ABNORMAL HIGH (ref 26.6–33.0)
MCHC: 33.5 g/dL (ref 31.5–35.7)
MCV: 101 fL — ABNORMAL HIGH (ref 79–97)
Monocytes Absolute: 0.4 x10E3/uL (ref 0.1–0.9)
Monocytes: 8 %
Neutrophils Absolute: 2.9 x10E3/uL (ref 1.4–7.0)
Neutrophils: 60 %
Platelets: 224 x10E3/uL (ref 150–450)
RBC: 3.41 x10E6/uL — ABNORMAL LOW (ref 3.77–5.28)
RDW: 11.3 % — ABNORMAL LOW (ref 11.7–15.4)
WBC: 4.8 x10E3/uL (ref 3.4–10.8)

## 2024-09-28 LAB — LIPID PANEL W/O CHOL/HDL RATIO
Cholesterol, Total: 153 mg/dL (ref 100–199)
HDL: 51 mg/dL (ref >39)
LDL Chol Calc (NIH): 84 mg/dL (ref 0–99)
Triglycerides: 96 mg/dL (ref 0–149)
VLDL Cholesterol Cal: 18 mg/dL (ref 5–40)

## 2024-09-28 LAB — TSH: TSH: 2.1 u[IU]/mL (ref 0.450–4.500)

## 2024-11-28 ENCOUNTER — Encounter: Payer: Self-pay | Admitting: Family Medicine

## 2024-11-28 ENCOUNTER — Ambulatory Visit (INDEPENDENT_AMBULATORY_CARE_PROVIDER_SITE_OTHER): Admitting: Family Medicine

## 2024-11-28 VITALS — BP 131/66 | HR 61 | Temp 97.3°F | Ht 65.0 in | Wt 249.8 lb

## 2024-11-28 DIAGNOSIS — M810 Age-related osteoporosis without current pathological fracture: Secondary | ICD-10-CM

## 2024-11-28 DIAGNOSIS — N3281 Overactive bladder: Secondary | ICD-10-CM

## 2024-11-28 DIAGNOSIS — R350 Frequency of micturition: Secondary | ICD-10-CM

## 2024-11-28 LAB — URINALYSIS, ROUTINE W REFLEX MICROSCOPIC
Bilirubin, UA: NEGATIVE
Glucose, UA: NEGATIVE
Ketones, UA: NEGATIVE
Leukocytes,UA: NEGATIVE
Nitrite, UA: NEGATIVE
Protein,UA: NEGATIVE
Specific Gravity, UA: 1.01 (ref 1.005–1.030)
Urobilinogen, Ur: 0.2 mg/dL (ref 0.2–1.0)
pH, UA: 6 (ref 5.0–7.5)

## 2024-11-28 LAB — MICROSCOPIC EXAMINATION
Bacteria, UA: NONE SEEN
Epithelial Cells (non renal): NONE SEEN /HPF (ref 0–10)
RBC, Urine: NONE SEEN /HPF (ref 0–2)
WBC, UA: NONE SEEN /HPF (ref 0–5)

## 2024-11-28 MED ORDER — OXYBUTYNIN CHLORIDE ER 10 MG PO TB24: 10.0000 mg | ORAL_TABLET | Freq: Every day | ORAL | 2 refills | Status: AC

## 2024-11-28 NOTE — Progress Notes (Signed)
 "  BP 131/66   Pulse 61   Temp (!) 97.3 F (36.3 C) (Oral)   Ht 5' 5 (1.651 m)   Wt 249 lb 12.8 oz (113.3 kg)   SpO2 99%   BMI 41.57 kg/m    Subjective:    Patient ID: Felicia Vargas, female    DOB: Dec 12, 1945, 79 y.o.   MRN: [id number]  HPI: Felicia Vargas is a 79 y.o. female  Chief Complaint  Patient presents with   Osteoporosis    Stopped the Boniva  one month after starting. Had blisters, diarrhea, numbness in hands, and fatigue    OSTEOPOROSIS Duration: about 6 months Satisfied with current treatment?: no Medication side effects: yes- blisters and rash on both boniva  and fosamax  Medication compliance: good compliance Past osteoporosis medications/treatments:  Adequate calcium & vitamin D: yes Intolerance to bisphosphonates:yes Weight bearing exercises: yes  URINARY SYMPTOMS Duration: chronic Dysuria: no Urinary frequency: yes Urgency: yes Small volume voids: no Symptom severity: no Urinary incontinence: yes Foul odor: no Hematuria: no Abdominal pain: no Back pain: no Suprapubic pain/pressure: no Flank pain: no Fever:  no Vomiting: no Relief with cranberry juice: no Relief with pyridium: no Status: worse Previous urinary tract infection: yes Recurrent urinary tract infection: no Sexual activity: No sexually active History of sexually transmitted disease: no Vaginal discharge: no Treatments attempted: oxybutynin     Relevant past medical, surgical, family and social history reviewed and updated as indicated. Interim medical history since our last visit reviewed. Allergies and medications reviewed and updated.  Review of Systems  Constitutional: Negative.   Respiratory: Negative.    Cardiovascular: Negative.   Gastrointestinal: Negative.   Genitourinary:  Positive for frequency and urgency. Negative for decreased urine volume, difficulty urinating, dyspareunia, dysuria, enuresis, flank pain, genital sores, hematuria, menstrual problem, pelvic pain,  vaginal bleeding, vaginal discharge and vaginal pain.  Psychiatric/Behavioral: Negative.      Per HPI unless specifically indicated above     Objective:    BP 131/66   Pulse 61   Temp (!) 97.3 F (36.3 C) (Oral)   Ht 5' 5 (1.651 m)   Wt 249 lb 12.8 oz (113.3 kg)   SpO2 99%   BMI 41.57 kg/m   Wt Readings from Last 3 Encounters:  11/28/24 249 lb 12.8 oz (113.3 kg)  09/27/24 250 lb (113.4 kg)  08/19/24 246 lb 9.6 oz (111.9 kg)    Physical Exam Vitals and nursing note reviewed.  Constitutional:      General: She is not in acute distress.    Appearance: Normal appearance. She is obese. She is not ill-appearing, toxic-appearing or diaphoretic.  HENT:     Head: Normocephalic and atraumatic.     Right Ear: External ear normal.     Left Ear: External ear normal.     Nose: Nose normal.     Mouth/Throat:     Mouth: Mucous membranes are moist.     Pharynx: Oropharynx is clear.  Eyes:     General: No scleral icterus.       Right eye: No discharge.        Left eye: No discharge.     Extraocular Movements: Extraocular movements intact.     Conjunctiva/sclera: Conjunctivae normal.     Pupils: Pupils are equal, round, and reactive to light.  Cardiovascular:     Rate and Rhythm: Normal rate and regular rhythm.     Pulses: Normal pulses.     Heart sounds: Normal heart sounds. No murmur heard.  No friction rub. No gallop.  Pulmonary:     Effort: Pulmonary effort is normal. No respiratory distress.     Breath sounds: Normal breath sounds. No stridor. No wheezing, rhonchi or rales.  Chest:     Chest wall: No tenderness.  Musculoskeletal:        General: Normal range of motion.     Cervical back: Normal range of motion and neck supple.  Skin:    General: Skin is warm and dry.     Capillary Refill: Capillary refill takes less than 2 seconds.     Coloration: Skin is not jaundiced or pale.     Findings: No bruising, erythema, lesion or rash.  Neurological:     General: No focal  deficit present.     Mental Status: She is alert and oriented to person, place, and time. Mental status is at baseline.  Psychiatric:        Mood and Affect: Mood normal.        Behavior: Behavior normal.        Thought Content: Thought content normal.        Judgment: Judgment normal.     Results for orders placed or performed in visit on 09/27/24  Microalbumin, Urine Waived   Collection Time: 09/27/24 10:48 AM  Result Value Ref Range   Microalb, Ur Waived 10 0 - 19 mg/L   Creatinine, Urine Waived 10 10 - 300 mg/dL   Microalb/Creat Ratio 30-300 (H) <30 mg/g  CBC with Differential/Platelet   Collection Time: 09/27/24 10:49 AM  Result Value Ref Range   WBC 4.8 3.4 - 10.8 x10E3/uL   RBC 3.41 (L) 3.77 - 5.28 x10E6/uL   Hemoglobin 11.5 11.1 - 15.9 g/dL   Hematocrit 65.6 65.9 - 46.6 %   MCV 101 (H) 79 - 97 fL   MCH 33.7 (H) 26.6 - 33.0 pg   MCHC 33.5 31.5 - 35.7 g/dL   RDW 88.6 (L) 88.2 - 84.5 %   Platelets 224 150 - 450 x10E3/uL   Neutrophils 60 Not Estab. %   Lymphs 26 Not Estab. %   Monocytes 8 Not Estab. %   Eos 4 Not Estab. %   Basos 2 Not Estab. %   Neutrophils Absolute 2.9 1.4 - 7.0 x10E3/uL   Lymphocytes Absolute 1.3 0.7 - 3.1 x10E3/uL   Monocytes Absolute 0.4 0.1 - 0.9 x10E3/uL   EOS (ABSOLUTE) 0.2 0.0 - 0.4 x10E3/uL   Basophils Absolute 0.1 0.0 - 0.2 x10E3/uL   Immature Granulocytes 0 Not Estab. %   Immature Grans (Abs) 0.0 0.0 - 0.1 x10E3/uL  Comprehensive metabolic panel with GFR   Collection Time: 09/27/24 10:49 AM  Result Value Ref Range   Glucose 107 (H) 70 - 99 mg/dL   BUN 26 8 - 27 mg/dL   Creatinine, Ser 8.91 (H) 0.57 - 1.00 mg/dL   eGFR 53 (L) >40 fO/fpw/8.26   BUN/Creatinine Ratio 24 12 - 28   Sodium 142 134 - 144 mmol/L   Potassium 4.1 3.5 - 5.2 mmol/L   Chloride 106 96 - 106 mmol/L   CO2 22 20 - 29 mmol/L   Calcium 9.4 8.7 - 10.3 mg/dL   Total Protein 6.4 6.0 - 8.5 g/dL   Albumin 3.8 3.8 - 4.8 g/dL   Globulin, Total 2.6 1.5 - 4.5 g/dL    Bilirubin Total 0.7 0.0 - 1.2 mg/dL   Alkaline Phosphatase 86 49 - 135 IU/L   AST 22 0 - 40 IU/L  ALT 19 0 - 32 IU/L  Lipid Panel w/o Chol/HDL Ratio   Collection Time: 09/27/24 10:49 AM  Result Value Ref Range   Cholesterol, Total 153 100 - 199 mg/dL   Triglycerides 96 0 - 149 mg/dL   HDL 51 >60 mg/dL   VLDL Cholesterol Cal 18 5 - 40 mg/dL   LDL Chol Calc (NIH) 84 0 - 99 mg/dL  TSH   Collection Time: 09/27/24 10:49 AM  Result Value Ref Range   TSH 2.100 0.450 - 4.500 uIU/mL      Assessment & Plan:   Problem List Items Addressed This Visit       Musculoskeletal and Integument   Osteoporosis - Primary   Unable to tolerate boniva  or fosamax . Would like to hold on other medications at this time- will repeat DEXA in 2027, if worse will refer to osteoporosis clinic.         Genitourinary   OAB (overactive bladder)   Not doing well on the short acting oxybutynin . UA did not show infection. Will change to ER and recheck in 6 weeks.         Other   Obesity, morbid (HCC)   Encouraged diet and exercise with goal of losing 1-2lbs per week. Call with any concerns.       Other Visit Diagnoses       Urinary frequency       Relevant Orders   Urinalysis, Routine w reflex microscopic        Follow up plan: Return in about 6 weeks (around 01/09/2025).      "

## 2024-11-28 NOTE — Assessment & Plan Note (Signed)
 Encouraged diet and exercise with goal of losing 1-2lbs per week. Call with any concerns.

## 2024-11-28 NOTE — Assessment & Plan Note (Signed)
 Not doing well on the short acting oxybutynin . UA did not show infection. Will change to ER and recheck in 6 weeks.

## 2024-11-28 NOTE — Assessment & Plan Note (Signed)
 Unable to tolerate boniva  or fosamax . Would like to hold on other medications at this time- will repeat DEXA in 2027, if worse will refer to osteoporosis clinic.

## 2024-12-17 ENCOUNTER — Other Ambulatory Visit: Payer: Self-pay | Admitting: Family Medicine

## 2025-01-12 ENCOUNTER — Ambulatory Visit: Admitting: Family Medicine

## 2025-03-23 ENCOUNTER — Ambulatory Visit
